# Patient Record
Sex: Male | Born: 1940 | Race: White | Hispanic: No | Marital: Married | Smoking: Never smoker
Health system: Southern US, Community
[De-identification: ages and names within clinical notes are randomized; demographics above are authoritative.]

## PROBLEM LIST (undated history)

## (undated) DIAGNOSIS — I82409 Acute embolism and thrombosis of unspecified deep veins of unspecified lower extremity: Secondary | ICD-10-CM

## (undated) DIAGNOSIS — C801 Malignant (primary) neoplasm, unspecified: Secondary | ICD-10-CM

## (undated) DIAGNOSIS — I4891 Unspecified atrial fibrillation: Secondary | ICD-10-CM

## (undated) DIAGNOSIS — Z77098 Contact with and (suspected) exposure to other hazardous, chiefly nonmedicinal, chemicals: Secondary | ICD-10-CM

## (undated) DIAGNOSIS — Z803 Family history of malignant neoplasm of breast: Secondary | ICD-10-CM

## (undated) DIAGNOSIS — Z800 Family history of malignant neoplasm of digestive organs: Secondary | ICD-10-CM

## (undated) DIAGNOSIS — I2510 Atherosclerotic heart disease of native coronary artery without angina pectoris: Secondary | ICD-10-CM

## (undated) DIAGNOSIS — Z8042 Family history of malignant neoplasm of prostate: Secondary | ICD-10-CM

## (undated) DIAGNOSIS — R0600 Dyspnea, unspecified: Secondary | ICD-10-CM

## (undated) DIAGNOSIS — M4800 Spinal stenosis, site unspecified: Secondary | ICD-10-CM

## (undated) DIAGNOSIS — M1990 Unspecified osteoarthritis, unspecified site: Secondary | ICD-10-CM

## (undated) DIAGNOSIS — C61 Malignant neoplasm of prostate: Secondary | ICD-10-CM

## (undated) DIAGNOSIS — G4730 Sleep apnea, unspecified: Secondary | ICD-10-CM

## (undated) DIAGNOSIS — I499 Cardiac arrhythmia, unspecified: Secondary | ICD-10-CM

## (undated) DIAGNOSIS — Z86010 Personal history of colonic polyps: Secondary | ICD-10-CM

## (undated) HISTORY — DX: Family history of malignant neoplasm of breast: Z80.3

## (undated) HISTORY — DX: Family history of malignant neoplasm of prostate: Z80.42

## (undated) HISTORY — DX: Contact with and (suspected) exposure to other hazardous, chiefly nonmedicinal, chemicals: Z77.098

## (undated) HISTORY — PX: KNEE ARTHROSCOPY: SUR90

## (undated) HISTORY — DX: Family history of malignant neoplasm of digestive organs: Z80.0

## (undated) HISTORY — PX: JOINT REPLACEMENT: SHX530

## (undated) HISTORY — PX: PENILE PROSTHESIS IMPLANT: SHX240

## (undated) HISTORY — DX: Unspecified atrial fibrillation: I48.91

## (undated) HISTORY — PX: HERNIA REPAIR: SHX51

## (undated) HISTORY — PX: BACK SURGERY: SHX140

## (undated) HISTORY — PX: PROSTATECTOMY: SHX69

## (undated) HISTORY — DX: Personal history of colonic polyps: Z86.010

## (undated) HISTORY — DX: Malignant neoplasm of prostate: C61

---

## 2000-05-12 ENCOUNTER — Ambulatory Visit (HOSPITAL_COMMUNITY): Admission: RE | Admit: 2000-05-12 | Discharge: 2000-05-12 | Payer: Self-pay | Admitting: Gastroenterology

## 2000-05-31 ENCOUNTER — Ambulatory Visit (HOSPITAL_COMMUNITY): Admission: RE | Admit: 2000-05-31 | Discharge: 2000-05-31 | Payer: Self-pay | Admitting: Gastroenterology

## 2000-06-13 ENCOUNTER — Ambulatory Visit (HOSPITAL_COMMUNITY): Admission: RE | Admit: 2000-06-13 | Discharge: 2000-06-13 | Payer: Self-pay | Admitting: Gastroenterology

## 2000-06-16 ENCOUNTER — Ambulatory Visit (HOSPITAL_COMMUNITY): Admission: RE | Admit: 2000-06-16 | Discharge: 2000-06-16 | Payer: Self-pay | Admitting: Gastroenterology

## 2000-06-16 ENCOUNTER — Encounter: Payer: Self-pay | Admitting: Gastroenterology

## 2000-07-05 DIAGNOSIS — C801 Malignant (primary) neoplasm, unspecified: Secondary | ICD-10-CM

## 2000-07-05 HISTORY — DX: Malignant (primary) neoplasm, unspecified: C80.1

## 2000-11-12 ENCOUNTER — Ambulatory Visit: Admission: RE | Admit: 2000-11-12 | Discharge: 2001-02-10 | Payer: Self-pay | Admitting: Radiation Oncology

## 2011-06-25 ENCOUNTER — Other Ambulatory Visit (HOSPITAL_COMMUNITY): Payer: Self-pay | Admitting: Orthopaedic Surgery

## 2011-07-01 ENCOUNTER — Encounter (HOSPITAL_COMMUNITY): Payer: Self-pay | Admitting: Pharmacy Technician

## 2011-07-02 ENCOUNTER — Ambulatory Visit (HOSPITAL_COMMUNITY)
Admission: RE | Admit: 2011-07-02 | Discharge: 2011-07-02 | Disposition: A | Discharge status: Home / Self Care | Payer: Medicare Other | Source: Ambulatory Visit | Attending: Orthopaedic Surgery | Admitting: Orthopaedic Surgery

## 2011-07-02 ENCOUNTER — Encounter (HOSPITAL_COMMUNITY): Payer: Self-pay

## 2011-07-02 DIAGNOSIS — Z01810 Encounter for preprocedural cardiovascular examination: Secondary | ICD-10-CM | POA: Insufficient documentation

## 2011-07-02 DIAGNOSIS — Z01818 Encounter for other preprocedural examination: Secondary | ICD-10-CM | POA: Insufficient documentation

## 2011-07-02 DIAGNOSIS — Z01812 Encounter for preprocedural laboratory examination: Secondary | ICD-10-CM | POA: Insufficient documentation

## 2011-07-02 HISTORY — DX: Unspecified osteoarthritis, unspecified site: M19.90

## 2011-07-02 HISTORY — DX: Malignant (primary) neoplasm, unspecified: C80.1

## 2011-07-02 HISTORY — DX: Spinal stenosis, site unspecified: M48.00

## 2011-07-02 LAB — CBC
HCT: 44.7 % (ref 39.0–52.0)
MCH: 31.1 pg (ref 26.0–34.0)
MCHC: 35.1 g/dL (ref 30.0–36.0)
MCV: 88.5 fL (ref 78.0–100.0)
RDW: 13 % (ref 11.5–15.5)

## 2011-07-02 LAB — BASIC METABOLIC PANEL
BUN: 34 mg/dL — ABNORMAL HIGH (ref 6–23)
CO2: 28 mEq/L (ref 19–32)
Calcium: 10.5 mg/dL (ref 8.4–10.5)
Chloride: 101 mEq/L (ref 96–112)
Creatinine, Ser: 1.16 mg/dL (ref 0.50–1.35)

## 2011-07-02 LAB — URINALYSIS, ROUTINE W REFLEX MICROSCOPIC
Bilirubin Urine: NEGATIVE
Glucose, UA: NEGATIVE mg/dL
Hgb urine dipstick: NEGATIVE
Nitrite: NEGATIVE
Specific Gravity, Urine: 1.025 (ref 1.005–1.030)
pH: 5 (ref 5.0–8.0)

## 2011-07-02 LAB — SURGICAL PCR SCREEN: Staphylococcus aureus: NEGATIVE

## 2011-07-02 LAB — ABO/RH: ABO/RH(D): O POS

## 2011-07-02 NOTE — Patient Instructions (Addendum)
[address]  07/02/2011   Your procedure is scheduled on:  07/08/11  Report to [facility] at 5:15 AM.  Call this number if you have problems the morning of surgery: [phone]   Remember:   Do not eat food:After Midnight.  May have clear liquids:until Midnight .  Clear liquids include soda, tea, black coffee, apple or grape juice, broth.  Take these medicines the morning of surgery with A SIP OF WATER: PROSCAR   Do not wear jewelry, make-up or nail polish.  Do not wear lotions, powders, or perfumes. You may wear deodorant.  Do not shave 48 hours prior to surgery.  Do not bring valuables to the hospital.  Contacts, dentures or bridgework may not be worn into surgery.  Leave suitcase in the car. After surgery it may be brought to your room.  For patients admitted to the hospital, checkout time is 11:00 AM the day of discharge.   Patients discharged the day of surgery will not be allowed to drive home.  Name and phone number of your driver:   Special Instructions: CHG Shower Use Special Wash: 1/2 bottle night before surgery and 1/2 bottle morning of surgery.   Please read over the following fact sheets that you were given: MRSA Information

## 2011-07-05 NOTE — Pre-Procedure Instructions (Signed)
Patient surgery moved up to 0700 on 07/08/2011,patient called to let him know of time change and for him to report to Short Stay at [facility] at 0500. Patient verbalized understanding.

## 2011-07-08 ENCOUNTER — Encounter (HOSPITAL_COMMUNITY): Payer: Self-pay | Admitting: Certified Registered Nurse Anesthetist

## 2011-07-08 ENCOUNTER — Encounter (HOSPITAL_COMMUNITY)
Admission: RE | Disposition: A | Discharge status: Home Health | Payer: Self-pay | Source: Ambulatory Visit | Attending: Orthopaedic Surgery

## 2011-07-08 ENCOUNTER — Inpatient Hospital Stay (HOSPITAL_COMMUNITY): Payer: Medicare Other | Admitting: Certified Registered Nurse Anesthetist

## 2011-07-08 ENCOUNTER — Inpatient Hospital Stay (HOSPITAL_COMMUNITY): Payer: Medicare Other

## 2011-07-08 ENCOUNTER — Encounter (HOSPITAL_COMMUNITY): Payer: Self-pay | Admitting: *Deleted

## 2011-07-08 ENCOUNTER — Inpatient Hospital Stay (HOSPITAL_COMMUNITY)
Admission: RE | Admit: 2011-07-08 | Discharge: 2011-07-11 | DRG: 470 | Disposition: A | Discharge status: Home Health | Payer: Medicare Other | Source: Ambulatory Visit | Attending: Orthopaedic Surgery | Admitting: Orthopaedic Surgery

## 2011-07-08 DIAGNOSIS — Z8546 Personal history of malignant neoplasm of prostate: Secondary | ICD-10-CM

## 2011-07-08 DIAGNOSIS — M1610 Unilateral primary osteoarthritis, unspecified hip: Principal | ICD-10-CM | POA: Diagnosis present

## 2011-07-08 DIAGNOSIS — M169 Osteoarthritis of hip, unspecified: Secondary | ICD-10-CM

## 2011-07-08 HISTORY — PX: TOTAL HIP ARTHROPLASTY: SHX124

## 2011-07-08 SURGERY — ARTHROPLASTY, HIP, TOTAL, ANTERIOR APPROACH
Anesthesia: General | Site: Hip | Laterality: Right | Wound class: Clean

## 2011-07-08 MED ORDER — ONDANSETRON HCL 4 MG/2ML IJ SOLN
INTRAMUSCULAR | Status: DC | PRN
  Administered 2011-07-08: 4 mg via INTRAVENOUS

## 2011-07-08 MED ORDER — NALOXONE HCL 0.4 MG/ML IJ SOLN: 0.4000 mg | INTRAMUSCULAR | Status: DC | PRN

## 2011-07-08 MED ORDER — SENNA 8.6 MG PO TABS
1.0000 | ORAL_TABLET | Freq: Two times a day (BID) | ORAL | Status: DC
  Administered 2011-07-08 – 2011-07-11 (×7): 8.6 mg via ORAL
  Filled 2011-07-08 (×7): qty 1

## 2011-07-08 MED ORDER — CEFAZOLIN SODIUM-DEXTROSE 2-3 GM-% IV SOLR
2.0000 g | INTRAVENOUS | Status: AC
  Administered 2011-07-08: 2 g via INTRAVENOUS

## 2011-07-08 MED ORDER — ACETAMINOPHEN 10 MG/ML IV SOLN
INTRAVENOUS | Status: DC | PRN
  Administered 2011-07-08: 1000 mg via INTRAVENOUS

## 2011-07-08 MED ORDER — LACTATED RINGERS IV SOLN
INTRAVENOUS | Status: DC | PRN
  Administered 2011-07-08 (×2): via INTRAVENOUS

## 2011-07-08 MED ORDER — MORPHINE SULFATE (PF) 1 MG/ML IV SOLN
INTRAVENOUS | Status: DC
  Administered 2011-07-08: 1.5 mg via INTRAVENOUS
  Administered 2011-07-09: 4.5 mg via INTRAVENOUS
  Administered 2011-07-09 (×2): 1.5 mg via INTRAVENOUS
  Administered 2011-07-09: 4.5 mg via INTRAVENOUS
  Administered 2011-07-10: 1.5 mg via INTRAVENOUS
  Filled 2011-07-08: qty 25

## 2011-07-08 MED ORDER — ONDANSETRON HCL 4 MG/2ML IJ SOLN: 4.0000 mg | Freq: Four times a day (QID) | INTRAMUSCULAR | Status: DC | PRN

## 2011-07-08 MED ORDER — MIDAZOLAM HCL 5 MG/5ML IJ SOLN
INTRAMUSCULAR | Status: DC | PRN
  Administered 2011-07-08: 2 mg via INTRAVENOUS

## 2011-07-08 MED ORDER — MENTHOL 3 MG MT LOZG
1.0000 | LOZENGE | OROMUCOSAL | Status: DC | PRN
  Filled 2011-07-08: qty 9

## 2011-07-08 MED ORDER — SODIUM CHLORIDE 0.9 % IV SOLN
INTRAVENOUS | Status: DC
  Administered 2011-07-08 – 2011-07-10 (×4): via INTRAVENOUS

## 2011-07-08 MED ORDER — METHOCARBAMOL 100 MG/ML IJ SOLN
500.0000 mg | Freq: Four times a day (QID) | INTRAVENOUS | Status: DC | PRN
  Filled 2011-07-08: qty 5

## 2011-07-08 MED ORDER — 0.9 % SODIUM CHLORIDE (POUR BTL) OPTIME
TOPICAL | Status: DC | PRN
  Administered 2011-07-08: 1000 mL

## 2011-07-08 MED ORDER — FENTANYL CITRATE 0.05 MG/ML IJ SOLN
INTRAMUSCULAR | Status: DC | PRN
  Administered 2011-07-08 (×5): 50 ug via INTRAVENOUS

## 2011-07-08 MED ORDER — SODIUM CHLORIDE 0.9 % IJ SOLN: 9.0000 mL | INTRAMUSCULAR | Status: DC | PRN

## 2011-07-08 MED ORDER — ACETAMINOPHEN 650 MG RE SUPP: 650.0000 mg | Freq: Four times a day (QID) | RECTAL | Status: DC | PRN

## 2011-07-08 MED ORDER — ONDANSETRON HCL 4 MG PO TABS: 4.0000 mg | ORAL_TABLET | Freq: Four times a day (QID) | ORAL | Status: DC | PRN

## 2011-07-08 MED ORDER — HYDROMORPHONE HCL PF 1 MG/ML IJ SOLN: 0.2500 mg | INTRAMUSCULAR | Status: DC | PRN

## 2011-07-08 MED ORDER — DEXAMETHASONE SODIUM PHOSPHATE 10 MG/ML IJ SOLN
INTRAMUSCULAR | Status: DC | PRN
  Administered 2011-07-08: 10 mg via INTRAVENOUS

## 2011-07-08 MED ORDER — HYDROCODONE-ACETAMINOPHEN 5-325 MG PO TABS
1.0000 | ORAL_TABLET | ORAL | Status: DC | PRN
  Administered 2011-07-09: 1 via ORAL
  Filled 2011-07-08: qty 1

## 2011-07-08 MED ORDER — EPHEDRINE SULFATE 50 MG/ML IJ SOLN
INTRAMUSCULAR | Status: DC | PRN
  Administered 2011-07-08: 5 mg via INTRAVENOUS

## 2011-07-08 MED ORDER — ZOLPIDEM TARTRATE 5 MG PO TABS: 5.0000 mg | ORAL_TABLET | Freq: Every evening | ORAL | Status: DC | PRN

## 2011-07-08 MED ORDER — MEPERIDINE HCL 50 MG/ML IJ SOLN: 6.2500 mg | INTRAMUSCULAR | Status: DC | PRN

## 2011-07-08 MED ORDER — MORPHINE SULFATE 2 MG/ML IJ SOLN: 1.0000 mg | INTRAMUSCULAR | Status: DC | PRN

## 2011-07-08 MED ORDER — ACETAMINOPHEN 325 MG PO TABS: 650.0000 mg | ORAL_TABLET | Freq: Four times a day (QID) | ORAL | Status: DC | PRN

## 2011-07-08 MED ORDER — METHOCARBAMOL 500 MG PO TABS: 500.0000 mg | ORAL_TABLET | Freq: Four times a day (QID) | ORAL | Status: DC | PRN

## 2011-07-08 MED ORDER — OXYCODONE HCL 5 MG PO TABS: 5.0000 mg | ORAL_TABLET | ORAL | Status: DC | PRN

## 2011-07-08 MED ORDER — LACTATED RINGERS IV SOLN: INTRAVENOUS | Status: DC

## 2011-07-08 MED ORDER — ROCURONIUM BROMIDE 100 MG/10ML IV SOLN
INTRAVENOUS | Status: DC | PRN
  Administered 2011-07-08: 20 mg via INTRAVENOUS
  Administered 2011-07-08: 50 mg via INTRAVENOUS
  Administered 2011-07-08: 10 mg via INTRAVENOUS

## 2011-07-08 MED ORDER — METOCLOPRAMIDE HCL 10 MG PO TABS: 5.0000 mg | ORAL_TABLET | Freq: Three times a day (TID) | ORAL | Status: DC | PRN

## 2011-07-08 MED ORDER — ALUM & MAG HYDROXIDE-SIMETH 200-200-20 MG/5ML PO SUSP: 30.0000 mL | ORAL | Status: DC | PRN

## 2011-07-08 MED ORDER — NEOSTIGMINE METHYLSULFATE 1 MG/ML IJ SOLN
INTRAMUSCULAR | Status: DC | PRN
  Administered 2011-07-08: 5 mg via INTRAVENOUS

## 2011-07-08 MED ORDER — PROPOFOL 10 MG/ML IV EMUL
INTRAVENOUS | Status: DC | PRN
  Administered 2011-07-08: 150 mg via INTRAVENOUS

## 2011-07-08 MED ORDER — METOCLOPRAMIDE HCL 5 MG/ML IJ SOLN: 5.0000 mg | Freq: Three times a day (TID) | INTRAMUSCULAR | Status: DC | PRN

## 2011-07-08 MED ORDER — GLYCOPYRROLATE 0.2 MG/ML IJ SOLN
INTRAMUSCULAR | Status: DC | PRN
  Administered 2011-07-08: .8 mg via INTRAVENOUS

## 2011-07-08 MED ORDER — CEFAZOLIN SODIUM 1-5 GM-% IV SOLN
1.0000 g | Freq: Four times a day (QID) | INTRAVENOUS | Status: AC
  Administered 2011-07-08 – 2011-07-09 (×3): 1 g via INTRAVENOUS
  Filled 2011-07-08 (×3): qty 50

## 2011-07-08 MED ORDER — FERROUS SULFATE 325 (65 FE) MG PO TABS
325.0000 mg | ORAL_TABLET | Freq: Three times a day (TID) | ORAL | Status: DC
  Administered 2011-07-08 – 2011-07-11 (×9): 325 mg via ORAL
  Filled 2011-07-08 (×11): qty 1

## 2011-07-08 MED ORDER — PHENOL 1.4 % MT LIQD: 1.0000 | OROMUCOSAL | Status: DC | PRN

## 2011-07-08 MED ORDER — LIDOCAINE HCL (CARDIAC) 20 MG/ML IV SOLN
INTRAVENOUS | Status: DC | PRN
  Administered 2011-07-08: 70 mg via INTRAVENOUS

## 2011-07-08 MED ORDER — FINASTERIDE 5 MG PO TABS
5.0000 mg | ORAL_TABLET | Freq: Every day | ORAL | Status: DC
  Administered 2011-07-09 – 2011-07-11 (×3): 5 mg via ORAL
  Filled 2011-07-08 (×4): qty 1

## 2011-07-08 MED ORDER — RIVAROXABAN 10 MG PO TABS
10.0000 mg | ORAL_TABLET | Freq: Every day | ORAL | Status: DC
  Administered 2011-07-09 – 2011-07-11 (×3): 10 mg via ORAL
  Filled 2011-07-08 (×3): qty 1

## 2011-07-08 MED ORDER — DIPHENHYDRAMINE HCL 50 MG/ML IJ SOLN: 12.5000 mg | Freq: Four times a day (QID) | INTRAMUSCULAR | Status: DC | PRN

## 2011-07-08 MED ORDER — PROMETHAZINE HCL 25 MG/ML IJ SOLN: 6.2500 mg | INTRAMUSCULAR | Status: DC | PRN

## 2011-07-08 MED ORDER — DIPHENHYDRAMINE HCL 12.5 MG/5ML PO ELIX: 12.5000 mg | ORAL_SOLUTION | ORAL | Status: DC | PRN

## 2011-07-08 MED ORDER — DIPHENHYDRAMINE HCL 12.5 MG/5ML PO ELIX: 12.5000 mg | ORAL_SOLUTION | Freq: Four times a day (QID) | ORAL | Status: DC | PRN

## 2011-07-08 MED ORDER — HYDROMORPHONE HCL PF 1 MG/ML IJ SOLN
INTRAMUSCULAR | Status: DC | PRN
  Administered 2011-07-08 (×2): 0.5 mg via INTRAVENOUS
  Administered 2011-07-08: 1 mg via INTRAVENOUS

## 2011-07-08 SURGICAL SUPPLY — 38 items
BAG SPEC THK2 15X12 ZIP CLS (MISCELLANEOUS) ×2
BAG ZIPLOCK 12X15 (MISCELLANEOUS) ×4 IMPLANT
BLADE SAW SGTL 18X1.27X75 (BLADE) ×2 IMPLANT
CELLS DAT CNTRL 66122 CELL SVR (MISCELLANEOUS) ×1 IMPLANT
CLOTH BEACON ORANGE TIMEOUT ST (SAFETY) ×2 IMPLANT
DRAPE C-ARM 42X72 X-RAY (DRAPES) ×2 IMPLANT
DRAPE STERI IOBAN 125X83 (DRAPES) ×2 IMPLANT
DRAPE U-SHAPE 47X51 STRL (DRAPES) ×6 IMPLANT
DRSG MEPILEX BORDER 4X8 (GAUZE/BANDAGES/DRESSINGS) ×2 IMPLANT
DURAPREP 26ML APPLICATOR (WOUND CARE) ×2 IMPLANT
ELECT BLADE TIP CTD 4 INCH (ELECTRODE) ×2 IMPLANT
ELECT REM PT RETURN 9FT ADLT (ELECTROSURGICAL) ×2
ELECTRODE REM PT RTRN 9FT ADLT (ELECTROSURGICAL) ×1 IMPLANT
EVACUATOR 1/8 PVC DRAIN (DRAIN) IMPLANT
FACESHIELD LNG OPTICON STERILE (SAFETY) ×8 IMPLANT
GAUZE XEROFORM 1X8 LF (GAUZE/BANDAGES/DRESSINGS) ×2 IMPLANT
GAUZE XEROFORM 4X4 STRL (GAUZE/BANDAGES/DRESSINGS) ×1 IMPLANT
GLOVE BIO SURGEON STRL SZ7 (GLOVE) ×2 IMPLANT
GLOVE BIO SURGEON STRL SZ7.5 (GLOVE) ×2 IMPLANT
GLOVE BIOGEL PI IND STRL 7.5 (GLOVE) IMPLANT
GLOVE BIOGEL PI IND STRL 8 (GLOVE) ×1 IMPLANT
GLOVE BIOGEL PI INDICATOR 7.5 (GLOVE)
GLOVE BIOGEL PI INDICATOR 8 (GLOVE) ×1
GLOVE ECLIPSE 7.0 STRL STRAW (GLOVE) ×2 IMPLANT
GOWN STRL REIN XL XLG (GOWN DISPOSABLE) ×4 IMPLANT
KIT BASIN OR (CUSTOM PROCEDURE TRAY) ×2 IMPLANT
PACK TOTAL JOINT (CUSTOM PROCEDURE TRAY) ×2 IMPLANT
PADDING CAST COTTON 6X4 STRL (CAST SUPPLIES) ×2 IMPLANT
RETRACTOR WND ALEXIS 18 MED (MISCELLANEOUS) ×1 IMPLANT
RTRCTR WOUND ALEXIS 18CM MED (MISCELLANEOUS) ×2
STAPLER SKIN PROX WIDE 3.9 (STAPLE) IMPLANT
SUT ETHIBOND NAB CT1 #1 30IN (SUTURE) ×4 IMPLANT
SUT VIC AB 1 CT1 36 (SUTURE) ×4 IMPLANT
SUT VIC AB 2-0 CT1 27 (SUTURE) ×4
SUT VIC AB 2-0 CT1 TAPERPNT 27 (SUTURE) ×2 IMPLANT
TOWEL OR 17X26 10 PK STRL BLUE (TOWEL DISPOSABLE) ×4 IMPLANT
TOWEL OR NON WOVEN STRL DISP B (DISPOSABLE) ×2 IMPLANT
TRAY FOLEY CATH 14FRSI W/METER (CATHETERS) ×2 IMPLANT

## 2011-07-08 NOTE — Brief Op Note (Signed)
07/08/2011  9:07 AM  PATIENT:  Margorie John  71 y.o. male  PRE-OPERATIVE DIAGNOSIS:  severe osteoarthritis right hip  POST-OPERATIVE DIAGNOSIS:  severe osteoarthritis right hip  PROCEDURE:  Procedure(s): TOTAL HIP ARTHROPLASTY ANTERIOR APPROACH  SURGEON:  Surgeon(s): Kathryne Hitch  PHYSICIAN ASSISTANT:   ASSISTANTS: none   ANESTHESIA:   general  EBL:  Total I/O In: 1000 [I.V.:1000] Out: 450 [Blood:450]  BLOOD ADMINISTERED:none  DRAINS: none   LOCAL MEDICATIONS USED:  NONE  SPECIMEN:  No Specimen  DISPOSITION OF SPECIMEN:  N/A  COUNTS:  YES  TOURNIQUET:  * No tourniquets in log *  DICTATION: .Other Dictation: Dictation Number [id number]  PLAN OF CARE: Admit to inpatient   PATIENT DISPOSITION:  PACU - hemodynamically stable.   Delay start of Pharmacological VTE agent (>24hrs) due to surgical blood loss or risk of bleeding:  {YES/NO/NOT APPLICABLE:20182

## 2011-07-08 NOTE — H&P (Signed)
Connor Duffy is an 71 y.o. male.   Chief Complaint:   Right hip pain with known end-stage osteoarthritis HPI: 71 yo male with severe right hip pain.  X-rays show bone-on-bone wear of his hip with significant DJD.  With the failure of conservative treatment including injections, NSAIDs, rest and time, he wishes to proceed with a right hip replacement.  He understands the risks of blood loss, DVT and PE.  The goal is to decrease his pain and increase his mobility and quality of life.  Past Medical History  Diagnosis Date  . Arthritis   . Cancer     PROSTATE  . Spinal stenosis     Past Surgical History  Procedure Date  . Prostatectomy   . Penile prosthesis implant   . Knee arthroscopy LEFT    History reviewed. No pertinent family history. Social History:  reports that he has never smoked. He does not have any smokeless tobacco history on file. He reports that he drinks alcohol. He reports that he does not use illicit drugs.  Allergies: No Known Allergies  Medications Prior to Admission  Medication Dose Route Frequency Provider Last Rate Last Dose  . ceFAZolin (ANCEF) IVPB 2 g/50 mL premix  2 g Intravenous 60 min Pre-Op Kathryne Hitch       No current outpatient prescriptions on file as of 07/08/2011.    No results found for this or any previous visit (from the past 48 hour(s)). No results found.  Review of Systems  All other systems reviewed and are negative.    Blood pressure 147/94, pulse 67, temperature 97.6 F (36.4 C), temperature source Oral, resp. rate 16, SpO2 98.00%. Physical Exam  Constitutional: He is oriented to person, place, and time. He appears well-developed and well-nourished.  HENT:  Head: Normocephalic and atraumatic.  Eyes: EOM are normal. Pupils are equal, round, and reactive to light.  Neck: Normal range of motion. Neck supple.  Cardiovascular: Normal rate and regular rhythm.   Respiratory: Effort normal and breath sounds normal.  GI: Soft.  Bowel sounds are normal.  Musculoskeletal:       Right hip: He exhibits decreased range of motion, bony tenderness and crepitus.  Neurological: He is alert and oriented to person, place, and time.  Skin: Skin is warm and dry.  Psychiatric: He has a normal mood and affect.     Assessment/Plan To the OR today for a right total hip replacement and then admission as an inpatient.  Sharley Keeler Y 07/08/2011, 6:53 AM

## 2011-07-08 NOTE — Anesthesia Postprocedure Evaluation (Signed)
  Anesthesia Post-op Note  Patient: Connor Duffy  Procedure(s) Performed:  TOTAL HIP ARTHROPLASTY ANTERIOR APPROACH - Right Total Hip Arthroplasty, Direct Anterior Approach   (c-arm)  Patient Location: PACU  Anesthesia Type: General  Level of Consciousness: awake and alert   Airway and Oxygen Therapy: Patient Spontanous Breathing  Post-op Pain: mild  Post-op Assessment: Post-op Vital signs reviewed, Patient's Cardiovascular Status Stable, Respiratory Function Stable, Patent Airway and No signs of Nausea or vomiting  Post-op Vital Signs: stable  Complications: No apparent anesthesia complications

## 2011-07-08 NOTE — Transfer of Care (Signed)
Immediate Anesthesia Transfer of Care Note  Patient: Connor Duffy  Procedure(s) Performed:  TOTAL HIP ARTHROPLASTY ANTERIOR APPROACH - Right Total Hip Arthroplasty, Direct Anterior Approach   (c-arm)  Patient Location: PACU  Anesthesia Type: General  Level of Consciousness: sedated, patient cooperative and responds to stimulaton  Airway & Oxygen Therapy: Patient Spontanous Breathing and Patient connected to face mask oxgen  Post-op Assessment: Report given to PACU RN and Post -op Vital signs reviewed and stable  Post vital signs: Reviewed and stable  Complications: No apparent anesthesia complications

## 2011-07-08 NOTE — Plan of Care (Signed)
Problem: Phase I Progression Outcomes Goal: Pain controlled with appropriate interventions Outcome: Completed/Met Date Met:  07/08/11 Pt has PCA.

## 2011-07-08 NOTE — Progress Notes (Signed)
Utilization review completed.  

## 2011-07-08 NOTE — Anesthesia Preprocedure Evaluation (Addendum)
Anesthesia Evaluation  Patient identified by MRN, date of birth, ID band Patient awake    Reviewed: Allergy & Precautions, H&P , NPO status , Patient's Chart, lab work & pertinent test results, reviewed documented beta blocker date and time   Airway Mallampati: II TM Distance: >3 FB Neck ROM: full    Dental No notable dental hx.    Pulmonary neg pulmonary ROS,  clear to auscultation  Pulmonary exam normal       Cardiovascular Exercise Tolerance: Good neg cardio ROS regular Normal    Neuro/Psych Negative Neurological ROS  Negative Psych ROS   GI/Hepatic negative GI ROS, Neg liver ROS,   Endo/Other  Negative Endocrine ROS  Renal/GU negative Renal ROS  Genitourinary negative   Musculoskeletal   Abdominal   Peds  Hematology negative hematology ROS (+)   Anesthesia Other Findings   Reproductive/Obstetrics negative OB ROS                           Anesthesia Physical Anesthesia Plan  ASA: II  Anesthesia Plan: General   Post-op Pain Management:    Induction:   Airway Management Planned: Oral ETT  Additional Equipment:   Intra-op Plan:   Post-operative Plan:   Informed Consent: I have reviewed the patients History and Physical, chart, labs and discussed the procedure including the risks, benefits and alternatives for the proposed anesthesia with the patient or authorized representative who has indicated his/her understanding and acceptance.   Dental Advisory Given  Plan Discussed with: CRNA  Anesthesia Plan Comments:        Anesthesia Quick Evaluation

## 2011-07-09 LAB — CBC
MCH: 30.9 pg (ref 26.0–34.0)
Platelets: 183 10*3/uL (ref 150–400)
RBC: 4.01 MIL/uL — ABNORMAL LOW (ref 4.22–5.81)
RDW: 13.3 % (ref 11.5–15.5)

## 2011-07-09 LAB — BASIC METABOLIC PANEL
CO2: 27 mEq/L (ref 19–32)
Calcium: 8.5 mg/dL (ref 8.4–10.5)
GFR calc Af Amer: 82 mL/min — ABNORMAL LOW (ref >90)
GFR calc non Af Amer: 71 mL/min — ABNORMAL LOW (ref >90)
Sodium: 131 mEq/L — ABNORMAL LOW (ref 135–145)

## 2011-07-09 NOTE — Progress Notes (Signed)
Physical Therapy Treatment Patient Details Name: Connor Duffy MRN: [id number] DOB: 1941-04-25 Today's Date: 07/09/2011 1327 - 1346; GT PT Assessment/Plan  PT - Assessment/Plan PT Plan: Discharge plan remains appropriate PT Frequency: 7X/week Follow Up Recommendations: Home health PT Equipment Recommended: Rolling walker with 5" wheels PT Goals  Acute Rehab PT Goals PT Goal Formulation: With patient Time For Goal Achievement: 7 days Pt will go Supine/Side to Sit: with supervision Pt will go Sit to Supine/Side: with supervision PT Goal: Sit to Supine/Side - Progress: Progressing toward goal Pt will go Sit to Stand: with supervision PT Goal: Sit to Stand - Progress: Progressing toward goal Pt will go Stand to Sit: with supervision PT Goal: Stand to Sit - Progress: Progressing toward goal Pt will Ambulate: 51 - 150 feet;with supervision PT Goal: Ambulate - Progress: Progressing toward goal Pt will Go Up / Down Stairs: 6-9 stairs;with min assist;with least restrictive assistive device PT Goal: Up/Down Stairs - Progress: Not met  PT Treatment Precautions/Restrictions  Restrictions Other Position/Activity Restrictions: WBAT Mobility (including Balance) Bed Mobility Sit to Supine - Right: 4: Min assist Sit to Supine - Right Details (indicate cue type and reason): pt sel-assisting R LE with LE LE Transfers Sit to Stand: 4: Min assist Sit to Stand Details (indicate cue type and reason): cues for use of UEs and for LE position Stand to Sit: 4: Min assist;5: Supervision Stand to Sit Details: cues for use of UEs and for LE position Ambulation/Gait Ambulation/Gait Assistance: 4: Min assist Ambulation/Gait Assistance Details (indicate cue type and reason): cues for sequence and position from RW Ambulation Distance (Feet): 200 Feet Assistive device: Rolling walker Gait Pattern: Step-to pattern    Exercise    End of Session PT - End of Session Activity Tolerance: Patient tolerated  treatment well Patient left: with call bell in reach;in bed Nurse Communication: Mobility status for ambulation;Mobility status for transfers General Behavior During Session: [facility] for tasks performed  Connor Duffy 07/09/2011, 3:42 PM

## 2011-07-09 NOTE — Progress Notes (Signed)
07/09/2011 Connor Duffy BSN CCM [phone] Pt with dx osteoarthritis rt hip; anterior hip replacemnt on day of admission CM SPOKE WITH PATIENT AND SPOUSE. Plans are for patient to return to his home in [place] where spouse will be caregiver. Already has cane and crutches. Has chosen [place] if hh services are required. CM will follow for dme and HH orders.

## 2011-07-09 NOTE — Progress Notes (Signed)
Subjective: 1 Day Post-Op Procedure(s) (LRB): TOTAL HIP ARTHROPLASTY ANTERIOR APPROACH (Right) Patient reports pain as mild.    Objective: Vital signs in last 24 hours: Temp:  [94.5 F (34.7 C)-99.1 F (37.3 C)] 97.9 F (36.6 C) (01/04 0625) Pulse Rate:  [53-82] 63  (01/04 0625) Resp:  [13-20] 16  (01/04 0625) BP: (121-150)/(64-79) 129/70 mmHg (01/04 0625) SpO2:  [96 %-100 %] 99 % (01/04 0625) Weight:  [100.1 kg (220 lb 10.9 oz)] 220 lb 10.9 oz (100.1 kg) (01/03 1254)  Intake/Output from previous day: 01/03 0701 - 01/04 0700 In: 2771.3 [P.O.:480; I.V.:2291.3] Out: 1475 [Urine:1025; Blood:450] Intake/Output this shift: Total I/O In: 240 [P.O.:240] Out: -    Basename 07/09/11 0444  HGB 12.4*    Basename 07/09/11 0444  WBC 9.5  RBC 4.01*  HCT 35.8*  PLT 183    Basename 07/09/11 0444  NA 131*  K 4.0  CL 97  CO2 27  BUN 23  CREATININE 1.05  GLUCOSE 142*  CALCIUM 8.5   No results found for this basename: LABPT:2,INR:2 in the last 72 hours  Sensation intact distally Intact pulses distally Dorsiflexion/Plantar flexion intact Incision: scant drainage  Assessment/Plan: 1 Day Post-Op Procedure(s) (LRB): TOTAL HIP ARTHROPLASTY ANTERIOR APPROACH (Right) Up with therapy  Errika Narvaiz Y 07/09/2011, 10:07 AM

## 2011-07-09 NOTE — Progress Notes (Signed)
Physical Therapy Evaluation Patient Details Name: Connor Duffy MRN: [id number] DOB: 1941/01/19 Today's Date: 07/09/2011 0847 - 0920; EVAL Problem List:  Patient Active Problem List  Diagnoses  . Degenerative arthritis of hip    Past Medical History:  Past Medical History  Diagnosis Date  . Arthritis   . Cancer     PROSTATE  . Spinal stenosis    Past Surgical History:  Past Surgical History  Procedure Date  . Prostatectomy   . Penile prosthesis implant   . Knee arthroscopy LEFT    PT Assessment/Plan/Recommendation PT Assessment Clinical Impression Statement: Pt with L THR presents with decreased L LE strength/ROM and functiional mobility limitations.  Pt will benefit from skilled PT intervention to maximize IND for d/c home. PT Recommendation/Assessment: Patient will need skilled PT in the acute care venue PT Problem List: Decreased strength;Decreased range of motion;Decreased activity tolerance;Decreased mobility;Decreased knowledge of use of DME;Pain PT Therapy Diagnosis : Difficulty walking PT Plan PT Frequency: 7X/week PT Treatment/Interventions: DME instruction;Gait training;Stair training;Therapeutic exercise;Therapeutic activities;Functional mobility training;Patient/family education PT Recommendation Recommendations for Other Services: OT consult Follow Up Recommendations: Home health PT Equipment Recommended: Rolling walker with 5" wheels PT Goals  Acute Rehab PT Goals PT Goal Formulation: With patient Time For Goal Achievement: 7 days Pt will go Supine/Side to Sit: with supervision PT Goal: Supine/Side to Sit - Progress: Progressing toward goal Pt will go Sit to Supine/Side: with supervision PT Goal: Sit to Supine/Side - Progress: Not met Pt will go Sit to Stand: with supervision PT Goal: Sit to Stand - Progress: Progressing toward goal Pt will go Stand to Sit: with supervision PT Goal: Stand to Sit - Progress: Progressing toward goal Pt will Ambulate: 51 -  150 feet;with supervision PT Goal: Ambulate - Progress: Progressing toward goal Pt will Go Up / Down Stairs: 6-9 stairs;with min assist;with least restrictive assistive device (4 stairs outside, 8 stairs inside with railing) PT Goal: Up/Down Stairs - Progress: Not met  PT Evaluation Precautions/Restrictions  Restrictions Other Position/Activity Restrictions: WBAT Prior Functioning  Home Living Lives With: Spouse Receives Help From: Family Type of Home: House Home Layout: Two level Alternate Level Stairs-Rails: Left Alternate Level Stairs-Number of Steps: 8 Home Access: Stairs to enter Entrance Stairs-Rails: Can reach both Entrance Stairs-Number of Steps: 4 Prior Function Level of Independence: Independent with basic ADLs;Independent with transfers;Independent with gait Able to Take Stairs?: Yes Cognition Cognition Arousal/Alertness: Awake/alert Overall Cognitive Status: Appears within functional limits for tasks assessed Orientation Level: Oriented X4 Sensation/Coordination Coordination Gross Motor Movements are Fluid and Coordinated: Yes Extremity Assessment RUE Assessment RUE Assessment: Within Functional Limits LUE Assessment LUE Assessment: Within Functional Limits RLE Assessment RLE Assessment: Exceptions to [facility] (2+/5 hip; 3+/5 quads) LLE Assessment LLE Assessment: Within Functional Limits Mobility (including Balance) Bed Mobility Bed Mobility: Yes Supine to Sit: 4: Min assist Transfers Transfers: Yes Sit to Stand: 4: Min assist Sit to Stand Details (indicate cue type and reason): cues for use of UEs and for LE position Stand to Sit: 4: Min assist Stand to Sit Details: cues for use of UEs and for LE position Ambulation/Gait Ambulation/Gait: Yes Ambulation/Gait Assistance: 4: Min assist Ambulation/Gait Assistance Details (indicate cue type and reason): cues for posture, sequence and position from RW Ambulation Distance (Feet): 68 Feet Assistive device:  Rolling walker Gait Pattern: Step-to pattern    Exercise  Total Joint Exercises Ankle Circles/Pumps: AROM;10 reps;Supine;Both Quad Sets: AROM;10 reps;Supine;Both Heel Slides: AAROM;10 reps;Supine;Left Hip ABduction/ADduction: AAROM;10 reps;Left;Supine End of Session PT - End  of Session Activity Tolerance: Patient tolerated treatment well Patient left: in chair;with call bell in reach Nurse Communication: Mobility status for ambulation;Mobility status for transfers General Behavior During Session: [facility] for tasks performed  Connor Duffy 07/09/2011, 12:40 PM

## 2011-07-09 NOTE — Op Note (Signed)
NAMELUDWIN, FLAHIVE NO.:  [id number]  MEDICAL RECORD NO.:  [id number]  LOCATION:  1608                         FACILITY:  [facility]  PHYSICIAN:  Vanita Panda. Magnus Ivan, M.D.DATE OF BIRTH:  Oct 31, 1940  DATE OF PROCEDURE:  07/08/2011 DATE OF DISCHARGE:                              OPERATIVE REPORT   PREOPERATIVE DIAGNOSES:  End-stage osteoarthritis and degenerative joint disease, right hip.  POSTOPERATIVE DIAGNOSES:  End-stage osteoarthritis and degenerative joint disease, right hip.  PROCEDURE:  Right total hip arthroplasty through direct anterior approach.  IMPLANTS:  DePuy Sector Gription acetabular component size 54, size 36 +4 neutral polyethylene liner, size 12 Corail femoral component with standard offset, size 36 -2 metal hip ball.  SURGEON:  Vanita Panda. Magnus Ivan, MD  ANESTHESIA:  General.  ANTIBIOTICS:  2 g IV Ancef.  BLOOD LOSS:  450 cc.  COMPLICATIONS:  None.  INDICATIONS:  Mr. Noll is a 71 year old active individual with end- stage arthritis involving his right hip.  He has radiographic evidence of bone on bone wear.  He has tried steroid injections in his hip, antiinflammatory dressing, it has gotten to where it affects his activities of daily living and it has become a debilitating type of pain for him.  He wishes to proceed with a total hip arthroplasty.  The risks and benefits of this have been explained to him in detail and he does wish to proceed with surgery.  PROCEDURE DESCRIPTION:  After informed consent was obtained, appropriate right hip was marked.  He was brought to the operating room and general anesthesia was obtained while he was on the stretcher.  We attempted to place a Foley catheter once, but then we stopped.  He has a previous prostate surgery and a penile implant.  We placed him on the Hana table after traction boots were placed on his feet and a perineal post was placed.  Both feet were inline skeletal traction  with no traction applied.  His right hip was then prepped and draped with DuraPrep and sterile drapes and a time-out was called.  He was identified as the correct patient and correct right hip.  I then made an incision 1 cm distal and 3 cm posterior to the anterior superior iliac spine and carried this obliquely down the leg.  I dissected down to the tensor fascia lata and a soft tissue retractor was placed to divide the skin. I then cut the tensor fascia lata longitudinally to gain direct access to the hip through an anterior approach.  A Cobra retractor was placed under the lateral neck and then up under the rectus femoris.  A medial retractor was placed.  I cauterized the lateral femoral circumflex vessels and then I divided the hip capsule.  I placed the retractors within the hip capsule.  I made my femoral neck cut proximal to the lesser trochanter and placed a corkscrew guide within the femoral head and removed the femoral head in its entirety.  We found significant degenerative changes throughout the femoral head and the acetabulum.  I then cleaned the acetabulum off debris.  A Bent Hohmann was placed medially, and a Cobra under the inferior posterior rim  of the acetabulum.  I cleaned the acetabular debris and then began reaming from a size 44 reamer, all the way up to size 53 with the last 2 reamers 52 and 53 placed under direct fluoroscopic guidance.  I then placed the real size 54 acetabular component and knocked this into place under direct visualization and fluoroscopy.  I then placed the real 36 +4 neutral polyethylene liner.  Next, attention was turned to the femur. All traction again was off the leg.  A temporary hook was placed underneath the vastus ridge.  The leg was externally rotated to 90 degrees, extended, and adducted to allow access to the femoral canal. The retractor was placed underneath the greater trochanter and I released the lateral capsule as well as the  piriformis.  I then used a box cutting guide followed by broaching from a size 8 broach all the way up to a size 12, the 12 was felt to be stable and filling the canal and I trialed a 36 +1.5 femoral head as well as a 36 -2 and the -2 gave him still good stability with equal leg lengths measuring under direct fluoroscopic guidance.  I then removed all trial components and placed the real size 12 femoral component, which was Corail with HA coating.  I placed the real 36 -2 metal hip ball and reduced this into the acetabulum.  It was stable throughout internal and external rotation, with minimal shuck.  I then copiously irrigated the soft tissues with normal saline solution.  We closed the joint capsule with interrupted #1 Ethibond suture followed by a running #1 Vicryl on the tensor fascia lata.  The subcutaneous tissue was closed with interrupted 2-0 Vicryl followed by interrupted staples on the skin.  Xeroform followed by well- padded sterile dressing was applied.  The patient was awakened, extubated, and taken to recovery room in stable condition.  All final counts were correct.  There were no complications noted.     Vanita Panda. Magnus Ivan, M.D.     CYB/MEDQ  D:  07/08/2011  T:  07/08/2011  Job:  [id number]

## 2011-07-10 LAB — CBC
Platelets: 160 10*3/uL (ref 150–400)
RBC: 3.88 MIL/uL — ABNORMAL LOW (ref 4.22–5.81)
WBC: 7.4 10*3/uL (ref 4.0–10.5)

## 2011-07-10 NOTE — Plan of Care (Signed)
Spoke to Dr. To Dr. Magnus Ivan, pt had asked if his iv could be saline locked. Dr. Magnus Ivan d/ced pca and ivf and ordered to saline lock iv. Sheran Luz RN BSN

## 2011-07-10 NOTE — Plan of Care (Signed)
Problem: Phase III Progression Outcomes Goal: Demonstrates TCDB, IS independently Outcome: Progressing Using IS well - reaching 2500

## 2011-07-10 NOTE — Progress Notes (Signed)
Subjective: 2 Days Post-Op Procedure(s) (LRB): TOTAL HIP ARTHROPLASTY ANTERIOR APPROACH (Right) Patient reports pain as mild.    Objective: Vital signs in last 24 hours: Temp:  [97.9 F (36.6 C)-98.7 F (37.1 C)] 98.7 F (37.1 C) (01/05 0559) Pulse Rate:  [60-71] 66  (01/05 0559) Resp:  [16-18] 18  (01/05 0559) BP: (125-165)/(68-72) 165/68 mmHg (01/05 0559) SpO2:  [95 %-100 %] 95 % (01/05 0559)  Intake/Output from previous day: 01/04 0701 - 01/05 0700 In: 2360 [P.O.:1460; I.V.:900] Out: 2600 [Urine:2600] Intake/Output this shift: Total I/O In: -  Out: 350 [Urine:350]   Basename 07/10/11 0435 07/09/11 0444  HGB 12.1* 12.4*    Basename 07/10/11 0435 07/09/11 0444  WBC 7.4 9.5  RBC 3.88* 4.01*  HCT 34.9* 35.8*  PLT 160 183    Basename 07/09/11 0444  NA 131*  K 4.0  CL 97  CO2 27  BUN 23  CREATININE 1.05  GLUCOSE 142*  CALCIUM 8.5   No results found for this basename: LABPT:2,INR:2 in the last 72 hours  Sensation intact distally Intact pulses distally Dorsiflexion/Plantar flexion intact Incision: scant drainage  Assessment/Plan: 2 Days Post-Op Procedure(s) (LRB): TOTAL HIP ARTHROPLASTY ANTERIOR APPROACH (Right) Plan for discharge tomorrow  Connor Duffy 07/10/2011, 11:08 AM

## 2011-07-10 NOTE — Progress Notes (Signed)
Physical Therapy Treatment Patient Details Name: Connor Duffy MRN: [id number] DOB: 1941-04-10 Today's Date: 07/10/2011 1410 - 1445; GT, TA PT Assessment/Plan  PT - Assessment/Plan PT Plan: Discharge plan remains appropriate PT Frequency: 7X/week Follow Up Recommendations: Home health PT Equipment Recommended: Rolling walker with 5" wheels PT Goals  Acute Rehab PT Goals Time For Goal Achievement: 7 days Pt will go Supine/Side to Sit: with supervision PT Goal: Supine/Side to Sit - Progress: Progressing toward goal Pt will go Sit to Supine/Side: with supervision PT Goal: Sit to Supine/Side - Progress: Progressing toward goal Pt will go Sit to Stand: with supervision PT Goal: Sit to Stand - Progress: Progressing toward goal Pt will go Stand to Sit: with supervision PT Goal: Stand to Sit - Progress: Progressing toward goal Pt will Ambulate: 51 - 150 feet;with supervision PT Goal: Ambulate - Progress: Progressing toward goal Pt will Go Up / Down Stairs: 6-9 stairs;with min assist;with least restrictive assistive device PT Goal: Up/Down Stairs - Progress: Progressing toward goal  PT Treatment Precautions/Restrictions  Restrictions Other Position/Activity Restrictions: WBAT Mobility (including Balance) Bed Mobility Sit to Supine - Right: 5: Supervision Sit to Supine - Right Details (indicate cue type and reason): cues for technique to self assist Transfers Sit to Stand: 5: Supervision;With armrests;With upper extremity assist;From chair/3-in-1 Stand to Sit: 5: Supervision;With upper extremity assist;To bed Stand to Sit Details: cues for use of UEs Ambulation/Gait Ambulation/Gait Assistance: 5: Supervision;4: Min assist Ambulation/Gait Assistance Details (indicate cue type and reason): cues for posture and ER on R Ambulation Distance (Feet): 240 Feet (x2) Assistive device: Rolling walker Gait Pattern: Step-to pattern Stairs: Yes Stairs Assistance: 4: Min assist Stairs Assistance  Details (indicate cue type and reason): cues for sequence and foot/crutch placement Stair Management Technique: One rail Right;Step to pattern;Forwards;With crutches Number of Stairs: 4  (performed twice)    Exercise    End of Session PT - End of Session Equipment Utilized During Treatment: Gait belt Activity Tolerance: Patient tolerated treatment well Patient left: in bed;with call bell in reach Nurse Communication: Mobility status for ambulation;Mobility status for transfers General Behavior During Session: [facility] for tasks performed  Connor Duffy 07/10/2011, 2:58 PM

## 2011-07-10 NOTE — Progress Notes (Signed)
Physical Therapy Treatment Patient Details Name: Connor Duffy MRN: [id number] DOB: [birth year] Today's Date: 07/10/2011 0865 - 7846 PT Assessment/Plan  PT - Assessment/Plan PT Plan: Discharge plan remains appropriate PT Frequency: 7X/week Follow Up Recommendations: Home health PT Equipment Recommended: Rolling walker with 5" wheels PT Goals  Acute Rehab PT Goals PT Goal Formulation: With patient Time For Goal Achievement: 7 days Pt will go Supine/Side to Sit: with supervision PT Goal: Supine/Side to Sit - Progress: Progressing toward goal Pt will go Sit to Supine/Side: with supervision Pt will go Sit to Stand: with supervision PT Goal: Sit to Stand - Progress: Progressing toward goal Pt will go Stand to Sit: with supervision PT Goal: Stand to Sit - Progress: Progressing toward goal Pt will Ambulate: 51 - 150 feet;with supervision PT Goal: Ambulate - Progress: Progressing toward goal Pt will Go Up / Down Stairs: 6-9 stairs;with min assist;with least restrictive assistive device PT Goal: Up/Down Stairs - Progress: Not met  PT Treatment Precautions/Restrictions  Restrictions Other Position/Activity Restrictions: WBAT Mobility (including Balance) Bed Mobility Supine to Sit: 4: Min assist Transfers Sit to Stand: 4: Min assist;Without upper extremity assist;From bed Sit to Stand Details (indicate cue type and reason): cues for use of UEs Stand to Sit: 4: Min assist;5: Supervision;To chair/3-in-1;With armrests;With upper extremity assist Stand to Sit Details: cues for use of UEs Ambulation/Gait Ambulation/Gait Assistance: 5: Supervision;4: Min assist Ambulation/Gait Assistance Details (indicate cue type and reason): cues for position from RW Ambulation Distance (Feet): 240 Feet Assistive device: Rolling walker Gait Pattern: Step-to pattern    Exercise  Total Joint Exercises Ankle Circles/Pumps: AROM;Both;20 reps;Supine Gluteal Sets: 10 reps;5 reps;Supine;AROM;Both Short Arc  Quad: AROM;5 reps;10 reps;Supine;Right Heel Slides: AAROM;20 reps;Supine;Right Hip ABduction/ADduction: AAROM;Right;20 reps;Supine End of Session PT - End of Session Activity Tolerance: Patient tolerated treatment well Patient left: in chair;with call bell in reach Nurse Communication: Mobility status for ambulation;Mobility status for transfers General Behavior During Session: [facility] for tasks performed  Roshni Burbano 07/10/2011, 1:21 PM

## 2011-07-11 LAB — CBC
HCT: 33.4 % — ABNORMAL LOW (ref 39.0–52.0)
Hemoglobin: 11.6 g/dL — ABNORMAL LOW (ref 13.0–17.0)
RBC: 3.77 MIL/uL — ABNORMAL LOW (ref 4.22–5.81)
WBC: 7.1 10*3/uL (ref 4.0–10.5)

## 2011-07-11 MED ORDER — OXYCODONE-ACETAMINOPHEN 5-325 MG PO TABS: 1.0000 | ORAL_TABLET | ORAL | Status: AC | PRN

## 2011-07-11 MED ORDER — RIVAROXABAN 10 MG PO TABS: 10.0000 mg | ORAL_TABLET | Freq: Every day | ORAL | Status: DC

## 2011-07-11 MED ORDER — METHOCARBAMOL 500 MG PO TABS: 500.0000 mg | ORAL_TABLET | Freq: Four times a day (QID) | ORAL | Status: AC | PRN

## 2011-07-11 NOTE — Progress Notes (Signed)
Cm spoke with pt concerning d/c planning. Per pt [facility] to provide HHPT. DME provided by [facility] delivered to room on 05/09/12. Pt states no other HH  Or DME needs. Pt's spouse to provide transportation home and to assist with home care.    Connor Duffy [phone]

## 2011-07-11 NOTE — Progress Notes (Signed)
Physical Therapy Treatment Patient Details Name: Connor Duffy MRN: [id number] DOB: [birth year] Today's Date: 07/11/2011 920-943 hm e PT Assessment/Plan  PT - Assessment/Plan Comments on Treatment Session: pt states he does not need to practice stairs again,nor have PT ambulate with him. Reviewed exercises, also suggested he try using the crutches vs. RW when HHPT comes, if he desires, as pt is progressing rapidly.  pt is ready for discharge PT Plan: Discharge plan remains appropriate PT Frequency: 7X/week Follow Up Recommendations: Home health PT Equipment Recommended: Rolling walker with 5" wheels PT Goals  Acute Rehab PT Goals PT Goal Formulation: With patient Time For Goal Achievement: 7 days Pt will go Supine/Side to Sit: with supervision PT Goal: Supine/Side to Sit - Progress: Progressing toward goal Pt will go Sit to Supine/Side: with supervision PT Goal: Sit to Supine/Side - Progress: Progressing toward goal Pt will go Sit to Stand: with supervision PT Goal: Sit to Stand - Progress: Progressing toward goal Pt will go Stand to Sit: with supervision PT Goal: Stand to Sit - Progress: Progressing toward goal Pt will Ambulate: 51 - 150 feet;with supervision PT Goal: Ambulate - Progress: Progressing toward goal Pt will Go Up / Down Stairs: 6-9 stairs;with min assist;with least restrictive assistive device PT Goal: Up/Down Stairs - Progress: Progressing toward goal  PT Treatment Precautions/Restrictions  Restrictions Weight Bearing Restrictions: No Other Position/Activity Restrictions: WBAT Mobility (including Balance) Bed Mobility Bed Mobility: No (pt declined need to get up. ststes wife will come and assist)    Exercise  Total Joint Exercises Ankle Circles/Pumps: AROM;Right;20 reps;Supine (demonstrated to pt use of sheet to assist with the exercises) Quad Sets: AROM;Right;10 reps Heel Slides: AROM;Right;20 reps;Supine Hip ABduction/ADduction: AAROM;Right;20 reps;Supine End  of Session PT - End of Session Activity Tolerance: Patient tolerated treatment well Patient left: in bed;with call bell in reach;with family/visitor present  Rada Hay 07/11/2011, 9:53 AM

## 2011-07-11 NOTE — Discharge Summary (Signed)
Physician Discharge Summary  Patient ID: Connor Duffy MRN: [id number] DOB/AGE: 1940/11/17 71 y.o.  Admit date: 07/08/2011 Discharge date: 07/11/2011  Admission Diagnoses:  Degenerative arthritis of hip  Discharge Diagnoses:  Principal Problem:  *Degenerative arthritis of hip   Past Medical History  Diagnosis Date  . Arthritis   . Cancer     PROSTATE  . Spinal stenosis     Surgeries: Procedure(s): TOTAL HIP ARTHROPLASTY ANTERIOR APPROACH on 07/08/2011   Consultants (if any):    Discharged Condition: Improved  Hospital Course: Connor Duffy is an 71 y.o. male who was admitted 07/08/2011 with a diagnosis of Degenerative arthritis of hip and went to the operating room on 07/08/2011 and underwent the above named procedures.    He was given perioperative antibiotics:  Anti-infectives     Start     Dose/Rate Route Frequency Ordered Stop   07/08/11 1330   ceFAZolin (ANCEF) IVPB 1 g/50 mL premix        1 g 100 mL/hr over 30 Minutes Intravenous Every 6 hours 07/08/11 1214 07/09/11 0159   07/08/11 0515   ceFAZolin (ANCEF) IVPB 2 g/50 mL premix        2 g 100 mL/hr over 30 Minutes Intravenous 60 min pre-op 07/08/11 0504 07/08/11 0714        .  He was given sequential compression devices, early ambulation, and chemoprophylaxis for DVT prophylaxis.  He benefited maximally from their hospital stay and there were no complications.    Recent vital signs:  Filed Vitals:   07/11/11 0547  BP: 137/78  Pulse: 66  Temp: 98.2 F (36.8 C)  Resp: 16    Recent laboratory studies:  Lab Results  Component Value Date   HGB 11.6* 07/11/2011   HGB 12.1* 07/10/2011   HGB 12.4* 07/09/2011   Lab Results  Component Value Date   WBC 7.1 07/11/2011   PLT 172 07/11/2011   No results found for this basename: INR   Lab Results  Component Value Date   NA 131* 07/09/2011   K 4.0 07/09/2011   CL 97 07/09/2011   CO2 27 07/09/2011   BUN 23 07/09/2011   CREATININE 1.05 07/09/2011   GLUCOSE 142* 07/09/2011     Discharge Medications:   Current Discharge Medication List    START taking these medications   Details  methocarbamol (ROBAXIN) 500 MG tablet Take 1 tablet (500 mg total) by mouth every 6 (six) hours as needed. Qty: 60 tablet, Refills: 0    oxyCODONE-acetaminophen (ROXICET) 5-325 MG per tablet Take 1-2 tablets by mouth every 4 (four) hours as needed for pain. Qty: 60 tablet, Refills: 0    rivaroxaban (XARELTO) 10 MG TABS tablet Take 1 tablet (10 mg total) by mouth daily with breakfast. Qty: 20 tablet, Refills: 0      CONTINUE these medications which have NOT CHANGED   Details  finasteride (PROSCAR) 5 MG tablet Take 5 mg by mouth daily.       STOP taking these medications     ibuprofen (ADVIL,MOTRIN) 200 MG tablet      Glucosamine HCl 1000 MG TABS         Diagnostic Studies: Dg Chest 2 View  07/02/2011  *RADIOLOGY REPORT*  Clinical Data: Preop for hip surgery.  CHEST - 2 VIEW  Comparison: None.  Findings: The heart size is normal.  The lungs are slightly hyperexpanded.  No focal airspace disease is evident.  The visualized soft tissues and bony thorax are unremarkable apart from  mild degenerative changes in the thoracic spine.  IMPRESSION:  1.  Mild emphysematous change. 2.  No acute cardiopulmonary disease.  Original Report Authenticated By: Jamesetta Orleans. MATTERN, M.D.   Dg Hip Complete Right  07/08/2011  *RADIOLOGY REPORT*  Clinical Data: Right total hip arthroplasty.  RIGHT HIP - COMPLETE 2+ VIEW  Comparison: None.  Findings: The femoral and acetabular components are well seated without complicating features.  IMPRESSION: Well seated components of a total hip arthroplasty without complicating features.  Original Report Authenticated By: P. Loralie Champagne, M.D.   Dg Pelvis Portable  07/08/2011  *RADIOLOGY REPORT*  Clinical Data: Postop right hip replacement  PORTABLE PELVIS  Comparison: None.  Findings: There is a right total hip prosthesis now which is anatomically aligned  in this view.  No evidence of acute fracture or peri hardware lucency.  Postoperative subcutaneous gas is noted. Postsurgical changes in the pelvis are present.  IMPRESSION: New right total hip prosthesis is anatomically aligned in this view.  Original Report Authenticated By: Brandon Melnick, M.D.   Dg Hip Portable 1 View Right  07/08/2011  *RADIOLOGY REPORT*  Clinical Data: Status post right total hip replacement  PORTABLE RIGHT HIP - 1 VIEW  Comparison: Earlier today at 9:44  Findings: A new right total hip prosthesis is in place with anatomic lateral alignment.  No evidence of fracture.  Postsurgical gas and staples are noted.  IMPRESSION: Normal lateral alignment of new right total hip prosthesis.  Original Report Authenticated By: Brandon Melnick, M.D.   Dg C-arm 61-120 Min-no Report  07/08/2011  CLINICAL DATA: hip film   C-ARM 61-120 MINUTES  Fluoroscopy was utilized by the requesting physician.  No radiographic  interpretation.      Disposition: Discharge to home       Signed: Kathryne Hitch 07/11/2011, 8:50 AM

## 2011-07-11 NOTE — Progress Notes (Signed)
Patient discharged to home. DC instructions given with wife at bedside. No concerns voiced. Prescriptions x 3 given. Wife instructed on dsg change procedure. Voices understanding. Pt left unit in wheelchair pushed by nurse tech. Left in good condition.

## 2011-07-12 ENCOUNTER — Encounter (HOSPITAL_COMMUNITY): Payer: Self-pay | Admitting: Orthopaedic Surgery

## 2013-07-31 ENCOUNTER — Encounter: Payer: Self-pay | Admitting: *Deleted

## 2013-07-31 ENCOUNTER — Encounter: Payer: Self-pay | Admitting: Interventional Cardiology

## 2013-07-31 DIAGNOSIS — C801 Malignant (primary) neoplasm, unspecified: Secondary | ICD-10-CM | POA: Insufficient documentation

## 2013-07-31 DIAGNOSIS — M1990 Unspecified osteoarthritis, unspecified site: Secondary | ICD-10-CM | POA: Insufficient documentation

## 2013-07-31 DIAGNOSIS — I4891 Unspecified atrial fibrillation: Secondary | ICD-10-CM | POA: Insufficient documentation

## 2013-07-31 DIAGNOSIS — M4800 Spinal stenosis, site unspecified: Secondary | ICD-10-CM | POA: Insufficient documentation

## 2013-08-10 ENCOUNTER — Ambulatory Visit: Payer: Medicare Other | Admitting: Cardiology

## 2013-08-27 ENCOUNTER — Encounter (INDEPENDENT_AMBULATORY_CARE_PROVIDER_SITE_OTHER): Payer: Self-pay

## 2013-08-27 ENCOUNTER — Ambulatory Visit (INDEPENDENT_AMBULATORY_CARE_PROVIDER_SITE_OTHER): Payer: Medicare Other | Admitting: Cardiology

## 2013-08-27 ENCOUNTER — Encounter: Payer: Self-pay | Admitting: Cardiology

## 2013-08-27 VITALS — BP 128/80 | HR 60 | Ht 71.0 in | Wt 200.0 lb

## 2013-08-27 DIAGNOSIS — I4891 Unspecified atrial fibrillation: Secondary | ICD-10-CM

## 2013-08-27 NOTE — Progress Notes (Signed)
[place]. [address], [address] [place], [place]  [address] Phone: [phone] Fax:  [phone]  Date:  08/27/2013   ID:  Connor Duffy, DOB 16-Dec-1940, MRN [id number]  PCP:  No primary provider on file.   History of Present Illness: Connor Duffy is a 72 y.o. male new onset dyspnea on exertion, fatigue, lightheadedness that was unusual for him during one of his extensive hiking trips. He had his wife go ahead as he continued at a slower pace. Denied any chest discomfort but he felt some shortness of breath and unsettling sensation that was unusual for him. He does not drink significant alcohol, no recent decongestants, no recent surgeries, fevers, chills, cough. No recent skin or hair changes. He was sent over for an exercise treadmill test by Dr. Baldemar Lenis.  Interestingly, as we were obtaining the original EKG, he demonstrated atrial fibrillation with rapid ventricular response, heart rate of approximate 115 beats per minute. This was a new diagnosis for him. He said he felt perhaps slightly lightheaded here in our office. While I was talking with him then auscultating him, he felt once again regular and sure enough, he was back in sinus rhythm. We then performed treadmill test which overall was low risk, 10 minutes, occasional PVCs/couplets but no ST segment changes indicative of ischemia. 12 METs of activity. His wife, [name] was present.   We discussed at length the implications of atrial fibrillation. CHADS-VAS is 1. Age only. Aspirin he is taking. We discussed the risks of stroke with atrial fibrillation. We discussed the possibilities of antiarrhythmic therapy and even touched on ablation therapy which I stated would be only if he were to remain highly symptomatic especially with antiarrhythmic medications. We decided to initiate low-dose diltiazem 120 mg.   08/27/13-told me about his amazing avenger hiking across [employer] 250 miles. 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 No strokelike symptoms. No chest pain.   Wt Readings from Last 3 Encounters:  08/27/13 200 lb (90.719 kg)  07/08/11 220 lb 10.9 oz (100.1 kg)  07/08/11 220 lb 10.9 oz (100.1 kg)     Past Medical History  Diagnosis Date  . Arthritis   . Cancer     PROSTATE  . Spinal stenosis   . Atrial fibrillation     Past Surgical History  Procedure Laterality Date  . Prostatectomy    . Penile prosthesis implant    . Knee arthroscopy  LEFT  . Total hip arthroplasty  07/08/2011    Procedure: TOTAL HIP ARTHROPLASTY ANTERIOR APPROACH;  Surgeon: Mcarthur Rossetti;  Location: [facility];  Service: Orthopedics;  Laterality: Right;  Right Total Hip Arthroplasty, Direct Anterior Approach   (c-arm)    Current Outpatient Prescriptions  Medication Sig Dispense Refill  . diltiazem (CARDIZEM LA) 120 MG 24 hr tablet Take 120 mg by mouth daily.      . finasteride (PROSCAR) 5 MG tablet Take 5 mg by mouth daily.        No current facility-administered medications for this visit.    Allergies:   No Known Allergies  Social History:  The patient  reports that he has never smoked. He does not have any smokeless tobacco history on file. He reports that he drinks alcohol. He reports that he does not use illicit drugs.   ROS:  Please see the history of present illness.   Denies any fevers, chills, orthopnea, PND, strokelike symptoms    PHYSICAL EXAM: VS:  Ht 5\' 11"  (1.803 m)  Wt 200 lb (90.719 kg)  BMI 27.91 kg/m2 Well nourished, well developed, in no acute distress HEENT: normal Neck: no JVD Cardiac:  normal S1, S2; RRR; no murmur Lungs:  clear to auscultation bilaterally, no wheezing, rhonchi or rales Abd: soft, nontender, no hepatomegaly Ext: no edema Skin: warm and dry Neuro: no focal abnormalities noted  EKG:  None today   Echocardiogram 01/26/13 -Normal ejection fraction, mild left atrial enlargement.   ASSESSMENT AND PLAN:  1. Paroxysmal atrial fibrillation - currently  doing very well. CHADS-VAS is 1. Age only. He is very active.Doing very well. No changes made. We discussed at length possibility of anticoagulation once he hits 77. 2. One year follow up  Signed, Candee Furbish, MD [facility]  08/27/2013 11:36 AM

## 2013-08-27 NOTE — Patient Instructions (Signed)
Your physician recommends that you continue on your current medications as directed. Please refer to the Current Medication list given to you today.  Your physician wants you to follow-up in: 1 year with Dr. Skains. You will receive a reminder letter in the mail two months in advance. If you don't receive a letter, please call our office to schedule the follow-up appointment.  

## 2013-11-15 ENCOUNTER — Telehealth: Payer: Self-pay | Admitting: Cardiology

## 2013-11-15 NOTE — Telephone Encounter (Signed)
New message      Talk to the nurse----[facility] ask a question [facility]

## 2013-11-19 ENCOUNTER — Telehealth: Payer: Self-pay | Admitting: Cardiology

## 2013-11-19 NOTE — Telephone Encounter (Signed)
LVM for patient to call the office

## 2013-11-19 NOTE — Telephone Encounter (Signed)
New message ° ° ° ° ° ° ° ° ° °Pt returning nurses call °

## 2013-11-22 NOTE — Telephone Encounter (Signed)
See previous telephone note. 

## 2013-11-22 NOTE — Telephone Encounter (Signed)
Spoke with patient,  Patient was concerned about needing to restart ASA, patient had a GI Bleeding issue previously, Based on current upper GI everything looked good. Wanted to know if he should restart ASA. Dr. Marlou Porch  Wants to old ASA for now and re-visit in a few years. Patient is aware.

## 2014-10-07 DIAGNOSIS — C61 Malignant neoplasm of prostate: Secondary | ICD-10-CM | POA: Diagnosis not present

## 2014-10-14 DIAGNOSIS — J069 Acute upper respiratory infection, unspecified: Secondary | ICD-10-CM | POA: Diagnosis not present

## 2014-10-14 DIAGNOSIS — C61 Malignant neoplasm of prostate: Secondary | ICD-10-CM | POA: Diagnosis not present

## 2014-11-18 DIAGNOSIS — R319 Hematuria, unspecified: Secondary | ICD-10-CM | POA: Diagnosis not present

## 2014-11-18 DIAGNOSIS — N3090 Cystitis, unspecified without hematuria: Secondary | ICD-10-CM | POA: Diagnosis not present

## 2014-12-30 ENCOUNTER — Other Ambulatory Visit: Payer: Self-pay

## 2015-03-11 ENCOUNTER — Encounter: Payer: Self-pay | Admitting: Cardiology

## 2015-03-14 ENCOUNTER — Ambulatory Visit (INDEPENDENT_AMBULATORY_CARE_PROVIDER_SITE_OTHER): Payer: Medicare Other | Admitting: Cardiology

## 2015-03-14 ENCOUNTER — Encounter: Payer: Self-pay | Admitting: Cardiology

## 2015-03-14 VITALS — BP 120/76 | HR 65 | Ht 71.0 in | Wt 192.8 lb

## 2015-03-14 DIAGNOSIS — R0789 Other chest pain: Secondary | ICD-10-CM | POA: Diagnosis not present

## 2015-03-14 DIAGNOSIS — R0600 Dyspnea, unspecified: Secondary | ICD-10-CM

## 2015-03-14 DIAGNOSIS — I480 Paroxysmal atrial fibrillation: Secondary | ICD-10-CM | POA: Diagnosis not present

## 2015-03-14 NOTE — Patient Instructions (Signed)
Medication Instructions:  The current medical regimen is effective;  continue present plan and medications.  Testing/Procedures: Your physician has requested that you have a myoview. For further information please visit [email or web address]. Please follow instruction sheet, as given.  Follow-Up: Follow up in 1 year with Dr. Skains.  You will receive a letter in the mail 2 months before you are due.  Please call us when you receive this letter to schedule your follow up appointment.  Thank you for choosing [facility]!!     

## 2015-03-14 NOTE — Progress Notes (Signed)
[place]. [address] [place], [place]  [address] Phone: [phone] Fax:  [phone]  Date:  03/14/2015   ID:  Benjaman Artman, DOB 04-24-1941, MRN [id number]  PCP:  Alannie Amodio NOT IN SYSTEM   History of Present Illness: Connor Duffy is a 74 y.o. male new onset dyspnea on exertion, fatigue, lightheadedness that was unusual for him during one of his extensive hiking trips. He had his wife go ahead as he continued at a slower pace. Denied any chest discomfort but he felt some shortness of breath and unsettling sensation that was unusual for him. He does not drink significant alcohol, no recent decongestants, no recent surgeries, fevers, chills, cough. No recent skin or hair changes. He was sent over for an exercise treadmill test by Dr. Baldemar Lenis.  Interestingly, as we were obtaining the original EKG, he demonstrated atrial fibrillation with rapid ventricular response, heart rate of approximate 115 beats per minute. This was a new diagnosis for him. He said he felt perhaps slightly lightheaded here in our office. While I was talking with him then auscultating him, he felt once again regular and sure enough, he was back in sinus rhythm. We then performed treadmill test which overall was low risk, 10 minutes, occasional PVCs/couplets but no ST segment changes indicative of ischemia. 12 METs of activity. His wife, [name] was present.   We discussed at length the implications of atrial fibrillation. CHADS-VAS is 1. Age only. Aspirin he is taking. We discussed the risks of stroke with atrial fibrillation. We discussed the possibilities of antiarrhythmic therapy and even touched on ablation therapy which I stated would be only if he were to remain highly symptomatic especially with antiarrhythmic medications. We decided to initiate low-dose diltiazem 120 mg.   08/27/13-told me about his amazing avenger hiking [place] [employer] 250 miles. 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 No strokelike symptoms. No chest pain.  03/14/15 - feeling atypical chest pain-mostly a back heaviness when walking occasionally, also increased dyspnea on exertion. His brother recently had myocardial infarction. He is concerned about the possibility of coronary artery disease. Dyspnea most often with elevation hikes. Feels 80% of prior stamina.    Wt Readings from Last 3 Encounters:  03/14/15 192 lb 12.8 oz (87.454 kg)  08/27/13 200 lb (90.719 kg)  07/08/11 220 lb 10.9 oz (100.1 kg)     Past Medical History  Diagnosis Date  . Arthritis   . Cancer     PROSTATE  . Spinal stenosis   . Atrial fibrillation     Past Surgical History  Procedure Laterality Date  . Prostatectomy    . Penile prosthesis implant    . Knee arthroscopy  LEFT  . Total hip arthroplasty  07/08/2011    Procedure: TOTAL HIP ARTHROPLASTY ANTERIOR APPROACH;  Surgeon: Mcarthur Rossetti;  [facility]: [facility];  Service: Orthopedics;  Laterality: Right;  Right Total Hip Arthroplasty, Direct Anterior Approach   (c-arm)    Current Outpatient Prescriptions  Medication Sig Dispense Refill  . diltiazem (CARDIZEM LA) 120 MG 24 hr tablet Take 120 mg by mouth daily.     No current facility-administered medications for this visit.    Allergies:   No Known Allergies  Social History:  The patient  reports that he has never smoked. He does not have any smokeless tobacco history on file. He reports that he drinks alcohol. He reports that he does not use illicit drugs.  ROS:  Please see the history of present illness.   Denies any fevers, chills, orthopnea, PND, strokelike symptoms    PHYSICAL EXAM: VS:  BP 120/76 mmHg  Pulse 65  Ht 5\' 11"  (1.803 m)  Wt 192 lb 12.8 oz (87.454 kg)  BMI 26.90 kg/m2 Well nourished, well developed, in no acute distress HEENT: normal Neck: no JVD Cardiac:  normal S1, S2; RRR; no murmur Lungs:  clear to auscultation bilaterally, no wheezing, rhonchi or rales Abd: soft,  nontender, no hepatomegaly Ext: no edema Skin: warm and dry Neuro: no focal abnormalities noted  EKG:  Today: 03/14/15-sinus rhythm, borderline first-degree AV block, PVC noted personally viewed-NSR 68, PVC, IRBBB, NSSTW changes.     Echocardiogram 01/26/13 -Normal ejection fraction, mild left atrial enlargement.   [facility]   ASSESSMENT AND PLAN:  1. Paroxysmal atrial fibrillation - currently doing very well. CHADS-VAS is 1. Age only. He is very active.Doing very well. No changes made. We discussed at length possibility of anticoagulation once he hits 77. 2. Dyspnea - felt weight on back, more significant dyspnea than usual when walking, atypical chest pain. Given his brother recently had myocardial infarction, I will go ahead and order a nuclear stress test. I've also asked him to print off his [facility] lab work and bring them to me for examination. He had postop anemia after hip surgery however this is fairly routine. Hopefully his hemoglobin is fine currently. Of course anemia can be a cause of dyspnea as well. If symptoms worsen or become more worrisome, further cardiac testing may be warranted. 3. Grieving-loss of their son. 4. PVC's - noted on ECG - diltiazem. 5. One year follow up  Signed, Candee Furbish, MD [facility]  03/14/2015 10:48 AM

## 2015-03-19 ENCOUNTER — Telehealth (HOSPITAL_COMMUNITY): Payer: Self-pay

## 2015-03-19 NOTE — Telephone Encounter (Signed)
Left message on voicemail in reference to upcoming appointment scheduled for 03-24-2015. Phone number given for a call back so details instructions can be given. Connor Duffy, Shamera Yarberry A

## 2015-03-20 ENCOUNTER — Telehealth (HOSPITAL_COMMUNITY): Payer: Self-pay | Admitting: *Deleted

## 2015-03-20 NOTE — Telephone Encounter (Signed)
Patient given detailed instructions per Myocardial Perfusion Study Information Sheet for test on 03/24/15 at 7:30 am Patient notified to arrive 15 minutes early and that it is imperative to arrive on time for appointment to keep from having the test rescheduled.  If you need to cancel or reschedule your appointment, please call the office within 24 hours of your appointment. Failure to do so may result in a cancellation of your appointment, and a $50 no show fee. Patient verbalized understanding. Hubbard Robinson, RN

## 2015-03-24 ENCOUNTER — Ambulatory Visit (HOSPITAL_COMMUNITY): Payer: Medicare Other | Attending: Cardiology

## 2015-03-24 DIAGNOSIS — R5383 Other fatigue: Secondary | ICD-10-CM | POA: Insufficient documentation

## 2015-03-24 DIAGNOSIS — R0789 Other chest pain: Secondary | ICD-10-CM | POA: Diagnosis not present

## 2015-03-24 DIAGNOSIS — R9439 Abnormal result of other cardiovascular function study: Secondary | ICD-10-CM | POA: Diagnosis not present

## 2015-03-24 DIAGNOSIS — R0600 Dyspnea, unspecified: Secondary | ICD-10-CM | POA: Diagnosis not present

## 2015-03-24 LAB — MYOCARDIAL PERFUSION IMAGING
CHL CUP NUCLEAR SDS: 2
CHL CUP NUCLEAR SRS: 3
CHL CUP NUCLEAR SSS: 5
CSEPED: 9 min
CSEPPHR: 137 {beats}/min
Estimated workload: 10.1 METS
Exercise duration (sec): 0 s
LV dias vol: 122 mL
LVSYSVOL: 53 mL
MPHR: 149 {beats}/min
Percent HR: 91 %
RATE: 0.3
Rest HR: 56 {beats}/min
TID: 0.99

## 2015-03-24 MED ORDER — TECHNETIUM TC 99M SESTAMIBI GENERIC - CARDIOLITE
30.0000 | Freq: Once | INTRAVENOUS | Status: AC | PRN
  Administered 2015-03-24: 30 via INTRAVENOUS

## 2015-03-24 MED ORDER — TECHNETIUM TC 99M SESTAMIBI GENERIC - CARDIOLITE
10.6000 | Freq: Once | INTRAVENOUS | Status: AC | PRN
  Administered 2015-03-24: 11 via INTRAVENOUS

## 2015-03-26 ENCOUNTER — Encounter: Payer: Self-pay | Admitting: Cardiology

## 2015-03-26 ENCOUNTER — Ambulatory Visit (INDEPENDENT_AMBULATORY_CARE_PROVIDER_SITE_OTHER): Payer: Medicare Other | Admitting: Cardiology

## 2015-03-26 VITALS — BP 128/72 | HR 98 | Ht 71.0 in | Wt 194.1 lb

## 2015-03-26 DIAGNOSIS — R0600 Dyspnea, unspecified: Secondary | ICD-10-CM

## 2015-03-26 DIAGNOSIS — I208 Other forms of angina pectoris: Secondary | ICD-10-CM

## 2015-03-26 DIAGNOSIS — Z01812 Encounter for preprocedural laboratory examination: Secondary | ICD-10-CM | POA: Diagnosis not present

## 2015-03-26 DIAGNOSIS — I480 Paroxysmal atrial fibrillation: Secondary | ICD-10-CM | POA: Diagnosis not present

## 2015-03-26 MED ORDER — ASPIRIN EC 81 MG PO TBEC: 81.0000 mg | DELAYED_RELEASE_TABLET | Freq: Every day | ORAL | Status: DC

## 2015-03-26 NOTE — Patient Instructions (Addendum)
Medication Instructions:  Your physician has recommended you make the following change in your medication:  1- START taking Asprin 81 mg by mouth daily until after your procedure.   Labwork: Your physician recommends that you return for lab work on 04/04/2015 for BNP, BMET, CBC, and INR/PT  Testing/Procedures: A chest x-ray takes a picture of the organs and structures inside the chest, including the heart, lungs, and blood vessels. This test can show several things, including, whether the heart is enlarges; whether fluid is building up in the lungs; and whether pacemaker / defibrillator leads are still in place.  Your physician has requested that you have a cardiac catheterization. Cardiac catheterization is used to diagnose and/or treat various heart conditions. Doctors may recommend this procedure for a number of different reasons. The most common reason is to evaluate chest pain. Chest pain can be a symptom of coronary artery disease (CAD), and cardiac catheterization can show whether plaque is narrowing or blocking your heart's arteries. This procedure is also used to evaluate the valves, as well as measure the blood flow and oxygen levels in different parts of your heart. For further information please visit [email or web address]. Please follow instruction sheet, as given.  Follow-Up: Your physician recommends that you schedule a follow-up appointment as directed after your cardiac catheterization.   Any Other Special Instructions Will Be Listed Below (If Applicable).

## 2015-03-26 NOTE — Progress Notes (Signed)
[facility]. [address] [place], [place]  [address] Phone: [phone] Fax:  [phone]  Date:  03/26/2015   ID:  Connor Duffy, DOB [birth year], MRN [id number]  PCP:  PROVIDER NOT IN SYSTEM   History of Present Illness: Connor Duffy is a 74 y.o. male new onset dyspnea on exertion, fatigue, lightheadedness that was unusual for him during one of his extensive hiking trips. He had his wife go ahead as he continued at a slower pace. Denied any chest discomfort but he felt some shortness of breath and unsettling sensation that was unusual for him. He does not drink significant alcohol, no recent decongestants, no recent surgeries, fevers, chills, cough. No recent skin or hair changes. He was sent over for an exercise treadmill test by Dr. Baldemar Duffy.  Interestingly, as we were obtaining the original EKG, he demonstrated atrial fibrillation with rapid ventricular response, heart rate of approximate 115 beats per minute. This was a new diagnosis for him. He said he felt perhaps slightly lightheaded here in our office. While I was talking with him then auscultating him, he felt once again regular and sure enough, he was back in sinus rhythm. We then performed treadmill test which overall was low risk, 10 minutes, occasional PVCs/couplets but no ST segment changes indicative of ischemia. 12 METs of activity. His wife, Connor Duffy was present.   We discussed at length the implications of atrial fibrillation. CHADS-VAS is 1. Age only. Aspirin he is taking. We discussed the risks of stroke with atrial fibrillation. We discussed the possibilities of antiarrhythmic therapy and even touched on ablation therapy which I stated would be only if he were to remain highly symptomatic especially with antiarrhythmic medications. We decided to initiate low-dose diltiazem 120 mg.   08/27/13-told me about his amazing avenger hiking across [employer] 250 miles. 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 No strokelike symptoms. No chest pain.  03/14/15 - feeling atypical chest pain-mostly a back heaviness when walking occasionally, also increased dyspnea on exertion. His brother recently had myocardial infarction. He is concerned about the possibility of coronary artery disease. Dyspnea most often with elevation hikes. Feels 80% of prior stamina.    03/26/15-he comes back today to discuss his nuclear stress test. Despite overall low risk, he still feels as though something is wrong. His dyspnea is quite significant he states. Has decreased exercise tolerance is noteworthy.  Wt Readings from Last 3 Encounters:  03/26/15 194 lb 1.9 oz (88.052 kg)  03/24/15 192 lb (87.091 kg)  03/14/15 192 lb 12.8 oz (87.454 kg)     Past Medical History  Diagnosis Date  . Arthritis   . Cancer     PROSTATE  . Spinal stenosis   . Atrial fibrillation     Past Surgical History  Procedure Laterality Date  . Prostatectomy    . Penile prosthesis implant    . Knee arthroscopy  LEFT  . Total hip arthroplasty  07/08/2011    Procedure: TOTAL HIP ARTHROPLASTY ANTERIOR APPROACH;  Surgeon: Mcarthur Rossetti;  Location: [facility];  Service: Orthopedics;  Laterality: Right;  Right Total Hip Arthroplasty, Direct Anterior Approach   (c-arm)    Current Outpatient Prescriptions  Medication Sig Dispense Refill  . diltiazem (CARDIZEM LA) 120 MG 24 hr tablet Take 120 mg by mouth daily.     No current facility-administered medications for this visit.    Allergies:   No Known Allergies  Social History:  The patient  reports that he has never smoked. He does not have any smokeless tobacco history on file. He reports that he drinks alcohol. He reports that he does not use illicit drugs.   ROS:  Please see the history of present illness.   Denies any fevers, chills, orthopnea, PND, strokelike symptoms    PHYSICAL EXAM: VS:  BP 128/72 mmHg  Pulse 98  Ht 5\' 11"  (1.803 m)  Wt 194 lb 1.9 oz (88.052 kg)  BMI  27.09 kg/m2  SpO2 95% Well nourished, well developed, in no acute distress HEENT: normal Neck: no JVD Cardiac:  normal S1, S2; RRR; no murmur Lungs:  clear to auscultation bilaterally, no wheezing, rhonchi or rales Abd: soft, nontender, no hepatomegaly Ext: no edema Skin: warm and dry Neuro: no focal abnormalities noted  EKG:  Today: 03/14/15-sinus rhythm, borderline first-degree AV block, PVC noted personally viewed-NSR 68, PVC, IRBBB, NSSTW changes.     Echocardiogram 01/26/13 -Normal ejection fraction, mild left atrial enlargement.   NUC stress 03/24/15   Nuclear stress EF: 56%.   Blood pressure demonstrated a blunted response to exercise.   There was no ST segment deviation noted during stress.   This is a low risk study.   The left ventricular ejection fraction is normal (55-65%).  Low risk stress nuclear study with a small, moderate intensity, partially reversible anterior defect consistent with soft tissue attenuation and mild anterior ischemia; EF 56 with normal wall motion.  Overall reassuring. Lets continue to monitor his symptoms.  [facility].   ASSESSMENT AND PLAN:  1. Paroxysmal atrial fibrillation - currently doing very well. CHADS-VAS is 1. Age only. He is very active.Doing very well. No changes made. We discussed at length possibility of anticoagulation once he hits 77. 2. Dyspnea/chest pain - felt weight on back, more significant dyspnea than usual when walking, atypical chest pain. Stamina is fairly decreased. His brother recently had myocardial infarction.  his nuclear stress test overall was low risk, 9 minutes on treadmill however he comes to me today and states that he feels as though something is wrong. His dyspnea is quite severe he states. We will go ahead and proceed with diagnostic angiography. Risks and benefits explained including stroke, heart attack, death, bleeding. We will check a chest x-ray. Basic metabolic profile, CBC, BNP. If  cardiac catheterization is reassuring, we'll likely pursue echo. Consider pulmonary evaluation if normal as well. He does state that sometimes he will feel a subtle wheeze at the end of exercise.  3. Grieving-loss of their son. 4. PVC's - noted on ECG - diltiazem. 5. One year follow up  Signed, Candee Furbish, MD [facility]  03/26/2015 3:46 PM

## 2015-04-04 ENCOUNTER — Ambulatory Visit
Admission: RE | Admit: 2015-04-04 | Discharge: 2015-04-04 | Disposition: A | Discharge status: Home / Self Care | Payer: Medicare Other | Source: Ambulatory Visit | Attending: Cardiology | Admitting: Cardiology

## 2015-04-04 ENCOUNTER — Other Ambulatory Visit (INDEPENDENT_AMBULATORY_CARE_PROVIDER_SITE_OTHER): Payer: Medicare Other | Admitting: *Deleted

## 2015-04-04 DIAGNOSIS — I480 Paroxysmal atrial fibrillation: Secondary | ICD-10-CM | POA: Diagnosis not present

## 2015-04-04 DIAGNOSIS — R0600 Dyspnea, unspecified: Secondary | ICD-10-CM

## 2015-04-04 DIAGNOSIS — Z01812 Encounter for preprocedural laboratory examination: Secondary | ICD-10-CM

## 2015-04-04 DIAGNOSIS — I208 Other forms of angina pectoris: Secondary | ICD-10-CM

## 2015-04-04 DIAGNOSIS — R0602 Shortness of breath: Secondary | ICD-10-CM | POA: Diagnosis not present

## 2015-04-04 LAB — CBC
HEMATOCRIT: 44.6 % (ref 39.0–52.0)
HEMOGLOBIN: 15 g/dL (ref 13.0–17.0)
MCHC: 33.5 g/dL (ref 30.0–36.0)
MCV: 92.2 fl (ref 78.0–100.0)
PLATELETS: 258 10*3/uL (ref 150.0–400.0)
RBC: 4.83 Mil/uL (ref 4.22–5.81)
RDW: 13.8 % (ref 11.5–15.5)
WBC: 5.6 10*3/uL (ref 4.0–10.5)

## 2015-04-04 LAB — BASIC METABOLIC PANEL
BUN: 28 mg/dL — ABNORMAL HIGH (ref 6–23)
CHLORIDE: 106 meq/L (ref 96–112)
CO2: 30 meq/L (ref 19–32)
Calcium: 9.6 mg/dL (ref 8.4–10.5)
Creatinine, Ser: 1.22 mg/dL (ref 0.40–1.50)
GFR: 62.09 mL/min (ref >60.00)
GLUCOSE: 101 mg/dL — AB (ref 70–99)
POTASSIUM: 5.2 meq/L — AB (ref 3.5–5.1)
SODIUM: 142 meq/L (ref 135–145)

## 2015-04-04 LAB — BRAIN NATRIURETIC PEPTIDE: Pro B Natriuretic peptide (BNP): 53 pg/mL (ref 0.0–100.0)

## 2015-04-04 LAB — PROTIME-INR
INR: 1 ratio (ref 0.8–1.0)
PROTHROMBIN TIME: 10.7 s (ref 9.6–13.1)

## 2015-04-07 DIAGNOSIS — C61 Malignant neoplasm of prostate: Secondary | ICD-10-CM | POA: Diagnosis not present

## 2015-04-08 ENCOUNTER — Encounter (HOSPITAL_COMMUNITY)
Admission: RE | Disposition: A | Discharge status: Home / Self Care | Payer: Medicare Other | Source: Ambulatory Visit | Attending: Cardiology

## 2015-04-08 ENCOUNTER — Ambulatory Visit (HOSPITAL_COMMUNITY)
Admission: RE | Admit: 2015-04-08 | Discharge: 2015-04-08 | Disposition: A | Discharge status: Home / Self Care | Payer: Medicare Other | Source: Ambulatory Visit | Attending: Cardiology | Admitting: Cardiology

## 2015-04-08 DIAGNOSIS — I2089 Other forms of angina pectoris: Secondary | ICD-10-CM | POA: Insufficient documentation

## 2015-04-08 DIAGNOSIS — Z8249 Family history of ischemic heart disease and other diseases of the circulatory system: Secondary | ICD-10-CM | POA: Insufficient documentation

## 2015-04-08 DIAGNOSIS — M4800 Spinal stenosis, site unspecified: Secondary | ICD-10-CM | POA: Insufficient documentation

## 2015-04-08 DIAGNOSIS — R0600 Dyspnea, unspecified: Secondary | ICD-10-CM | POA: Diagnosis not present

## 2015-04-08 DIAGNOSIS — Z96641 Presence of right artificial hip joint: Secondary | ICD-10-CM | POA: Insufficient documentation

## 2015-04-08 DIAGNOSIS — R0789 Other chest pain: Secondary | ICD-10-CM | POA: Insufficient documentation

## 2015-04-08 DIAGNOSIS — I480 Paroxysmal atrial fibrillation: Secondary | ICD-10-CM | POA: Diagnosis not present

## 2015-04-08 DIAGNOSIS — M1990 Unspecified osteoarthritis, unspecified site: Secondary | ICD-10-CM | POA: Insufficient documentation

## 2015-04-08 DIAGNOSIS — Z8546 Personal history of malignant neoplasm of prostate: Secondary | ICD-10-CM | POA: Insufficient documentation

## 2015-04-08 DIAGNOSIS — I2510 Atherosclerotic heart disease of native coronary artery without angina pectoris: Secondary | ICD-10-CM | POA: Insufficient documentation

## 2015-04-08 DIAGNOSIS — I493 Ventricular premature depolarization: Secondary | ICD-10-CM | POA: Insufficient documentation

## 2015-04-08 DIAGNOSIS — I208 Other forms of angina pectoris: Secondary | ICD-10-CM | POA: Insufficient documentation

## 2015-04-08 HISTORY — PX: CARDIAC CATHETERIZATION: SHX172

## 2015-04-08 SURGERY — LEFT HEART CATH AND CORONARY ANGIOGRAPHY
Anesthesia: LOCAL

## 2015-04-08 MED ORDER — HEPARIN SODIUM (PORCINE) 1000 UNIT/ML IJ SOLN
INTRAMUSCULAR | Status: DC | PRN
  Administered 2015-04-08: 5000 [IU] via INTRAVENOUS

## 2015-04-08 MED ORDER — HEPARIN (PORCINE) IN NACL 2-0.9 UNIT/ML-% IJ SOLN
INTRAMUSCULAR | Status: AC
  Filled 2015-04-08: qty 1000

## 2015-04-08 MED ORDER — FENTANYL CITRATE (PF) 100 MCG/2ML IJ SOLN
INTRAMUSCULAR | Status: AC
  Filled 2015-04-08: qty 4

## 2015-04-08 MED ORDER — FENTANYL CITRATE (PF) 100 MCG/2ML IJ SOLN
INTRAMUSCULAR | Status: DC | PRN
  Administered 2015-04-08: 25 ug via INTRAVENOUS

## 2015-04-08 MED ORDER — MIDAZOLAM HCL 2 MG/2ML IJ SOLN
INTRAMUSCULAR | Status: DC | PRN
  Administered 2015-04-08: 2 mg via INTRAVENOUS

## 2015-04-08 MED ORDER — SODIUM CHLORIDE 0.9 % WEIGHT BASED INFUSION
3.0000 mL/kg/h | INTRAVENOUS | Status: DC
  Administered 2015-04-08: 3 mL/kg/h via INTRAVENOUS

## 2015-04-08 MED ORDER — SODIUM CHLORIDE 0.9 % IJ SOLN: 3.0000 mL | Freq: Two times a day (BID) | INTRAMUSCULAR | Status: DC

## 2015-04-08 MED ORDER — LIDOCAINE HCL (PF) 1 % IJ SOLN
INTRAMUSCULAR | Status: AC
  Filled 2015-04-08: qty 30

## 2015-04-08 MED ORDER — SODIUM CHLORIDE 0.9 % IJ SOLN: 3.0000 mL | INTRAMUSCULAR | Status: DC | PRN

## 2015-04-08 MED ORDER — SODIUM CHLORIDE 0.9 % IV SOLN: 250.0000 mL | INTRAVENOUS | Status: DC | PRN

## 2015-04-08 MED ORDER — ASPIRIN 81 MG PO CHEW: 81.0000 mg | CHEWABLE_TABLET | ORAL | Status: DC

## 2015-04-08 MED ORDER — SODIUM CHLORIDE 0.9 % WEIGHT BASED INFUSION: 3.0000 mL/kg/h | INTRAVENOUS | Status: DC

## 2015-04-08 MED ORDER — VERAPAMIL HCL 2.5 MG/ML IV SOLN
INTRAVENOUS | Status: AC
  Filled 2015-04-08: qty 2

## 2015-04-08 MED ORDER — SODIUM CHLORIDE 0.9 % WEIGHT BASED INFUSION: 1.0000 mL/kg/h | INTRAVENOUS | Status: DC

## 2015-04-08 MED ORDER — MIDAZOLAM HCL 2 MG/2ML IJ SOLN
INTRAMUSCULAR | Status: AC
  Filled 2015-04-08: qty 4

## 2015-04-08 MED ORDER — HEPARIN SODIUM (PORCINE) 1000 UNIT/ML IJ SOLN
INTRAMUSCULAR | Status: AC
  Filled 2015-04-08: qty 1

## 2015-04-08 SURGICAL SUPPLY — 11 items

## 2015-04-08 NOTE — H&P (View-Only) (Signed)
[facility]. [address], [address] [place], [place]  [address] Phone: [phone] Fax:  [phone]  Date:  03/26/2015   ID:  Connor Duffy, DOB 02-28-1941, MRN [id number]  PCP:  PROVIDER NOT IN SYSTEM   History of Present Illness: Connor Duffy is a 74 y.o. male new onset dyspnea on exertion, fatigue, lightheadedness that was unusual for him during one of his extensive hiking trips. He had his wife go ahead as he continued at a slower pace. Denied any chest discomfort but he felt some shortness of breath and unsettling sensation that was unusual for him. He does not drink significant alcohol, no recent decongestants, no recent surgeries, fevers, chills, cough. No recent skin or hair changes. He was sent over for an exercise treadmill test by Dr. Baldemar Lenis.  Interestingly, as we were obtaining the original EKG, he demonstrated atrial fibrillation with rapid ventricular response, heart rate of approximate 115 beats per minute. This was a new diagnosis for him. He said he felt perhaps slightly lightheaded here in our office. While I was talking with him then auscultating him, he felt once again regular and sure enough, he was back in sinus rhythm. We then performed treadmill test which overall was low risk, 10 minutes, occasional PVCs/couplets but no ST segment changes indicative of ischemia. 12 METs of activity. His wife, [name] was present.   We discussed at length the implications of atrial fibrillation. CHADS-VAS is 1. Age only. Aspirin he is taking. We discussed the risks of stroke with atrial fibrillation. We discussed the possibilities of antiarrhythmic therapy and even touched on ablation therapy which I stated would be only if he were to remain highly symptomatic especially with antiarrhythmic medications. We decided to initiate low-dose diltiazem 120 mg.   08/27/13-told me about his amazing avenger hiking [place] [employer] 250 miles. 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 No strokelike symptoms. No chest pain.  03/14/15 - feeling atypical chest pain-mostly a back heaviness when walking occasionally, also increased dyspnea on exertion. His brother recently had myocardial infarction. He is concerned about the possibility of coronary artery disease. Dyspnea most often with elevation hikes. Feels 80% of prior stamina.    03/26/15-he comes back today to discuss his nuclear stress test. Despite overall low risk, he still feels as though something is wrong. His dyspnea is quite significant he states. Has decreased exercise tolerance is noteworthy.  Wt Readings from Last 3 Encounters:  03/26/15 194 lb 1.9 oz (88.052 kg)  03/24/15 192 lb (87.091 kg)  03/14/15 192 lb 12.8 oz (87.454 kg)     Past Medical History  Diagnosis Date  . Arthritis   . Cancer     PROSTATE  . Spinal stenosis   . Atrial fibrillation     Past Surgical History  Procedure Laterality Date  . Prostatectomy    . Penile prosthesis implant    . Knee arthroscopy  LEFT  . Total hip arthroplasty  07/08/2011    Procedure: TOTAL HIP ARTHROPLASTY ANTERIOR APPROACH;  Surgeon: Mcarthur Rossetti;  Location: [facility];  Service: Orthopedics;  Laterality: Right;  Right Total Hip Arthroplasty, Direct Anterior Approach   (c-arm)    Current Outpatient Prescriptions  Medication Sig Dispense Refill  . diltiazem (CARDIZEM LA) 120 MG 24 hr tablet Take 120 mg by mouth daily.     No current facility-administered medications for this visit.    Allergies:   No Known Allergies  Social History:  The patient  reports that he has never smoked. He does not have any smokeless tobacco history on file. He reports that he drinks alcohol. He reports that he does not use illicit drugs.   ROS:  Please see the history of present illness.   Denies any fevers, chills, orthopnea, PND, strokelike symptoms    PHYSICAL EXAM: VS:  BP 128/72 mmHg  Pulse 98  Ht 5\' 11"  (1.803 m)  Wt 194 lb 1.9 oz (88.052 kg)  BMI  27.09 kg/m2  SpO2 95% Well nourished, well developed, in no acute distress HEENT: normal Neck: no JVD Cardiac:  normal S1, S2; RRR; no murmur Lungs:  clear to auscultation bilaterally, no wheezing, rhonchi or rales Abd: soft, nontender, no hepatomegaly Ext: no edema Skin: warm and dry Neuro: no focal abnormalities noted  EKG:  Today: 03/14/15-sinus rhythm, borderline first-degree AV block, PVC noted personally viewed-NSR 68, PVC, IRBBB, NSSTW changes.     Echocardiogram 01/26/13 -Normal ejection fraction, mild left atrial enlargement.   NUC stress 03/24/15   Nuclear stress EF: 56%.   Blood pressure demonstrated a blunted response to exercise.   There was no ST segment deviation noted during stress.   This is a low risk study.   The left ventricular ejection fraction is normal (55-65%).  Low risk stress nuclear study with a small, moderate intensity, partially reversible anterior defect consistent with soft tissue attenuation and mild anterior ischemia; EF 56 with normal wall motion.  Overall reassuring. Lets continue to monitor his symptoms.  [facility] is [facility].   ASSESSMENT AND PLAN:  1. Paroxysmal atrial fibrillation - currently doing very well. CHADS-VAS is 1. Age only. He is very active.Doing very well. No changes made. We discussed at length possibility of anticoagulation once he hits 77. 2. Dyspnea/chest pain - felt weight on back, more significant dyspnea than usual when walking, atypical chest pain. Stamina is fairly decreased. His brother recently had myocardial infarction.  his nuclear stress test overall was low risk, 9 minutes on treadmill however he comes to me today and states that he feels as though something is wrong. His dyspnea is quite severe he states. We will go ahead and proceed with diagnostic angiography. Risks and benefits explained including stroke, heart attack, death, bleeding. We will check a chest x-ray. Basic metabolic profile, CBC, BNP. If  cardiac catheterization is reassuring, we'll likely pursue echo. Consider pulmonary evaluation if normal as well. He does state that sometimes he will feel a subtle wheeze at the end of exercise.  3. Grieving-loss of their son. 4. PVC's - noted on ECG - diltiazem. 5. One year follow up  Signed, Candee Furbish, MD [facility]  03/26/2015 3:46 PM

## 2015-04-08 NOTE — Discharge Instructions (Signed)
Radial Site Care °Refer to this sheet in the next few weeks. These instructions provide you with information on caring for yourself after your procedure. Your caregiver may also give you more specific instructions. Your treatment has been planned according to current medical practices, but problems sometimes occur. Call your caregiver if you have any problems or questions after your procedure. °HOME CARE INSTRUCTIONS °· You may shower the day after the procedure. Remove the bandage (dressing) and gently wash the site with plain soap and water. Gently pat the site dry. °· Do not apply powder or lotion to the site. °· Do not submerge the affected site in water for 3 to 5 days. °· Inspect the site at least twice daily. °· Do not flex or bend the affected arm for 24 hours. °· No lifting over 5 pounds (2.3 kg) for 5 days after your procedure. °· Do not drive home if you are discharged the same day of the procedure. Have someone else drive you. °· You may drive 24 hours after the procedure unless otherwise instructed by your caregiver. °· Do not operate machinery or power tools for 24 hours. °· A responsible adult should be with you for the first 24 hours after you arrive home. °What to expect: °· Any bruising will usually fade within 1 to 2 weeks. °· Blood that collects in the tissue (hematoma) may be painful to the touch. It should usually decrease in size and tenderness within 1 to 2 weeks. °SEEK IMMEDIATE MEDICAL CARE IF: °· You have unusual pain at the radial site. °· You have redness, warmth, swelling, or pain at the radial site. °· You have drainage (other than a small amount of blood on the dressing). °· You have chills. °· You have a fever or persistent symptoms for more than 72 hours. °· You have a fever and your symptoms suddenly get worse. °· Your arm becomes pale, cool, tingly, or numb. °· You have heavy bleeding from the site. Hold pressure on the site. °Document Released: 07/24/2010 Document Revised:  09/13/2011 Document Reviewed: 07/24/2010 °ExitCare® Patient Information ©2015 [employer]. This information is not intended to replace advice given to you by your health care provider. Make sure you discuss any questions you have with your health care provider. ° °

## 2015-04-08 NOTE — Interval H&P Note (Signed)
History and Physical Interval Note:  04/08/2015 9:59 AM  Connor Duffy  has presented today for surgery, with the diagnosis of Chest Pain  The various methods of treatment have been discussed with the patient and family. After consideration of risks, benefits and other options for treatment, the patient has consented to  Procedure(s): Left Heart Cath and Coronary Angiography (N/A) as a surgical intervention .  The patient's history has been reviewed, patient examined, no change in status, stable for surgery.  I have reviewed the patient's chart and labs.  Questions were answered to the patient's satisfaction.     Gregery Walberg

## 2015-04-09 ENCOUNTER — Encounter (HOSPITAL_COMMUNITY): Payer: Self-pay | Admitting: Cardiology

## 2015-04-09 MED FILL — Lidocaine HCl Local Preservative Free (PF) Inj 1%: INTRAMUSCULAR | Qty: 30 | Status: AC

## 2015-04-09 MED FILL — Heparin Sodium (Porcine) 2 Unit/ML in Sodium Chloride 0.9%: INTRAMUSCULAR | Qty: 1000 | Status: AC

## 2015-04-09 MED FILL — Verapamil HCl IV Soln 2.5 MG/ML: INTRAVENOUS | Qty: 2 | Status: AC

## 2015-05-28 DIAGNOSIS — D485 Neoplasm of uncertain behavior of skin: Secondary | ICD-10-CM | POA: Diagnosis not present

## 2015-05-28 DIAGNOSIS — Z86018 Personal history of other benign neoplasm: Secondary | ICD-10-CM | POA: Diagnosis not present

## 2015-05-28 DIAGNOSIS — C44511 Basal cell carcinoma of skin of breast: Secondary | ICD-10-CM | POA: Diagnosis not present

## 2015-05-28 DIAGNOSIS — L820 Inflamed seborrheic keratosis: Secondary | ICD-10-CM | POA: Diagnosis not present

## 2015-05-28 DIAGNOSIS — L821 Other seborrheic keratosis: Secondary | ICD-10-CM | POA: Diagnosis not present

## 2015-05-28 DIAGNOSIS — Z85828 Personal history of other malignant neoplasm of skin: Secondary | ICD-10-CM | POA: Diagnosis not present

## 2015-05-28 DIAGNOSIS — L570 Actinic keratosis: Secondary | ICD-10-CM | POA: Diagnosis not present

## 2015-05-28 DIAGNOSIS — D225 Melanocytic nevi of trunk: Secondary | ICD-10-CM | POA: Diagnosis not present

## 2015-06-03 DIAGNOSIS — C61 Malignant neoplasm of prostate: Secondary | ICD-10-CM | POA: Diagnosis not present

## 2015-06-19 ENCOUNTER — Telehealth: Payer: Self-pay | Admitting: Cardiology

## 2015-06-19 NOTE — Telephone Encounter (Signed)
New Message  Pt calling to speak w/ RN- pt stated that since he is taking eliquis, can he stop taking aspirin 81 mg. Please call back and discuss.

## 2015-06-19 NOTE — Telephone Encounter (Signed)
Per pt call - was seen at the [facility] and started on Eliquis 5 mg 2 tablets twice a day for 2 weeks then decrease to 1 tablet twice a day for a blood clot.  He states he was told to call here to ask if he should continue ASA.  Advised pt to hold ASA

## 2015-07-28 DIAGNOSIS — C44519 Basal cell carcinoma of skin of other part of trunk: Secondary | ICD-10-CM | POA: Diagnosis not present

## 2015-07-28 DIAGNOSIS — Z23 Encounter for immunization: Secondary | ICD-10-CM | POA: Diagnosis not present

## 2015-09-24 DIAGNOSIS — N390 Urinary tract infection, site not specified: Secondary | ICD-10-CM | POA: Diagnosis not present

## 2015-09-24 DIAGNOSIS — C61 Malignant neoplasm of prostate: Secondary | ICD-10-CM | POA: Diagnosis not present

## 2015-09-24 DIAGNOSIS — M4807 Spinal stenosis, lumbosacral region: Secondary | ICD-10-CM | POA: Diagnosis not present

## 2015-10-24 DIAGNOSIS — R232 Flushing: Secondary | ICD-10-CM | POA: Diagnosis not present

## 2015-10-24 DIAGNOSIS — N390 Urinary tract infection, site not specified: Secondary | ICD-10-CM | POA: Diagnosis not present

## 2015-10-24 DIAGNOSIS — C61 Malignant neoplasm of prostate: Secondary | ICD-10-CM | POA: Diagnosis not present

## 2015-10-24 DIAGNOSIS — C7951 Secondary malignant neoplasm of bone: Secondary | ICD-10-CM | POA: Diagnosis not present

## 2015-11-03 HISTORY — PX: SPINAL FUSION: SHX223

## 2015-11-24 DIAGNOSIS — C61 Malignant neoplasm of prostate: Secondary | ICD-10-CM | POA: Diagnosis not present

## 2015-11-24 DIAGNOSIS — N390 Urinary tract infection, site not specified: Secondary | ICD-10-CM | POA: Diagnosis not present

## 2016-03-22 IMAGING — CR DG CHEST 2V
2 series · 2 of 2 positions shown · non-contrast
Comparison: 01/16/2011

CLINICAL DATA: Pre procedure exam. For heart catheterization on
04/08/2015. Shortness of breath on exertion for 4 months. Atrial
fibrillation.

EXAM:
CHEST  2 VIEW

[w chest pa]
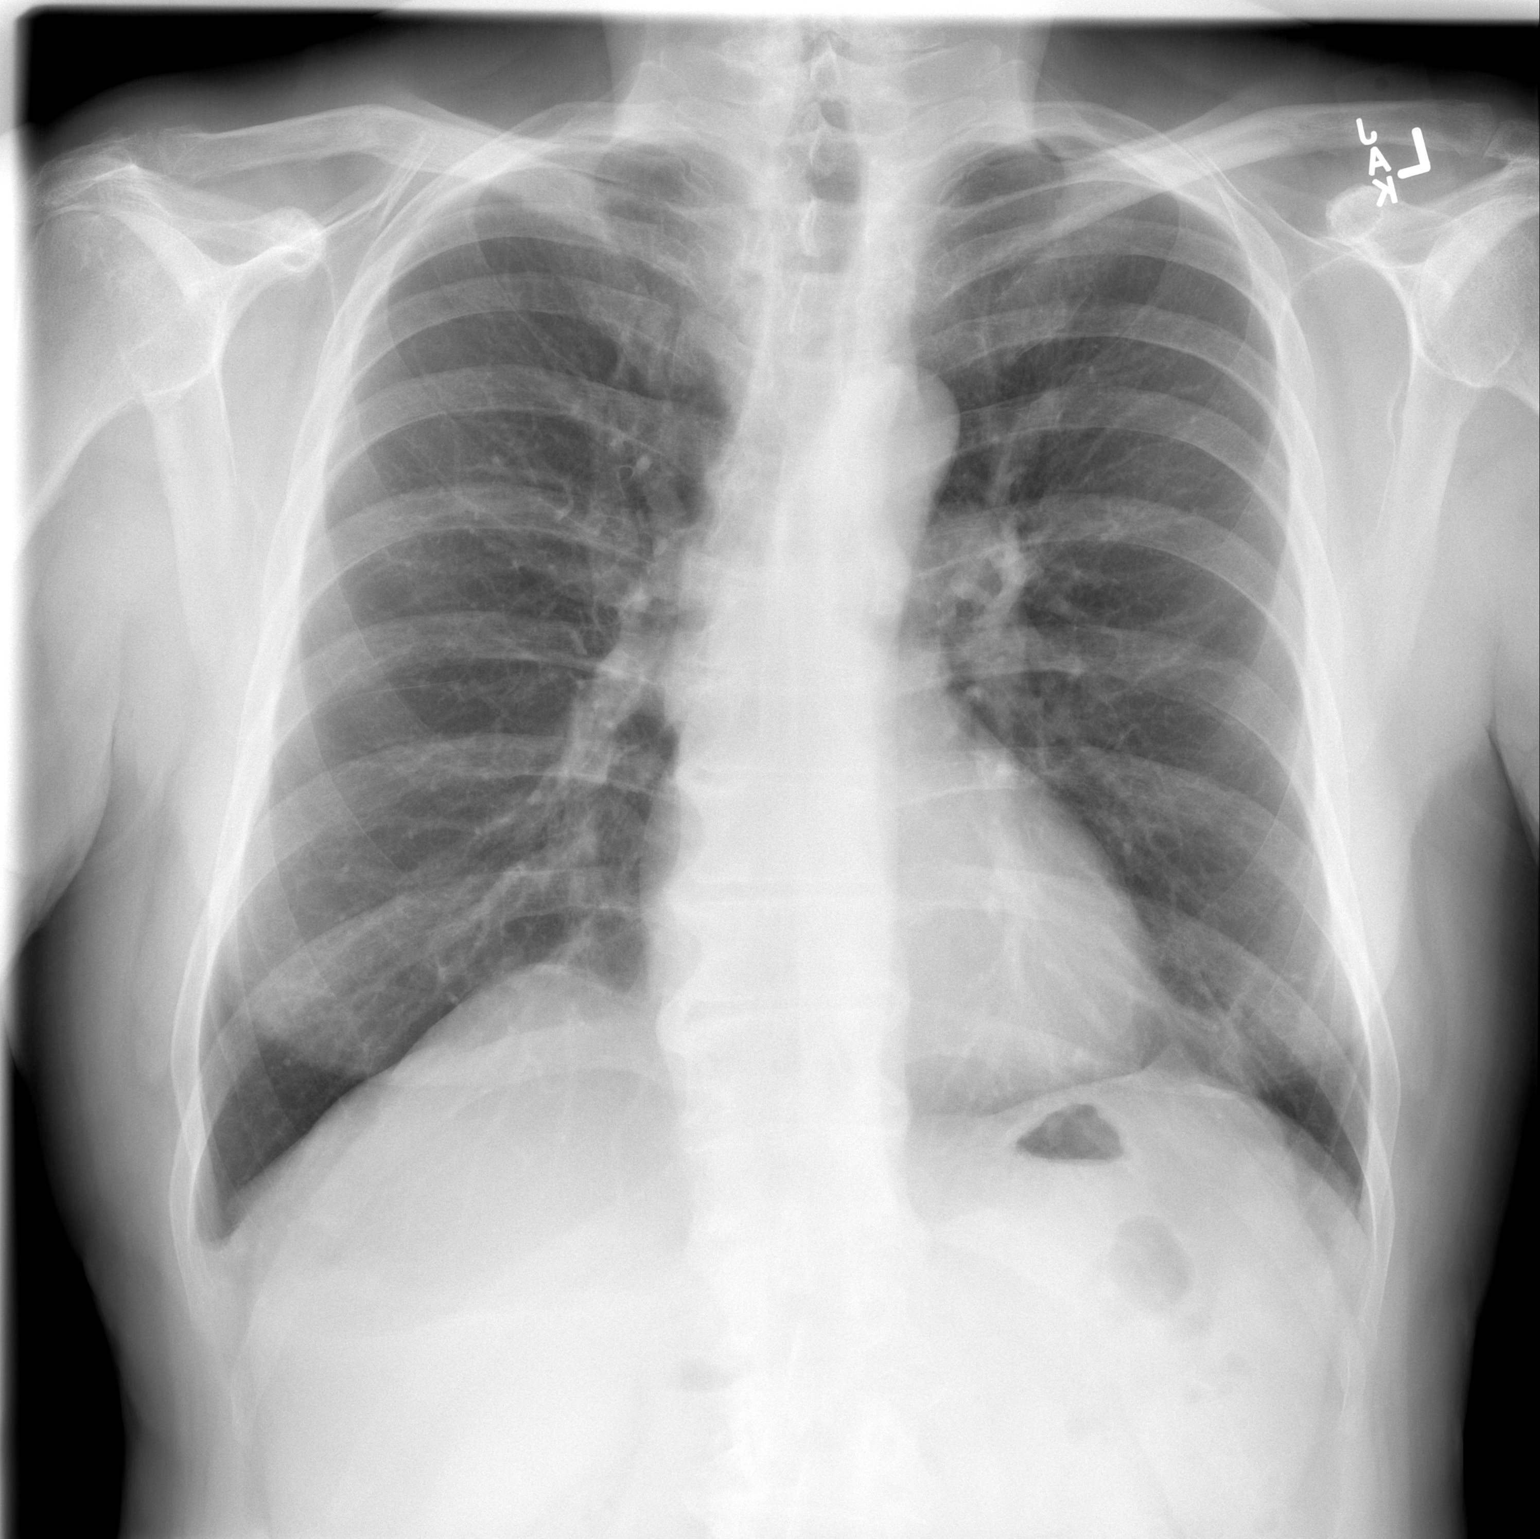

[w chest lat]
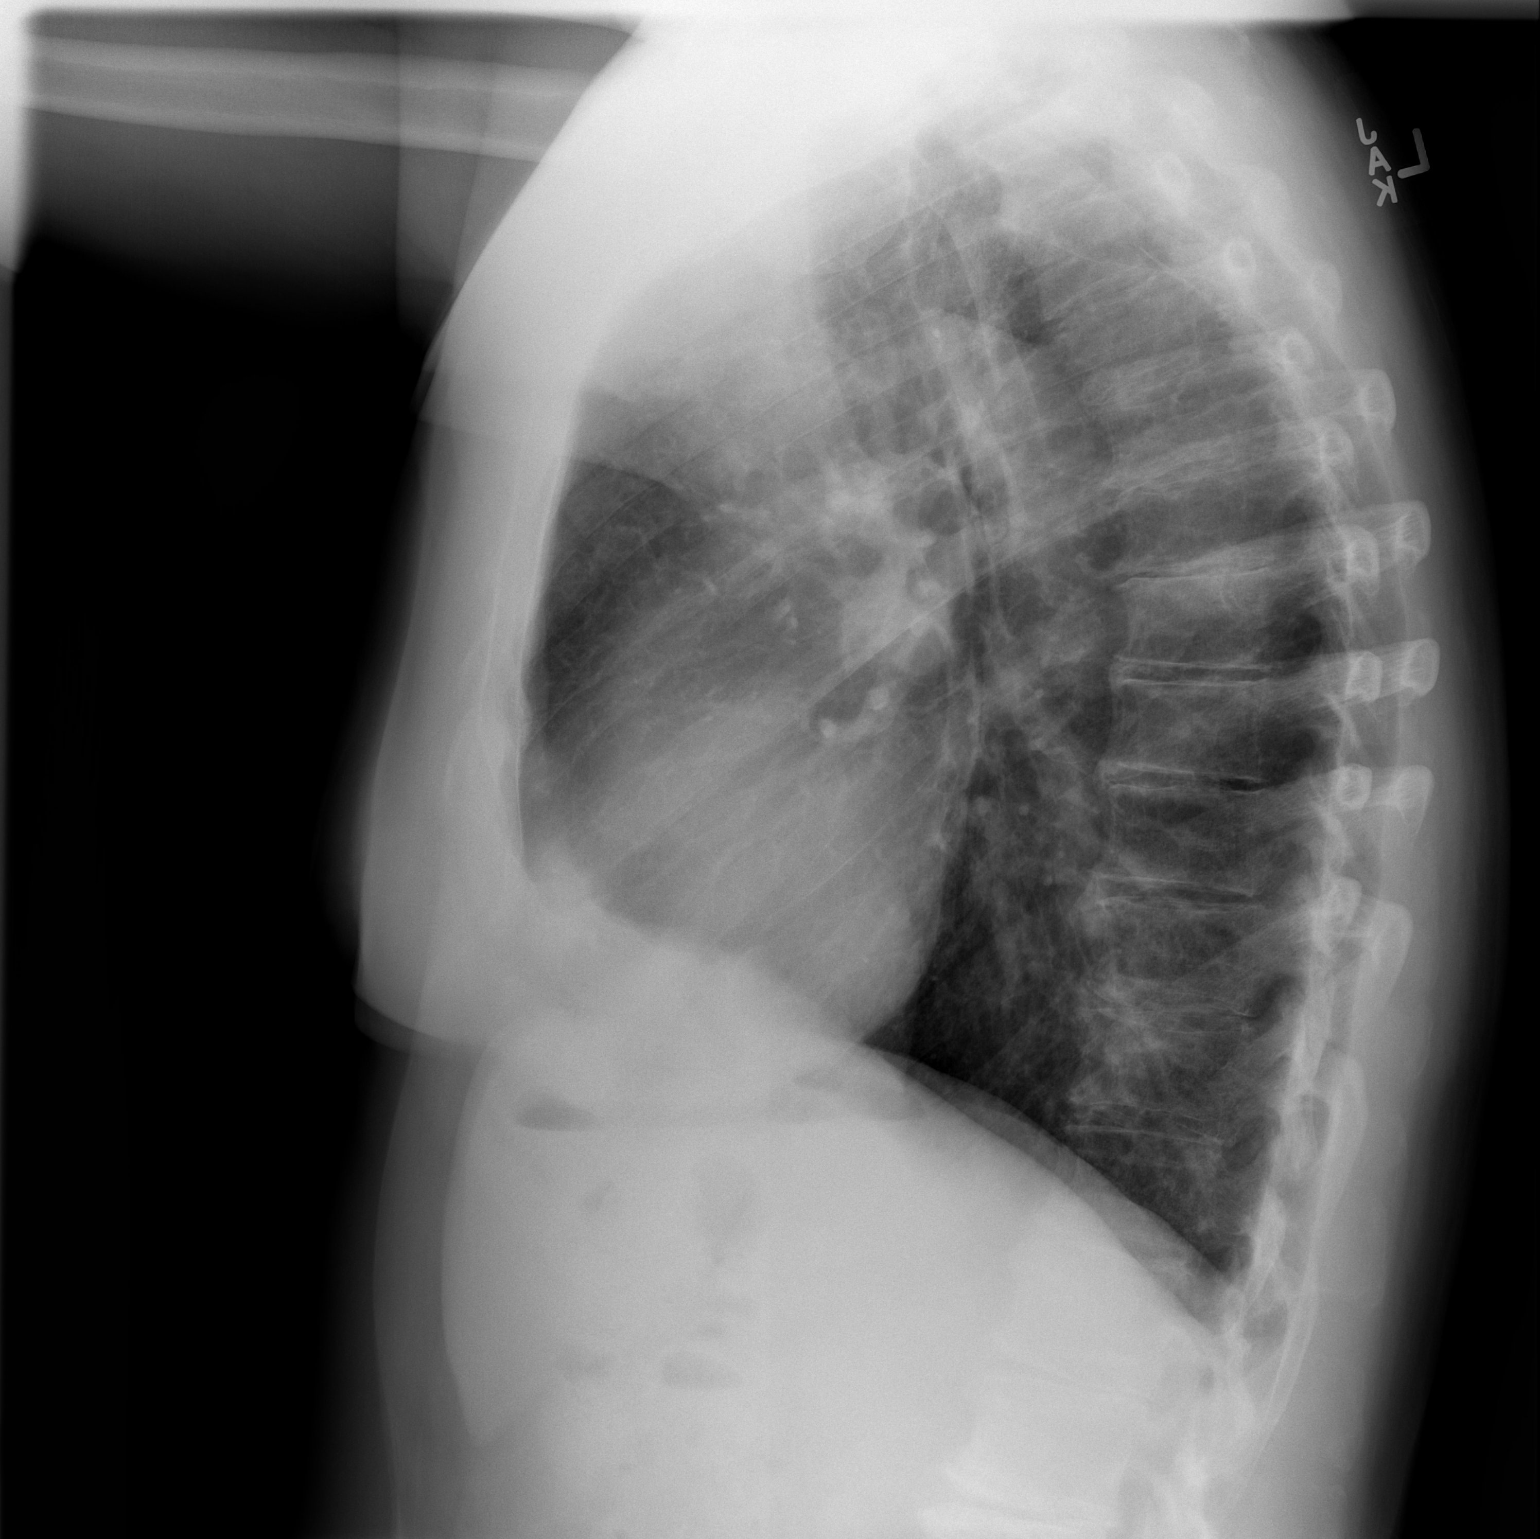

[2 of 2 positions shown; findings below may reference images not displayed]

FINDINGS: Heart size and pulmonary vascularity are normal and the lungs are
clear. Slight thoracic scoliosis. Right AC joint arthropathy.
IMPRESSION: No acute abnormalities.

## 2016-06-03 DIAGNOSIS — D2272 Melanocytic nevi of left lower limb, including hip: Secondary | ICD-10-CM | POA: Diagnosis not present

## 2016-06-03 DIAGNOSIS — D225 Melanocytic nevi of trunk: Secondary | ICD-10-CM | POA: Diagnosis not present

## 2016-06-03 DIAGNOSIS — Z23 Encounter for immunization: Secondary | ICD-10-CM | POA: Diagnosis not present

## 2016-06-03 DIAGNOSIS — L821 Other seborrheic keratosis: Secondary | ICD-10-CM | POA: Diagnosis not present

## 2016-06-03 DIAGNOSIS — Z86018 Personal history of other benign neoplasm: Secondary | ICD-10-CM | POA: Diagnosis not present

## 2016-06-03 DIAGNOSIS — Z85828 Personal history of other malignant neoplasm of skin: Secondary | ICD-10-CM | POA: Diagnosis not present

## 2016-06-03 DIAGNOSIS — L570 Actinic keratosis: Secondary | ICD-10-CM | POA: Diagnosis not present

## 2016-11-10 DIAGNOSIS — R351 Nocturia: Secondary | ICD-10-CM | POA: Diagnosis not present

## 2016-11-10 DIAGNOSIS — R8290 Unspecified abnormal findings in urine: Secondary | ICD-10-CM | POA: Diagnosis not present

## 2016-11-10 DIAGNOSIS — C61 Malignant neoplasm of prostate: Secondary | ICD-10-CM | POA: Diagnosis not present

## 2017-05-09 ENCOUNTER — Encounter (HOSPITAL_COMMUNITY): Payer: Self-pay | Admitting: *Deleted

## 2017-05-11 NOTE — H&P (Signed)
Connor Duffy, 76 y.o. Duffy, Connor Duffy, Connor Duffy and activity modification.  Onset of symptoms was gradual, starting ~3 years ago with gradually worsening course since that time. The patient noted no past surgery on the left knee(s).  Patient currently rates pain in the left knee(s) at 6 out of 10 with activity. Patient Connor worsening of pain with activity and weight bearing, pain that interferes with activities of daily living, pain with passive range of motion, crepitus and joint swelling.  Patient Connor evidence of periarticular osteophytes and joint space narrowing by imaging studies.  There is no active infection.   Risks, benefits and expectations were discussed with the patient.  Risks including but not limited to the risk of anesthesia, blood clots, nerve damage, blood vessel damage, failure of the prosthesis, infection and up to and including death.  Patient understand the risks, benefits and expectations and wishes to proceed with surgery.   PCP: System, Provider Not In  D/C Plans:       Home  Post-op Meds:       No Rx given  Tranexamic Acid:      To be given - topical (previous DVT)  Decadron:      Is to be given  FYI:     Pradaxa  Norco  DME:    Rx given for - RW   PT:    OPPT  Rx given    Patient Active Problem List   Diagnosis Date Noted  . 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 Dyspnea 03/14/2015  . Atypical chest pain 03/14/2015  . PAF (paroxysmal atrial fibrillation) ([place]) 03/14/2015  . Arthritis   . Cancer ([place])   . Spinal stenosis   . Atrial fibrillation ([place])   . Degenerative arthritis of hip 07/08/2011   Past Medical  History:  Diagnosis Date  . Arthritis   . Atrial fibrillation ([place])   . Cancer ([place])    PROSTATE  . Spinal stenosis     Past Surgical History:  Procedure Laterality Date  . KNEE ARTHROSCOPY  LEFT  . PENILE PROSTHESIS IMPLANT    . PROSTATECTOMY      No current facility-administered medications for this encounter.    Current Outpatient Medications  Medication Sig Dispense Refill Last Dose  . apixaban (ELIQUIS) 5 MG TABS tablet Take 5 mg by mouth 2 (two) times daily. Per pt started by [facility]   Taking  . diltiazem (CARDIZEM LA) 120 MG 24 hr tablet Take 120 mg by mouth daily.   Taking   No Known Allergies   Social History   Tobacco Use  . Smoking status: Never Smoker  Substance Use Topics  . Alcohol use: Yes    Comment: RARE    Family History  Problem Relation Age of Onset  . Cancer - Colon Father      Review of Systems  Constitutional: Negative.   HENT: Negative.   Eyes: Negative.   Respiratory: Negative.   Cardiovascular: Negative.   Gastrointestinal: Negative.   Genitourinary: Negative.   Musculoskeletal: Positive for joint pain.  Skin: Negative.   Neurological: Negative.   Endo/Heme/Allergies: Negative.   Psychiatric/Behavioral: Negative.  Objective:  Physical Exam  Constitutional: He is oriented to person, place, and time. He appears well-developed.  HENT:  Head: Normocephalic.  Eyes: Pupils are equal, round, and reactive to light.  Neck: Neck supple. No JVD present. No tracheal deviation present. No thyromegaly present.  Cardiovascular: Normal rate, regular rhythm and intact distal pulses.  Respiratory: Effort normal and breath sounds normal. No respiratory distress. He Connor no wheezes.  GI: Soft. There is no tenderness. There is no guarding.  Musculoskeletal:       Left knee: He exhibits decreased range of motion, swelling and bony tenderness. He exhibits no ecchymosis, no deformity, no laceration and no erythema. Tenderness found.  Lymphadenopathy:     He Connor no cervical adenopathy.  Neurological: He is alert and oriented to person, place, and time.  Skin: Skin is warm and dry.  Psychiatric: He Connor a normal mood and affect.      Labs:  Estimated body mass index is 26.78 kg/m as calculated from the following:   Height as of 04/08/15: 5\' 11"  (1.803 m).   Weight as of 04/08/15: 87.1 kg (192 lb).   Imaging Review Plain radiographs demonstrate severe degenerative joint disease of the left knee(s).  The bone quality appears to be good for age and reported activity level.  Assessment/Plan:  End stage arthritis, left knee   The patient history, physical examination, clinical judgment of the provider and imaging studies are consistent with end stage degenerative joint disease of the left knee(s) and Connor knee arthroplasty is deemed medically necessary. The treatment options including medical management, injection therapy arthroscopy and arthroplasty were discussed at length. The risks and benefits of Connor knee arthroplasty were presented and reviewed. The risks due to aseptic loosening, infection, stiffness, patella tracking problems, thromboembolic complications and other imponderables were discussed. The patient acknowledged the explanation, agreed to proceed with the plan and consent was signed. Patient is being admitted for inpatient treatment for surgery, pain control, PT, OT, prophylactic antibiotics, VTE prophylaxis, progressive ambulation and ADL's and discharge planning. The patient is planning to be discharged home.    West Pugh Tereza Gilham   PA-C  05/11/2017, 1:Connor PM

## 2017-05-17 NOTE — Patient Instructions (Signed)
Connor Duffy  05/17/2017   Your procedure is scheduled on: 05/24/17  Report to [facility] Main  Entrance   Follow signs to Short Stay on first floor at    0515 AM  Call this number if you have problems the morning of surgery  [phone]   Remember: ONLY 1 PERSON MAY GO WITH YOU TO SHORT STAY TO GET  READY MORNING OF [place].  Do not eat food or drink liquids :After Midnight.     Take these medicines the morning of surgery with A SIP OF WATER: Diltiazem, abiratorone acetate(zytiga)                               You may not have any metal on your body including hair pins and              piercings  Do not wear jewelry,  lotions, powders or perfumes, deodorant                  Men may shave face and neck.   Do not bring valuables to the hospital. [place].  Contacts, dentures or bridgework may not be worn into surgery.  Leave suitcase in the car. After surgery it may be brought to your room.                  Please read over the following fact sheets you were given: _____________________________________________________________________         [facility] - Preparing for Surgery Before surgery, you can play an important role.  Because skin is not sterile, your skin needs to be as free of germs as possible.  You can reduce the number of germs on your skin by washing with CHG (chlorahexidine gluconate) soap before surgery.  CHG is an antiseptic cleaner which kills germs and bonds with the skin to continue killing germs even after washing. Please DO NOT use if you have an allergy to CHG or antibacterial soaps.  If your skin becomes reddened/irritated stop using the CHG and inform your nurse when you arrive at Short Stay. Do not shave (including legs and underarms) for at least 48 hours prior to the first CHG shower.  You may shave your face/neck. Please follow these instructions carefully:  1.  Shower with  CHG Soap the night before surgery and the  morning of Surgery.  2.  If you choose to wash your hair, wash your hair first as usual with your  normal  shampoo.  3.  After you shampoo, rinse your hair and body thoroughly to remove the  shampoo.                           4.  Use CHG as you would any other liquid soap.  You can apply chg directly  to the skin and wash                       Gently with a scrungie or clean washcloth.  5.  Apply the CHG Soap to your body ONLY FROM THE NECK DOWN.   Do not use on face/ open  Wound or open sores. Avoid contact with eyes, ears mouth and genitals (private parts).                       Wash face,  Genitals (private parts) with your normal soap.             6.  Wash thoroughly, paying special attention to the area where your surgery  will be performed.  7.  Thoroughly rinse your body with warm water from the neck down.  8.  DO NOT shower/wash with your normal soap after using and rinsing off  the CHG Soap.                9.  Pat yourself dry with a clean towel.            10.  Wear clean pajamas.            11.  Place clean sheets on your bed the night of your first shower and do not  sleep with pets. Day of Surgery : Do not apply any lotions/deodorants the morning of surgery.  Please wear clean clothes to the hospital/surgery center.  FAILURE TO FOLLOW THESE INSTRUCTIONS MAY RESULT IN THE CANCELLATION OF YOUR SURGERY PATIENT SIGNATURE_________________________________  NURSE SIGNATURE__________________________________  ________________________________________________________________________  WHAT IS A BLOOD TRANSFUSION? Blood Transfusion Information  A transfusion is the replacement of blood or some of its parts. Blood is made up of multiple cells which provide different functions.  Red blood cells carry oxygen and are used for blood loss replacement.  Oleary blood cells fight against infection.  Platelets control  bleeding.  Plasma helps clot blood.  Other blood products are available for specialized needs, such as hemophilia or other clotting disorders. BEFORE THE TRANSFUSION  Who gives blood for transfusions?   Healthy volunteers who are fully evaluated to make sure their blood is safe. This is blood bank blood. Transfusion therapy is the safest it has ever been in the practice of medicine. Before blood is taken from a donor, a complete history is taken to make sure that person has no history of diseases nor engages in risky social behavior (examples are intravenous drug use or sexual activity with multiple partners). The donor's travel history is screened to minimize risk of transmitting infections, such as malaria. The donated blood is tested for signs of infectious diseases, such as HIV and hepatitis. The blood is then tested to be sure it is compatible with you in order to minimize the chance of a transfusion reaction. If you or a relative donates blood, this is often done in anticipation of surgery and is not appropriate for emergency situations. It takes many days to process the donated blood. RISKS AND COMPLICATIONS Although transfusion therapy is very safe and saves many lives, the main dangers of transfusion include:   Getting an infectious disease.  Developing a transfusion reaction. This is an allergic reaction to something in the blood you were given. Every precaution is taken to prevent this. The decision to have a blood transfusion has been considered carefully by your caregiver before blood is given. Blood is not given unless the benefits outweigh the risks. AFTER THE TRANSFUSION  Right after receiving a blood transfusion, you will usually feel much better and more energetic. This is especially true if your red blood cells have gotten low (anemic). The transfusion raises the level of the red blood cells which carry oxygen, and this usually causes an energy increase.  The  nurse  administering the transfusion will monitor you carefully for complications. HOME CARE INSTRUCTIONS  No special instructions are needed after a transfusion. You may find your energy is better. Speak with your caregiver about any limitations on activity for underlying diseases you may have. SEEK MEDICAL CARE IF:   Your condition is not improving after your transfusion.  You develop redness or irritation at the intravenous (IV) site. SEEK IMMEDIATE MEDICAL CARE IF:  Any of the following symptoms occur over the next 12 hours:  Shaking chills.  You have a temperature by mouth above 102 F (38.9 C), not controlled by medicine.  Chest, back, or muscle pain.  People around you feel you are not acting correctly or are confused.  Shortness of breath or difficulty breathing.  Dizziness and fainting.  You get a rash or develop hives.  You have a decrease in urine output.  Your urine turns a dark color or changes to pink, red, or brown. Any of the following symptoms occur over the next 10 days:  You have a temperature by mouth above 102 F (38.9 C), not controlled by medicine.  Shortness of breath.  Weakness after normal activity.  The Bernabei part of the eye turns yellow (jaundice).  You have a decrease in the amount of urine or are urinating less often.  Your urine turns a dark color or changes to pink, red, or brown. Document Released: 06/18/2000 Document Revised: 09/13/2011 Document Reviewed: 02/05/2008 ExitCare Patient Information 2014 [place].  _______________________________________________________________________  Incentive Spirometer  An incentive spirometer is a tool that can help keep your lungs clear and active. This tool measures how well you are filling your lungs with each breath. Taking long deep breaths may help reverse or decrease the chance of developing breathing (pulmonary) problems (especially infection) following:  A long period of time when you are  unable to move or be active. BEFORE THE PROCEDURE   If the spirometer includes an indicator to show your best effort, your nurse or respiratory therapist will set it to a desired goal.  If possible, sit up straight or lean slightly forward. Try not to slouch.  Hold the incentive spirometer in an upright position. INSTRUCTIONS FOR USE  1. Sit on the edge of your bed if possible, or sit up as far as you can in bed or on a chair. 2. Hold the incentive spirometer in an upright position. 3. Breathe out normally. 4. Place the mouthpiece in your mouth and seal your lips tightly around it. 5. Breathe in slowly and as deeply as possible, raising the piston or the ball toward the top of the column. 6. Hold your breath for 3-5 seconds or for as long as possible. Allow the piston or ball to fall to the bottom of the column. 7. Remove the mouthpiece from your mouth and breathe out normally. 8. Rest for a few seconds and repeat Steps 1 through 7 at least 10 times every 1-2 hours when you are awake. Take your time and take a few normal breaths between deep breaths. 9. The spirometer may include an indicator to show your best effort. Use the indicator as a goal to work toward during each repetition. 10. After each set of 10 deep breaths, practice coughing to be sure your lungs are clear. If you have an incision (the cut made at the time of surgery), support your incision when coughing by placing a pillow or rolled up towels firmly against it. Once you are able to get  out of bed, walk around indoors and cough well. You may stop using the incentive spirometer when instructed by your caregiver.  RISKS AND COMPLICATIONS  Take your time so you do not get dizzy or light-headed.  If you are in pain, you may need to take or ask for pain medication before doing incentive spirometry. It is harder to take a deep breath if you are having pain. AFTER USE  Rest and breathe slowly and easily.  It can be helpful to  keep track of a log of your progress. Your caregiver can provide you with a simple table to help with this. If you are using the spirometer at home, follow these instructions: [place] IF:   You are having difficultly using the spirometer.  You have trouble using the spirometer as often as instructed.  Your pain medication is not giving enough relief while using the spirometer.  You develop fever of 100.5 F (38.1 C) or higher. SEEK IMMEDIATE MEDICAL CARE IF:   You cough up bloody sputum that had not been present before.  You develop fever of 102 F (38.9 C) or greater.  You develop worsening pain at or near the incision site. MAKE SURE YOU:   Understand these instructions.  Will watch your condition.  Will get help right away if you are not doing well or get worse. Document Released: 11/01/2006 Document Revised: 09/13/2011 Document Reviewed: 01/02/2007 [facility] Patient Information 2014 [place], [place].   ________________________________________________________________________

## 2017-05-17 NOTE — Progress Notes (Signed)
EKG 12/29/16 on chart

## 2017-05-18 ENCOUNTER — Encounter (HOSPITAL_COMMUNITY): Payer: Self-pay

## 2017-05-18 ENCOUNTER — Other Ambulatory Visit: Payer: Self-pay

## 2017-05-18 ENCOUNTER — Encounter (HOSPITAL_COMMUNITY)
Admission: RE | Admit: 2017-05-18 | Discharge: 2017-05-18 | Disposition: A | Discharge status: Home / Self Care | Payer: Non-veteran care | Source: Ambulatory Visit | Attending: Orthopedic Surgery | Admitting: Orthopedic Surgery

## 2017-05-18 DIAGNOSIS — M1712 Unilateral primary osteoarthritis, left knee: Secondary | ICD-10-CM | POA: Diagnosis not present

## 2017-05-18 DIAGNOSIS — Z01812 Encounter for preprocedural laboratory examination: Secondary | ICD-10-CM | POA: Insufficient documentation

## 2017-05-18 DIAGNOSIS — I4891 Unspecified atrial fibrillation: Secondary | ICD-10-CM | POA: Insufficient documentation

## 2017-05-18 DIAGNOSIS — R0609 Other forms of dyspnea: Secondary | ICD-10-CM | POA: Diagnosis not present

## 2017-05-18 HISTORY — DX: Cardiac arrhythmia, unspecified: I49.9

## 2017-05-18 LAB — CBC
HCT: 39.7 % (ref 39.0–52.0)
HEMOGLOBIN: 13.5 g/dL (ref 13.0–17.0)
MCH: 31.1 pg (ref 26.0–34.0)
MCHC: 34 g/dL (ref 30.0–36.0)
MCV: 91.5 fL (ref 78.0–100.0)
PLATELETS: 276 10*3/uL (ref 150–400)
RBC: 4.34 MIL/uL (ref 4.22–5.81)
RDW: 13.5 % (ref 11.5–15.5)
WBC: 6.1 10*3/uL (ref 4.0–10.5)

## 2017-05-18 LAB — BASIC METABOLIC PANEL
ANION GAP: 6 (ref 5–15)
BUN: 30 mg/dL — ABNORMAL HIGH (ref 6–20)
CALCIUM: 9.2 mg/dL (ref 8.9–10.3)
CHLORIDE: 106 mmol/L (ref 101–111)
CO2: 26 mmol/L (ref 22–32)
CREATININE: 1.27 mg/dL — AB (ref 0.61–1.24)
GFR calc non Af Amer: 54 mL/min — ABNORMAL LOW (ref >60)
Glucose, Bld: 96 mg/dL (ref 65–99)
Potassium: 5.1 mmol/L (ref 3.5–5.1)
SODIUM: 138 mmol/L (ref 135–145)

## 2017-05-18 LAB — SURGICAL PCR SCREEN
MRSA, PCR: NEGATIVE
Staphylococcus aureus: NEGATIVE

## 2017-05-18 NOTE — Progress Notes (Signed)
BMP done 05/18/17 routed to Dr. Alvan Dame via epic

## 2017-05-23 NOTE — Anesthesia Preprocedure Evaluation (Addendum)
Anesthesia Evaluation  Patient identified by MRN, date of birth, ID band Patient awake    Reviewed: Allergy & Precautions, H&P , NPO status , Patient's Chart, lab work & pertinent test results, reviewed documented beta blocker date and time   Airway Mallampati: II  TM Distance: >3 FB Neck ROM: full    Dental no notable dental hx.    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  Normal left ventricular ejection fraction, EF 50-55%vc   Neuro/Psych negative neurological ROS  negative psych ROS   GI/Hepatic negative GI ROS, Neg liver ROS,   Endo/Other  negative endocrine ROS  Renal/GU negative Renal ROS  negative genitourinary   Musculoskeletal  (+) Arthritis ,   Abdominal   Peds  Hematology negative hematology ROS (+)   Anesthesia Other Findings   Reproductive/Obstetrics negative OB ROS                             Anesthesia Physical  Anesthesia Plan  ASA: II  Anesthesia Plan:    Post-op Pain Management:  Regional for Post-op pain   Induction:   PONV Risk Score and Plan: 2 and 3 and Ondansetron, Dexamethasone and Treatment may vary due to age or medical condition  Airway Management Planned:   Additional Equipment:   Intra-op Plan:   Post-operative Plan:   Informed Consent: I have reviewed the patients History and Physical, chart, labs and discussed the procedure including the risks, benefits and alternatives for the proposed anesthesia with the patient or authorized representative who has indicated his/her understanding and  acceptance.   Dental Advisory Given  Plan Discussed with: CRNA  Anesthesia Plan Comments:        Anesthesia Quick Evaluation

## 2017-05-24 ENCOUNTER — Encounter (HOSPITAL_COMMUNITY): Payer: Self-pay | Admitting: *Deleted

## 2017-05-24 ENCOUNTER — Observation Stay (HOSPITAL_COMMUNITY)
Admission: RE | Admit: 2017-05-24 | Discharge: 2017-05-25 | Disposition: A | Discharge status: Home / Self Care | Payer: Non-veteran care | Source: Ambulatory Visit | Attending: Orthopedic Surgery | Admitting: Orthopedic Surgery

## 2017-05-24 ENCOUNTER — Encounter (HOSPITAL_COMMUNITY)
Admission: RE | Disposition: A | Discharge status: Home / Self Care | Payer: Self-pay | Source: Ambulatory Visit | Attending: Orthopedic Surgery

## 2017-05-24 ENCOUNTER — Other Ambulatory Visit: Payer: Self-pay

## 2017-05-24 ENCOUNTER — Inpatient Hospital Stay (HOSPITAL_COMMUNITY): Payer: Non-veteran care | Admitting: Anesthesiology

## 2017-05-24 DIAGNOSIS — Z7901 Long term (current) use of anticoagulants: Secondary | ICD-10-CM | POA: Diagnosis not present

## 2017-05-24 DIAGNOSIS — Z6827 Body mass index (BMI) 27.0-27.9, adult: Secondary | ICD-10-CM | POA: Diagnosis not present

## 2017-05-24 DIAGNOSIS — M25762 Osteophyte, left knee: Secondary | ICD-10-CM | POA: Diagnosis not present

## 2017-05-24 DIAGNOSIS — Z79899 Other long term (current) drug therapy: Secondary | ICD-10-CM | POA: Diagnosis not present

## 2017-05-24 DIAGNOSIS — M25462 Effusion, left knee: Secondary | ICD-10-CM | POA: Diagnosis not present

## 2017-05-24 DIAGNOSIS — M1712 Unilateral primary osteoarthritis, left knee: Principal | ICD-10-CM | POA: Insufficient documentation

## 2017-05-24 DIAGNOSIS — Z8546 Personal history of malignant neoplasm of prostate: Secondary | ICD-10-CM | POA: Insufficient documentation

## 2017-05-24 DIAGNOSIS — Z96659 Presence of unspecified artificial knee joint: Secondary | ICD-10-CM

## 2017-05-24 DIAGNOSIS — M65862 Other synovitis and tenosynovitis, left lower leg: Secondary | ICD-10-CM | POA: Insufficient documentation

## 2017-05-24 DIAGNOSIS — I480 Paroxysmal atrial fibrillation: Secondary | ICD-10-CM | POA: Diagnosis not present

## 2017-05-24 DIAGNOSIS — E663 Overweight: Secondary | ICD-10-CM | POA: Diagnosis not present

## 2017-05-24 HISTORY — PX: TOTAL KNEE ARTHROPLASTY: SHX125

## 2017-05-24 LAB — TYPE AND SCREEN
ABO/RH(D): O POS
ANTIBODY SCREEN: NEGATIVE

## 2017-05-24 LAB — APTT: aPTT: 27 seconds (ref 24–36)

## 2017-05-24 SURGERY — ARTHROPLASTY, KNEE, TOTAL
Anesthesia: Spinal | Site: Knee | Laterality: Left

## 2017-05-24 MED ORDER — DEXAMETHASONE SODIUM PHOSPHATE 10 MG/ML IJ SOLN
INTRAMUSCULAR | Status: AC
  Filled 2017-05-24: qty 1

## 2017-05-24 MED ORDER — BUPIVACAINE IN DEXTROSE 0.75-8.25 % IT SOLN
INTRATHECAL | Status: DC | PRN
  Administered 2017-05-24: 2 mL via INTRATHECAL

## 2017-05-24 MED ORDER — FERROUS SULFATE 325 (65 FE) MG PO TABS
325.0000 mg | ORAL_TABLET | Freq: Three times a day (TID) | ORAL | Status: DC
  Administered 2017-05-25: 08:00:00 325 mg via ORAL
  Filled 2017-05-24: qty 1

## 2017-05-24 MED ORDER — FENTANYL CITRATE (PF) 100 MCG/2ML IJ SOLN: 100.0000 ug | Freq: Once | INTRAMUSCULAR | Status: DC

## 2017-05-24 MED ORDER — TRANEXAMIC ACID 1000 MG/10ML IV SOLN
2000.0000 mg | Freq: Once | INTRAVENOUS | Status: DC
  Filled 2017-05-24: qty 20

## 2017-05-24 MED ORDER — CEFAZOLIN SODIUM-DEXTROSE 2-4 GM/100ML-% IV SOLN
2.0000 g | INTRAVENOUS | Status: AC
  Administered 2017-05-24: 2 g via INTRAVENOUS

## 2017-05-24 MED ORDER — ALUM & MAG HYDROXIDE-SIMETH 200-200-20 MG/5ML PO SUSP: 15.0000 mL | ORAL | Status: DC | PRN

## 2017-05-24 MED ORDER — BISACODYL 10 MG RE SUPP: 10.0000 mg | Freq: Every day | RECTAL | Status: DC | PRN

## 2017-05-24 MED ORDER — ROPIVACAINE HCL 7.5 MG/ML IJ SOLN
INTRAMUSCULAR | Status: DC | PRN
  Administered 2017-05-24: 20 mL via PERINEURAL

## 2017-05-24 MED ORDER — TRANEXAMIC ACID 1000 MG/10ML IV SOLN
INTRAVENOUS | Status: AC | PRN
  Administered 2017-05-24: 2000 mg via TOPICAL

## 2017-05-24 MED ORDER — DEXAMETHASONE SODIUM PHOSPHATE 10 MG/ML IJ SOLN
10.0000 mg | Freq: Once | INTRAMUSCULAR | Status: AC
  Administered 2017-05-25: 10 mg via INTRAVENOUS
  Filled 2017-05-24: qty 1

## 2017-05-24 MED ORDER — 0.9 % SODIUM CHLORIDE (POUR BTL) OPTIME
TOPICAL | Status: DC | PRN
  Administered 2017-05-24: 1000 mL

## 2017-05-24 MED ORDER — KETOROLAC TROMETHAMINE 30 MG/ML IJ SOLN
INTRAMUSCULAR | Status: AC
  Filled 2017-05-24: qty 1

## 2017-05-24 MED ORDER — METOCLOPRAMIDE HCL 5 MG PO TABS: 5.0000 mg | ORAL_TABLET | Freq: Three times a day (TID) | ORAL | Status: DC | PRN

## 2017-05-24 MED ORDER — STERILE WATER FOR IRRIGATION IR SOLN
Status: DC | PRN
  Administered 2017-05-24: 2000 mL

## 2017-05-24 MED ORDER — FENTANYL CITRATE (PF) 100 MCG/2ML IJ SOLN
INTRAMUSCULAR | Status: DC | PRN
  Administered 2017-05-24: 50 ug via INTRAVENOUS

## 2017-05-24 MED ORDER — ONDANSETRON HCL 4 MG PO TABS: 4.0000 mg | ORAL_TABLET | Freq: Four times a day (QID) | ORAL | Status: DC | PRN

## 2017-05-24 MED ORDER — PHENOL 1.4 % MT LIQD: 1.0000 | OROMUCOSAL | Status: DC | PRN

## 2017-05-24 MED ORDER — ACETAMINOPHEN 10 MG/ML IV SOLN
INTRAVENOUS | Status: DC | PRN
  Administered 2017-05-24: 1000 mg via INTRAVENOUS

## 2017-05-24 MED ORDER — DIPHENHYDRAMINE HCL 12.5 MG/5ML PO ELIX
12.5000 mg | ORAL_SOLUTION | ORAL | Status: DC | PRN
Start: 2017-05-24 — End: 2017-05-25

## 2017-05-24 MED ORDER — ABIRATERONE ACETATE 250 MG PO TABS: 1000.0000 mg | ORAL_TABLET | Freq: Every day | ORAL | Status: DC

## 2017-05-24 MED ORDER — MIDAZOLAM HCL 2 MG/2ML IJ SOLN: 2.0000 mg | Freq: Once | INTRAMUSCULAR | Status: DC

## 2017-05-24 MED ORDER — DILTIAZEM HCL ER COATED BEADS 120 MG PO TB24
120.0000 mg | ORAL_TABLET | Freq: Every day | ORAL | Status: DC
  Filled 2017-05-24: qty 1

## 2017-05-24 MED ORDER — MIDAZOLAM HCL 5 MG/5ML IJ SOLN
INTRAMUSCULAR | Status: DC | PRN
  Administered 2017-05-24 (×2): 1 mg via INTRAVENOUS

## 2017-05-24 MED ORDER — METHOCARBAMOL 1000 MG/10ML IJ SOLN
500.0000 mg | Freq: Four times a day (QID) | INTRAVENOUS | Status: DC | PRN
  Filled 2017-05-24: qty 5

## 2017-05-24 MED ORDER — SODIUM CHLORIDE 0.9 % IJ SOLN
INTRAMUSCULAR | Status: AC
  Filled 2017-05-24: qty 50

## 2017-05-24 MED ORDER — METHOCARBAMOL 500 MG PO TABS
500.0000 mg | ORAL_TABLET | Freq: Four times a day (QID) | ORAL | Status: DC | PRN
  Administered 2017-05-24: 500 mg via ORAL
  Filled 2017-05-24 (×2): qty 1

## 2017-05-24 MED ORDER — MIDAZOLAM HCL 2 MG/2ML IJ SOLN
INTRAMUSCULAR | Status: AC
  Filled 2017-05-24: qty 2

## 2017-05-24 MED ORDER — ONDANSETRON HCL 4 MG/2ML IJ SOLN: 4.0000 mg | Freq: Four times a day (QID) | INTRAMUSCULAR | Status: DC | PRN

## 2017-05-24 MED ORDER — PROPOFOL 500 MG/50ML IV EMUL
INTRAVENOUS | Status: DC | PRN
  Administered 2017-05-24: 30 mg via INTRAVENOUS

## 2017-05-24 MED ORDER — SODIUM CHLORIDE 0.9 % IJ SOLN
INTRAMUSCULAR | Status: DC | PRN
  Administered 2017-05-24: 29 mL

## 2017-05-24 MED ORDER — BUPIVACAINE-EPINEPHRINE (PF) 0.25% -1:200000 IJ SOLN
INTRAMUSCULAR | Status: AC
  Filled 2017-05-24: qty 30

## 2017-05-24 MED ORDER — PROPOFOL 500 MG/50ML IV EMUL
INTRAVENOUS | Status: DC | PRN
  Administered 2017-05-24: 75 ug/kg/min via INTRAVENOUS

## 2017-05-24 MED ORDER — PREDNISONE 5 MG PO TABS
5.0000 mg | ORAL_TABLET | Freq: Every day | ORAL | Status: DC
  Administered 2017-05-25: 5 mg via ORAL
  Filled 2017-05-24: qty 1

## 2017-05-24 MED ORDER — KETOROLAC TROMETHAMINE 30 MG/ML IJ SOLN
INTRAMUSCULAR | Status: DC | PRN
  Administered 2017-05-24: 30 mg

## 2017-05-24 MED ORDER — MAGNESIUM CITRATE PO SOLN: 1.0000 | Freq: Once | ORAL | Status: DC | PRN

## 2017-05-24 MED ORDER — SODIUM CHLORIDE 0.9 % IR SOLN
Status: DC | PRN
  Administered 2017-05-24: 1000 mL

## 2017-05-24 MED ORDER — DEXAMETHASONE SODIUM PHOSPHATE 10 MG/ML IJ SOLN
10.0000 mg | Freq: Once | INTRAMUSCULAR | Status: AC
  Administered 2017-05-24: 10 mg via INTRAVENOUS

## 2017-05-24 MED ORDER — LACTATED RINGERS IV SOLN
INTRAVENOUS | Status: DC
  Administered 2017-05-24 (×2): via INTRAVENOUS

## 2017-05-24 MED ORDER — HYDROCODONE-ACETAMINOPHEN 7.5-325 MG PO TABS: 1.0000 | ORAL_TABLET | ORAL | Status: DC | PRN

## 2017-05-24 MED ORDER — CEFAZOLIN SODIUM-DEXTROSE 2-4 GM/100ML-% IV SOLN
INTRAVENOUS | Status: AC
  Filled 2017-05-24: qty 100

## 2017-05-24 MED ORDER — ACETAMINOPHEN 325 MG PO TABS: 650.0000 mg | ORAL_TABLET | ORAL | Status: DC | PRN

## 2017-05-24 MED ORDER — CHLORHEXIDINE GLUCONATE 4 % EX LIQD: 60.0000 mL | Freq: Once | CUTANEOUS | Status: DC

## 2017-05-24 MED ORDER — FENTANYL CITRATE (PF) 100 MCG/2ML IJ SOLN
INTRAMUSCULAR | Status: AC
  Filled 2017-05-24: qty 2

## 2017-05-24 MED ORDER — ZOLPIDEM TARTRATE 5 MG PO TABS
5.0000 mg | ORAL_TABLET | Freq: Every day | ORAL | Status: DC
  Administered 2017-05-24: 22:00:00 5 mg via ORAL
  Filled 2017-05-24: qty 1

## 2017-05-24 MED ORDER — ACETAMINOPHEN 10 MG/ML IV SOLN
INTRAVENOUS | Status: AC
  Filled 2017-05-24: qty 100

## 2017-05-24 MED ORDER — HYDROMORPHONE HCL 1 MG/ML IJ SOLN: 0.5000 mg | INTRAMUSCULAR | Status: DC | PRN

## 2017-05-24 MED ORDER — PROPOFOL 10 MG/ML IV BOLUS
INTRAVENOUS | Status: AC
  Filled 2017-05-24: qty 40

## 2017-05-24 MED ORDER — POLYETHYLENE GLYCOL 3350 17 G PO PACK
17.0000 g | PACK | Freq: Two times a day (BID) | ORAL | Status: DC
  Administered 2017-05-24: 22:00:00 17 g via ORAL
  Filled 2017-05-24: qty 1

## 2017-05-24 MED ORDER — DABIGATRAN ETEXILATE MESYLATE 150 MG PO CAPS
150.0000 mg | ORAL_CAPSULE | Freq: Two times a day (BID) | ORAL | Status: DC
  Administered 2017-05-25: 150 mg via ORAL
  Filled 2017-05-24: qty 1

## 2017-05-24 MED ORDER — MENTHOL 3 MG MT LOZG: 1.0000 | LOZENGE | OROMUCOSAL | Status: DC | PRN

## 2017-05-24 MED ORDER — BUPIVACAINE-EPINEPHRINE (PF) 0.25% -1:200000 IJ SOLN
INTRAMUSCULAR | Status: DC | PRN
  Administered 2017-05-24: 30 mL

## 2017-05-24 MED ORDER — HYDROMORPHONE HCL 1 MG/ML IJ SOLN: 0.2500 mg | INTRAMUSCULAR | Status: DC | PRN

## 2017-05-24 MED ORDER — CELECOXIB 200 MG PO CAPS
200.0000 mg | ORAL_CAPSULE | Freq: Two times a day (BID) | ORAL | Status: DC
  Administered 2017-05-24: 200 mg via ORAL
  Filled 2017-05-24: qty 1

## 2017-05-24 MED ORDER — HYDROCODONE-ACETAMINOPHEN 7.5-325 MG PO TABS
2.0000 | ORAL_TABLET | ORAL | Status: DC | PRN
  Administered 2017-05-24: 15:00:00 2 via ORAL
  Filled 2017-05-24 (×2): qty 2

## 2017-05-24 MED ORDER — PROMETHAZINE HCL 25 MG/ML IJ SOLN
6.2500 mg | INTRAMUSCULAR | Status: DC | PRN
Start: 2017-05-24 — End: 2017-05-24

## 2017-05-24 MED ORDER — SODIUM CHLORIDE 0.9 % IV SOLN
INTRAVENOUS | Status: DC
Start: 2017-05-24 — End: 2017-05-25
  Administered 2017-05-24: 12:00:00 via INTRAVENOUS

## 2017-05-24 MED ORDER — ONDANSETRON HCL 4 MG/2ML IJ SOLN
INTRAMUSCULAR | Status: AC
  Filled 2017-05-24: qty 2

## 2017-05-24 MED ORDER — ACETAMINOPHEN 650 MG RE SUPP: 650.0000 mg | RECTAL | Status: DC | PRN

## 2017-05-24 MED ORDER — GABAPENTIN 300 MG PO CAPS
300.0000 mg | ORAL_CAPSULE | Freq: Every day | ORAL | Status: DC
  Administered 2017-05-24: 22:00:00 300 mg via ORAL
  Filled 2017-05-24: qty 1

## 2017-05-24 MED ORDER — CEFAZOLIN SODIUM-DEXTROSE 2-4 GM/100ML-% IV SOLN
2.0000 g | Freq: Four times a day (QID) | INTRAVENOUS | Status: AC
  Administered 2017-05-24 (×2): 2 g via INTRAVENOUS
  Filled 2017-05-24 (×3): qty 100

## 2017-05-24 MED ORDER — METOCLOPRAMIDE HCL 5 MG/ML IJ SOLN: 5.0000 mg | Freq: Three times a day (TID) | INTRAMUSCULAR | Status: DC | PRN

## 2017-05-24 MED ORDER — MEPERIDINE HCL 50 MG/ML IJ SOLN: 6.2500 mg | INTRAMUSCULAR | Status: DC | PRN

## 2017-05-24 MED ORDER — ONDANSETRON HCL 4 MG/2ML IJ SOLN
INTRAMUSCULAR | Status: DC | PRN
  Administered 2017-05-24: 4 mg via INTRAVENOUS

## 2017-05-24 MED ORDER — DOCUSATE SODIUM 100 MG PO CAPS
100.0000 mg | ORAL_CAPSULE | Freq: Two times a day (BID) | ORAL | Status: DC
  Administered 2017-05-24 – 2017-05-25 (×2): 100 mg via ORAL
  Filled 2017-05-24 (×2): qty 1

## 2017-05-24 SURGICAL SUPPLY — 50 items
ADH SKN CLS APL DERMABOND .7 (GAUZE/BANDAGES/DRESSINGS) ×1
BAG DECANTER FOR FLEXI CONT (MISCELLANEOUS) ×2 IMPLANT
BAG SPEC THK2 15X12 ZIP CLS (MISCELLANEOUS)
BAG ZIPLOCK 12X15 (MISCELLANEOUS) IMPLANT
BANDAGE ACE 6X5 VEL STRL LF (GAUZE/BANDAGES/DRESSINGS) ×3 IMPLANT
BLADE SAW SGTL 11.0X1.19X90.0M (BLADE) IMPLANT
BLADE SAW SGTL 13.0X1.19X90.0M (BLADE) ×3 IMPLANT
BOWL SMART MIX CTS (DISPOSABLE) ×3 IMPLANT
CAPT KNEE TOTAL 3 ATTUNE ×2 IMPLANT
CEMENT HV SMART SET (Cement) ×6 IMPLANT
COVER SURGICAL LIGHT HANDLE (MISCELLANEOUS) ×3 IMPLANT
CUFF TOURN SGL QUICK 34 (TOURNIQUET CUFF) ×3
CUFF TRNQT CYL 34X4X40X1 (TOURNIQUET CUFF) ×1 IMPLANT
DECANTER SPIKE VIAL GLASS SM (MISCELLANEOUS) ×3 IMPLANT
DERMABOND ADVANCED (GAUZE/BANDAGES/DRESSINGS) ×2
DERMABOND ADVANCED .7 DNX12 (GAUZE/BANDAGES/DRESSINGS) ×1 IMPLANT
DRAPE U-SHAPE 47X51 STRL (DRAPES) ×3 IMPLANT
DRESSING AQUACEL AG SP 3.5X10 (GAUZE/BANDAGES/DRESSINGS) ×1 IMPLANT
DRSG AQUACEL AG SP 3.5X10 (GAUZE/BANDAGES/DRESSINGS) ×3
DURAPREP 26ML APPLICATOR (WOUND CARE) ×6 IMPLANT
ELECT REM PT RETURN 15FT ADLT (MISCELLANEOUS) ×3 IMPLANT
GLOVE BIO SURGEON STRL SZ7 (GLOVE) ×2 IMPLANT
GLOVE BIOGEL PI IND STRL 7.0 (GLOVE) IMPLANT
GLOVE BIOGEL PI IND STRL 7.5 (GLOVE) ×5 IMPLANT
GLOVE BIOGEL PI IND STRL 8.5 (GLOVE) ×1 IMPLANT
GLOVE BIOGEL PI INDICATOR 7.0 (GLOVE) ×2
GLOVE BIOGEL PI INDICATOR 7.5 (GLOVE) ×10
GLOVE BIOGEL PI INDICATOR 8.5 (GLOVE) ×4
GLOVE ECLIPSE 8.0 STRL XLNG CF (GLOVE) ×3 IMPLANT
GLOVE ORTHO TXT STRL SZ7.5 (GLOVE) ×6 IMPLANT
GLOVE SURG SS PI 7.5 STRL IVOR (GLOVE) ×4 IMPLANT
GOWN SPEC L3 XXLG W/TWL (GOWN DISPOSABLE) ×2 IMPLANT
GOWN STRL REUS W/ TWL XL LVL3 (GOWN DISPOSABLE) ×1 IMPLANT
GOWN STRL REUS W/TWL LRG LVL3 (GOWN DISPOSABLE) ×5 IMPLANT
GOWN STRL REUS W/TWL XL LVL3 (GOWN DISPOSABLE) ×6 IMPLANT
HANDPIECE INTERPULSE COAX TIP (DISPOSABLE) ×3
MANIFOLD NEPTUNE II (INSTRUMENTS) ×3 IMPLANT
PACK TOTAL KNEE CUSTOM (KITS) ×3 IMPLANT
POSITIONER SURGICAL ARM (MISCELLANEOUS) ×3 IMPLANT
SET HNDPC FAN SPRY TIP SCT (DISPOSABLE) ×1 IMPLANT
SET PAD KNEE POSITIONER (MISCELLANEOUS) ×3 IMPLANT
SUT MNCRL AB 4-0 PS2 18 (SUTURE) ×3 IMPLANT
SUT STRATAFIX 0 PDS 27 VIOLET (SUTURE) ×3
SUT VIC AB 1 CT1 36 (SUTURE) ×3 IMPLANT
SUT VIC AB 2-0 CT1 27 (SUTURE) ×9
SUT VIC AB 2-0 CT1 TAPERPNT 27 (SUTURE) ×3 IMPLANT
SUTURE STRATFX 0 PDS 27 VIOLET (SUTURE) ×1 IMPLANT
SYR 50ML LL SCALE MARK (SYRINGE) ×5 IMPLANT
WRAP KNEE MAXI GEL POST OP (GAUZE/BANDAGES/DRESSINGS) ×3 IMPLANT
YANKAUER SUCT BULB TIP 10FT TU (MISCELLANEOUS) ×3 IMPLANT

## 2017-05-24 NOTE — Anesthesia Procedure Notes (Signed)
Anesthesia Regional Block: Adductor canal block   Pre-Anesthetic Checklist: ,, timeout performed, Correct Patient, Correct Site, Correct Laterality, Correct Procedure, Correct Position, site marked, Risks and benefits discussed,  Surgical consent,  Pre-op evaluation,  At surgeon's request and post-op pain management  Laterality: Left  Prep: chloraprep       Needles:  Injection technique: Single-shot  Needle Type: Stimiplex     Needle Length: 9cm  Needle Gauge: 21     Additional Needles:   Procedures:,,,, ultrasound used (permanent image in chart),,,,  Narrative:  Start time: 05/24/2017 7:11 AM End time: 05/24/2017 7:13 AM Injection made incrementally with aspirations every 5 mL.  Performed by: Personally  Anesthesiologist: Nolon Nations, MD  Additional Notes: BP cuff, EKG monitors applied. Sedation begun. Artery and nerve location verified with U/S and anesthetic injected incrementally, slowly, and after negative aspirations under direct u/s guidance. Good fascial /perineural spread. Tolerated well.

## 2017-05-24 NOTE — Evaluation (Signed)
Physical Therapy Evaluation Patient Details Name: Connor Duffy MRN: [id number] DOB: October 30, 1940 Today's Date: 05/24/2017   History of Present Illness  L TKA, H/O DATHA  Clinical Impression  The patient ambulated x 120' with RW.  Plans OPPT. Pt admitted with above diagnosis. Pt currently with functional limitations due to the deficits listed below (see PT Problem List). Pt will benefit from skilled PT to increase their independence and safety with mobility to allow discharge to the venue listed below.       Follow Up Recommendations Outpatient PT;DC plan and follow up therapy as arranged by surgeon    Equipment Recommendations  None recommended by PT    Recommendations for Other Services       Precautions / Restrictions Precautions Precautions: Fall;Knee Restrictions LLE Weight Bearing: Weight bearing as tolerated      Mobility  Bed Mobility Overal bed mobility: Needs Assistance Bed Mobility: Supine to Sit     Supine to sit: Supervision        Transfers Overall transfer level: Needs assistance Equipment used: Rolling walker (2 wheeled) Transfers: Sit to/from Stand Sit to Stand: Min assist         General transfer comment: cues for hand and left leg position  Ambulation/Gait Ambulation/Gait assistance: Min assist Ambulation Distance (Feet): 120 Feet Assistive device: Rolling walker (2 wheeled) Gait Pattern/deviations: Step-to pattern;Step-through pattern     General Gait Details: cues for sequence  Stairs            Wheelchair Mobility    Modified Rankin (Stroke Patients Only)       Balance                                             Pertinent Vitals/Pain Pain Assessment: 0-10 Pain Score: 3  Pain Location: left knee Pain Descriptors / Indicators: Discomfort Pain Intervention(s): Patient requesting pain meds-RN notified;Ice applied    Home Living Family/patient expects to be discharged to:: Private residence Living  Arrangements: Spouse/significant other Available Help at Discharge: Family Type of Home: House Home Access: Level entry     Home Layout: One level Home Equipment: Environmental consultant - 2 wheels      Prior Function Level of Independence: Independent               Hand Dominance        Extremity/Trunk Assessment   Upper Extremity Assessment Upper Extremity Assessment: Overall WFL for tasks assessed    Lower Extremity Assessment Lower Extremity Assessment: LLE deficits/detail LLE Deficits / Details: + SLR ,knee  rom 10-60     Cervical / Trunk Assessment Cervical / Trunk Assessment: Normal  Communication   Communication: No difficulties  Cognition Arousal/Alertness: Awake/alert Behavior During Therapy: WFL for tasks assessed/performed Overall Cognitive Status: Within Functional Limits for tasks assessed                                        General Comments      Exercises Total Joint Exercises Quad Sets: AROM;Left;10 reps   Assessment/Plan    PT Assessment Patient needs continued PT services  PT Problem List Decreased strength;Decreased range of motion;Decreased activity tolerance;Decreased mobility;Decreased knowledge of precautions;Decreased safety awareness;Decreased knowledge of use of DME;Pain       PT Treatment Interventions DME instruction;Gait training;Stair  training;Functional mobility training;Patient/family education;Therapeutic activities;Therapeutic exercise    PT Goals (Current goals can be found in the Care Plan section)  Acute Rehab PT Goals Patient Stated Goal: to hike  PT Goal Formulation: With patient Time For Goal Achievement: 05/26/17 Potential to Achieve Goals: Good    Frequency 7X/week   Barriers to discharge        Co-evaluation               AM-PAC PT "6 Clicks" Daily Activity  Outcome Measure Difficulty turning over in bed (including adjusting bedclothes, sheets and blankets)?: None Difficulty moving from  lying on back to sitting on the side of the bed? : None Difficulty sitting down on and standing up from a chair with arms (e.g., wheelchair, bedside commode, etc,.)?: A Little Help needed moving to and from a bed to chair (including a wheelchair)?: A Little Help needed walking in hospital room?: A Little Help needed climbing 3-5 steps with a railing? : Total 6 Click Score: 18    End of Session   Activity Tolerance: Patient tolerated treatment well Patient left: in chair;with call bell/phone within reach Nurse Communication: Mobility status;Patient requests pain meds PT Visit Diagnosis: Unsteadiness on feet (R26.81);Pain Pain - Right/Left: Left Pain - part of body: Knee    Time: 6789-3810 PT Time Calculation (min) (ACUTE ONLY): 23 min   Charges:   PT Evaluation $PT Eval Low Complexity: 1 Low PT Treatments $Gait Training: 8-22 mins   PT G Codes:   PT G-Codes **NOT FOR INPATIENT CLASS** Functional Assessment Tool Used: Clinical judgement;AM-PAC 6 Clicks Basic Mobility Functional Limitation: Mobility: Walking and moving around Mobility: Walking and Moving Around Current Status (F7510): At least 20 percent but less than 40 percent impaired, limited or restricted Mobility: Walking and Moving Around Goal Status [phone]): At least 1 percent but less than 20 percent impaired, limited or restricted    [facility] PT [phone]   Claretha Cooper 05/24/2017, 3:20 PM

## 2017-05-24 NOTE — Anesthesia Procedure Notes (Signed)
Date/Time: 05/24/2017 7:00 AM Performed by: Glory Buff, CRNA Oxygen Delivery Method: Simple face mask

## 2017-05-24 NOTE — Transfer of Care (Signed)
Immediate Anesthesia Transfer of Care Note  Patient: Connor Duffy  Procedure(s) Performed: LEFT TOTAL KNEE ARTHROPLASTY (Left Knee)  Patient Location: PACU  Anesthesia Type:MAC and Spinal  Level of Consciousness: awake, alert  and oriented  Airway & Oxygen Therapy: Patient Spontanous Breathing and Patient connected to face mask oxygen  Post-op Assessment: Report given to RN and Post -op Vital signs reviewed and stable  Post vital signs: Reviewed and stable  Last Vitals:  Vitals:   05/24/17 0530  BP: 139/90  Pulse: 67  Resp: 18  Temp: 36.8 C  SpO2: 98%    Last Pain:  Vitals:   05/24/17 0530  TempSrc: Oral         Complications: No apparent anesthesia complications

## 2017-05-24 NOTE — Anesthesia Postprocedure Evaluation (Signed)
Anesthesia Post Note  Patient: Connor Duffy  Procedure(s) Performed: LEFT TOTAL KNEE ARTHROPLASTY (Left Knee)     Patient location during evaluation: PACU Anesthesia Type: Spinal Level of consciousness: awake and alert Pain management: pain level controlled Vital Signs Assessment: post-procedure vital signs reviewed and stable Respiratory status: spontaneous breathing and respiratory function stable Cardiovascular status: blood pressure returned to baseline and stable Postop Assessment: spinal receding Anesthetic complications: no    Last Vitals:  Vitals:   05/24/17 1215 05/24/17 1322  BP: (!) 148/86 137/72  Pulse: 65 69  Resp: 16 16  Temp: (!) 36.4 C (!) 36.4 C  SpO2: 100% 100%    Last Pain:  Vitals:   05/24/17 1115  TempSrc: Axillary  PainSc:                  Nolon Nations

## 2017-05-24 NOTE — Op Note (Signed)
NAME:  Connor Duffy                      MEDICAL RECORD NO.:  [id number]                             FACILITY:  [facility]      PHYSICIAN:  Pietro Cassis. Alvan Dame, M.D.  DATE OF BIRTH:  06/06/1941      DATE OF PROCEDURE:  05/24/2017                                     OPERATIVE REPORT         PREOPERATIVE DIAGNOSIS:  Left knee osteoarthritis.      POSTOPERATIVE DIAGNOSIS:  Left knee osteoarthritis.      FINDINGS:  The patient was noted to have complete loss of cartilage and   bone-on-bone arthritis with associated osteophytes in the medial and patellofemoral compartments of   the knee with a significant synovitis and associated effusion.      PROCEDURE:  Left total knee replacement.      COMPONENTS USED:  DePuy Attune rotating platform posterior stabilized knee   system, a size 7 femur, 8 tibia, size 6 mm PS AOX insert, and 38 anatomic patellar   button.      SURGEON:  Pietro Cassis. Alvan Dame, M.D.      ASSISTANT:  Danae Orleans, PA-C.      ANESTHESIA:  Regional and Spinal.      SPECIMENS:  None.      COMPLICATION:  None.      DRAINS:  None.  EBL: <200cc      TOURNIQUET TIME:   Total Tourniquet Time Documented: Thigh (Left) - 29 minutes Total: Thigh (Left) - 29 minutes  .      The patient was stable to the recovery room.      INDICATION FOR PROCEDURE:  Connor Duffy is a 76 y.o. male patient of   mine.  The patient had been seen, evaluated, and treated conservatively in the   office with medication, activity modification, and injections.  The patient had   radiographic changes of bone-on-bone arthritis with endplate sclerosis and osteophytes noted.      The patient failed conservative measures including medication, injections, and activity modification, and at this point was ready for more definitive measures.   Based on the radiographic changes and failed conservative measures, the patient   decided to proceed with total knee replacement.  Risks of infection,   DVT, component  failure, need for revision surgery, postop course, and   expectations were all   discussed and reviewed.  Consent was obtained for benefit of pain   relief.      PROCEDURE IN DETAIL:  The patient was brought to the operative theater.   Once adequate anesthesia, preoperative antibiotics, 2 gm of Ancef and Decadron (TXA topical) administered, the patient was positioned supine with the left thigh tourniquet placed.  The  left lower extremity was prepped and draped in sterile fashion.  A time-   out was performed identifying the patient, planned procedure, and   extremity.      The left lower extremity was placed in the [facility] leg holder.  The leg was   exsanguinated, tourniquet elevated to 250 mmHg.  A midline incision was   made followed by median parapatellar arthrotomy.  Following initial   exposure, attention was first directed to the patella.  Precut   measurement was noted to be 29 mm.  I resected down to 17 mm and used a   38 anatomic patellar button to restore patellar height as well as cover the cut   surface.      The lug holes were drilled and a metal shim was placed to protect the   patella from retractors and saw blades.      At this point, attention was now directed to the femur.  The femoral   canal was opened with a drill, irrigated to try to prevent fat emboli.  An   intramedullary rod was passed at 5 degrees valgus, 9 mm of bone was   resected off the distal femur.  Following this resection, the tibia was   subluxated anteriorly.  Using the extramedullary guide, 2 mm of bone was resected off   the proximal medial tibia.  We confirmed the gap would be   stable medially and laterally with a size 5 spacer block as well as confirmed   the cut was perpendicular in the coronal plane, checking with an alignment rod.      Once this was done, I sized the femur to be a size 7 in the anterior-   posterior dimension, chose a standard component based on medial and   lateral  dimension.  The size 7 rotation block was then pinned in   position anterior referenced using the C-clamp to set rotation.  The   anterior, posterior, and  chamfer cuts were made without difficulty nor   notching making certain that I was along the anterior cortex to help   with flexion gap stability.      The final box cut was made off the lateral aspect of distal femur.      At this point, the tibia was sized to be a size 8, the size 8 tray was   then pinned in position through the medial third of the tubercle,   drilled, and keel punched.  Trial reduction was now carried with a 7 femur,  8 tibia, a size 5 then 6 mm PS insert, and the 38 anatomic patella botton.  The knee was brought to   extension, full extension with good flexion stability with the patella   tracking through the trochlea without application of pressure.  Given   all these findings the femoral lug holes were drilled and then the trial components removed.  Final components were   opened and cement was mixed.  The knee was irrigated with normal saline   solution and pulse lavage.  The synovial lining was   then injected with 30 cc of 0.25% Marcaine with epinephrine and 1 cc of Toradol plus 30 cc of NS for a total of 61 cc.      The knee was irrigated.  Final implants were then cemented onto clean and   dried cut surfaces of bone with the knee brought to extension with a 6 mm PS trial insert.      Once the cement had fully cured, the excess cement was removed   throughout the knee.  I confirmed I was satisfied with the range of   motion and stability, and the final size 6 mm PS AOX insert was chosen.  It was   placed into the knee.      The tourniquet had been let down at 29 minutes.  No significant  hemostasis required.  The   extensor mechanism was then reapproximated using #1 Vicryl and #0 Stratafix sutures with the knee   in flexion.  The   remaining wound was closed with 2-0 Vicryl and running 4-0 Monocryl.    The knee was cleaned, dried, dressed sterilely using Dermabond and   Aquacel dressing.  The patient was then   brought to recovery room in stable condition, tolerating the procedure   well.   Please note that Physician Assistant, Danae Orleans, PA-C, was present for the entirety of the case, and was utilized for pre-operative positioning, peri-operative retractor management, general facilitation of the procedure.  He was also utilized for primary wound closure at the end of the case.              Pietro Cassis Alvan Dame, M.D.    05/24/2017 8:38 AM

## 2017-05-24 NOTE — Interval H&P Note (Signed)
History and Physical Interval Note:  05/24/2017 7:02 AM  Connor Duffy  has presented today for surgery, with the diagnosis of Left knee osteoarthritis  The various methods of treatment have been discussed with the patient and family. After consideration of risks, benefits and other options for treatment, the patient has consented to  Procedure(s) with comments: LEFT TOTAL KNEE ARTHROPLASTY (Left) - 70 mins as a surgical intervention .  The patient's history has been reviewed, patient examined, no change in status, stable for surgery.  I have reviewed the patient's chart and labs.  Questions were answered to the patient's satisfaction.     Mauri Pole

## 2017-05-24 NOTE — Anesthesia Procedure Notes (Signed)
Spinal  Patient location during procedure: OR Start time: 05/24/2017 7:22 AM End time: 05/24/2017 7:28 AM Staffing Anesthesiologist: Nolon Nations, MD Resident/CRNA: Glory Buff, CRNA Performed: resident/CRNA  Preanesthetic Checklist Completed: patient identified, site marked, surgical consent, pre-op evaluation, timeout performed, IV checked, risks and benefits discussed and monitors and equipment checked Spinal Block Patient position: sitting Prep: ChloraPrep Patient monitoring: heart rate, continuous pulse ox and blood pressure Approach: right paramedian Location: L2-3 Injection technique: single-shot Needle Needle type: Pencan  Needle gauge: 24 G Needle length: 9 cm Needle insertion depth: 6 cm Assessment Sensory level: T6 Additional Notes Kit expiration date checked and verified. Stick X 1 midline without success, Stick x 1 R paramedian,  - paraesthesia, - heme, +CSF, patient tolerated well.

## 2017-05-24 NOTE — Discharge Instructions (Signed)

## 2017-05-25 ENCOUNTER — Encounter (HOSPITAL_COMMUNITY): Payer: Self-pay | Admitting: Orthopedic Surgery

## 2017-05-25 DIAGNOSIS — M1712 Unilateral primary osteoarthritis, left knee: Secondary | ICD-10-CM | POA: Diagnosis not present

## 2017-05-25 DIAGNOSIS — E663 Overweight: Secondary | ICD-10-CM | POA: Diagnosis present

## 2017-05-25 LAB — CBC
HCT: 33.3 % — ABNORMAL LOW (ref 39.0–52.0)
HEMOGLOBIN: 11.3 g/dL — AB (ref 13.0–17.0)
MCH: 30.7 pg (ref 26.0–34.0)
MCHC: 33.9 g/dL (ref 30.0–36.0)
MCV: 90.5 fL (ref 78.0–100.0)
PLATELETS: 202 10*3/uL (ref 150–400)
RBC: 3.68 MIL/uL — AB (ref 4.22–5.81)
RDW: 13.4 % (ref 11.5–15.5)
WBC: 9.7 10*3/uL (ref 4.0–10.5)

## 2017-05-25 LAB — BASIC METABOLIC PANEL
Anion gap: 5 (ref 5–15)
BUN: 24 mg/dL — AB (ref 6–20)
CHLORIDE: 107 mmol/L (ref 101–111)
CO2: 25 mmol/L (ref 22–32)
CREATININE: 1.09 mg/dL (ref 0.61–1.24)
Calcium: 8.6 mg/dL — ABNORMAL LOW (ref 8.9–10.3)
GFR calc Af Amer: >60 mL/min (ref >60)
GFR calc non Af Amer: >60 mL/min (ref >60)
GLUCOSE: 138 mg/dL — AB (ref 65–99)
POTASSIUM: 4.4 mmol/L (ref 3.5–5.1)
Sodium: 137 mmol/L (ref 135–145)

## 2017-05-25 MED ORDER — METHOCARBAMOL 500 MG PO TABS: 500.0000 mg | ORAL_TABLET | Freq: Four times a day (QID) | ORAL | 0 refills | Status: DC | PRN

## 2017-05-25 MED ORDER — DOCUSATE SODIUM 100 MG PO CAPS: 100.0000 mg | ORAL_CAPSULE | Freq: Two times a day (BID) | ORAL | 0 refills | Status: DC

## 2017-05-25 MED ORDER — POLYETHYLENE GLYCOL 3350 17 G PO PACK: 17.0000 g | PACK | Freq: Two times a day (BID) | ORAL | 0 refills | Status: DC

## 2017-05-25 MED ORDER — FERROUS SULFATE 325 (65 FE) MG PO TABS: 325.0000 mg | ORAL_TABLET | Freq: Three times a day (TID) | ORAL | 3 refills | Status: DC

## 2017-05-25 MED ORDER — HYDROCODONE-ACETAMINOPHEN 7.5-325 MG PO TABS: 1.0000 | ORAL_TABLET | ORAL | 0 refills | Status: DC | PRN

## 2017-05-25 MED ORDER — DILTIAZEM HCL ER COATED BEADS 120 MG PO CP24
120.0000 mg | ORAL_CAPSULE | Freq: Every day | ORAL | Status: DC
  Administered 2017-05-25: 120 mg via ORAL
  Filled 2017-05-25: qty 1

## 2017-05-25 NOTE — Progress Notes (Signed)
Physical Therapy Treatment Patient Details Name: Connor Duffy MRN: [id number] DOB: [birth year] Today's Date: 05/25/2017    History of Present Illness L TKA, H/O DATHA    PT Comments    Ready for Dc.   Follow Up Recommendations  Outpatient PT;DC plan and follow up therapy as arranged by surgeon     Equipment Recommendations  None recommended by PT    Recommendations for Other Services       Precautions / Restrictions Precautions Precautions: Fall;Knee Restrictions Weight Bearing Restrictions: No LLE Weight Bearing: Weight bearing as tolerated    Mobility  Bed Mobility Overal bed mobility: Needs Assistance Bed Mobility: Supine to Sit     Supine to sit: Supervision     General bed mobility comments: pt in chair  Transfers Overall transfer level: Needs assistance Equipment used: Rolling walker (2 wheeled) Transfers: Sit to/from Stand Sit to Stand: Supervision         General transfer comment: cues for hand and left leg position  Ambulation/Gait Ambulation/Gait assistance: Supervision Ambulation Distance (Feet): 200 Feet   Gait Pattern/deviations: Step-through pattern     General Gait Details: cues for sequence   Stairs            Wheelchair Mobility    Modified Rankin (Stroke Patients Only)       Balance                                            Cognition Arousal/Alertness: Awake/alert Behavior During Therapy: WFL for tasks assessed/performed Overall Cognitive Status: Within Functional Limits for tasks assessed                                        Exercises Total Joint Exercises Ankle Circles/Pumps: AROM;Both;10 reps Quad Sets: AROM;Both;10 reps Towel Squeeze: AROM;Both;10 reps Short Arc Quad: AROM;Left Heel Slides: AROM;Left;10 reps Hip ABduction/ADduction: AROM;Left;10 reps Straight Leg Raises: AROM Long Arc Quad: AROM;Left;10 reps Knee Flexion: AROM;10 reps;Left Goniometric ROM: 5-80  left knee flexion    General Comments        Pertinent Vitals/Pain Pain Assessment: Faces Pain Score: 2  Faces Pain Scale: Hurts little more Pain Location: left knee Pain Descriptors / Indicators: Discomfort Pain Intervention(s): Monitored during session    Home Living Family/patient expects to be discharged to:: Private residence Living Arrangements: Spouse/significant other Available Help at Discharge: Family;Available 24 hours/day Type of Home: House Home Access: Level entry   Home Layout: One level Home Equipment: Walker - 2 wheels;Walker - 4 wheels;Adaptive equipment      Prior Function Level of Independence: Independent          PT Goals (current goals can now be found in the care plan section) Acute Rehab PT Goals Patient Stated Goal: to hike in [place] in May Progress towards PT goals: Progressing toward goals    Frequency           PT Plan Current plan remains appropriate    Co-evaluation              AM-PAC PT "6 Clicks" Daily Activity  Outcome Measure  Difficulty turning over in bed (including adjusting bedclothes, sheets and blankets)?: None Difficulty moving from lying on back to sitting on the side of the bed? : None Difficulty sitting down on and standing  up from a chair with arms (e.g., wheelchair, bedside commode, etc,.)?: None Help needed moving to and from a bed to chair (including a wheelchair)?: None Help needed walking in hospital room?: None Help needed climbing 3-5 steps with a railing? : A Little 6 Click Score: 23    End of Session   Activity Tolerance: Patient tolerated treatment well Patient left: in chair;with call bell/phone within reach Nurse Communication: Mobility status;Patient requests pain meds PT Visit Diagnosis: Unsteadiness on feet (R26.81);Pain Pain - Right/Left: Left Pain - part of body: Knee     Time: 0918-0950 PT Time Calculation (min) (ACUTE ONLY): 32 min  Charges:  $Gait Training: 8-22  mins $Therapeutic Exercise: 8-22 mins                    G Codes:       {   Claretha Cooper 05/25/2017, 11:55 AM

## 2017-05-25 NOTE — Evaluation (Signed)
Occupational Therapy Evaluation and Discharge Patient Details Name: Connor Duffy MRN: [id number] DOB: 1940/10/04 Today's Date: 05/25/2017    History of Present Illness L TKA, H/O DATHA   Clinical Impression   All education completed. Pt and wife are knowledgeable in compensatory strategies for LB ADL, toilet and shower transfers, multiple uses of 3 in 1, and fall prevention. No further OT needs.    Follow Up Recommendations  No OT follow up    Equipment Recommendations  3 in 1 bedside commode    Recommendations for Other Services       Precautions / Restrictions Precautions Precautions: Fall;Knee Restrictions Weight Bearing Restrictions: No LLE Weight Bearing: Weight bearing as tolerated      Mobility Bed Mobility               General bed mobility comments: pt in chair  Transfers Overall transfer level: Needs assistance Equipment used: Rolling walker (2 wheeled) Transfers: Sit to/from Stand Sit to Stand: Supervision              Balance                                           ADL either performed or assessed with clinical judgement   ADL Overall ADL's : Needs assistance/impaired Eating/Feeding: Independent;Sitting   Grooming: Wash/dry hands;Standing   Upper Body Bathing: Set up;Sitting   Lower Body Bathing: Minimal assistance;Sit to/from stand Lower Body Bathing Details (indicate cue type and reason): educated in use of long handled bath sponge Upper Body Dressing : Set up;Sitting   Lower Body Dressing: Minimal assistance;Sit to/from stand Lower Body Dressing Details (indicate cue type and reason): educated in compensatory strategies and use of AE Toilet Transfer: Engineer, structural Details (indicate cue type and reason): educated in use of 3 in 1 to elevate toilet Toileting- Clothing Manipulation and Hygiene: Supervision/safety;Sit to/from stand   Tub/ Shower Transfer:  Supervision/safety;Rolling walker;Ambulation;3 in 1 Tub/Shower Transfer Details (indicate cue type and reason): educated in technique and use of 3 in 1 as shower seat Functional mobility during ADLs: Supervision/safety;Rolling walker General ADL Comments: Educated in transporting items safely with walker and safe footwear.     Vision Baseline Vision/History: Wears glasses Wears Glasses: At all times Patient Visual Report: No change from baseline       Perception     Praxis      Pertinent Vitals/Pain Pain Assessment: Faces Faces Pain Scale: Hurts little more Pain Location: left knee Pain Descriptors / Indicators: Discomfort Pain Intervention(s): Monitored during session;Premedicated before session;Repositioned     Hand Dominance Right   Extremity/Trunk Assessment Upper Extremity Assessment Upper Extremity Assessment: Overall WFL for tasks assessed   Lower Extremity Assessment Lower Extremity Assessment: Defer to PT evaluation   Cervical / Trunk Assessment Cervical / Trunk Assessment: Normal   Communication Communication Communication: No difficulties   Cognition Arousal/Alertness: Awake/alert Behavior During Therapy: WFL for tasks assessed/performed Overall Cognitive Status: Within Functional Limits for tasks assessed                                     General Comments       Exercises     Shoulder Instructions      Home Living Family/patient expects to be discharged to:: Private residence Living Arrangements: Spouse/significant other Available Help  at Discharge: Family;Available 24 hours/day Type of Home: House Home Access: Level entry     Home Layout: One level     Bathroom Shower/Tub: Occupational psychologist: Handicapped height     Home Equipment: Environmental consultant - 2 wheels;Walker - 4 wheels;Adaptive equipment Adaptive Equipment: Reacher        Prior Functioning/Environment Level of Independence: Independent                  OT Problem List: Pain      OT Treatment/Interventions:      OT Goals(Current goals can be found in the care plan section) Acute Rehab OT Goals Patient Stated Goal: to hike in [place] in May  OT Frequency:     Barriers to D/C:            Co-evaluation              AM-PAC PT "6 Clicks" Daily Activity     Outcome Measure Help from another person eating meals?: None Help from another person taking care of personal grooming?: A Little Help from another person toileting, which includes using toliet, bedpan, or urinal?: A Little Help from another person bathing (including washing, rinsing, drying)?: A Little Help from another person to put on and taking off regular upper body clothing?: None Help from another person to put on and taking off regular lower body clothing?: A Little 6 Click Score: 20   End of Session    Activity Tolerance: Patient tolerated treatment well Patient left: in chair;with call bell/phone within reach;with family/visitor present  OT Visit Diagnosis: Unsteadiness on feet (R26.81)                Time: 0932-6712 OT Time Calculation (min): 24 min Charges:  OT General Charges $OT Visit: 1 Visit OT Evaluation $OT Eval Moderate Complexity: 1 Mod OT Treatments $Self Care/Home Management : 8-22 mins G-Codes: OT G-codes **NOT FOR INPATIENT CLASS** Functional Assessment Tool Used: Clinical judgement Functional Limitation: Self care Self Care Current Status (W5809): At least 20 percent but less than 40 percent impaired, limited or restricted Self Care Discharge Status [phone]): At least 20 percent but less than 40 percent impaired, limited or restricted   05/25/2017 Nestor Lewandowsky, OTR/L Pager: [phone] Malka So 05/25/2017, 10:27 AM

## 2017-05-25 NOTE — Progress Notes (Signed)
     Subjective: 1 Day Post-Op Procedure(s) (LRB): LEFT TOTAL KNEE ARTHROPLASTY (Left)   Patient reports pain as mild, no events throughout the night.  Really that the knee is doing really well and looking forward to progressing.  Ready to be discharged home.   Objective:   VITALS:   Vitals:   05/25/17 0544 05/25/17 0754  BP: (!) 144/84 (!) 154/80  Pulse: 61   Resp: 16   Temp: 98 F (36.7 C)   SpO2: 98%     Dorsiflexion/Plantar flexion intact Incision: dressing C/D/I No cellulitis present Compartment soft  LABS Recent Labs    05/25/17 0533  HGB 11.3*  HCT 33.3*  WBC 9.7  PLT 202    Recent Labs    05/25/17 0533  NA 137  K 4.4  BUN 24*  CREATININE 1.09  GLUCOSE 138*     Assessment/Plan: 1 Day Post-Op Procedure(s) (LRB): LEFT TOTAL KNEE ARTHROPLASTY (Left) Foley cath d/c'ed Advance diet Up with therapy D/C IV fluids Discharge home Follow up in 2 weeks at [facility]. Follow up with OLIN,Arnola Crittendon D in 2 weeks.  Contact information:  [facility] [address], [place] [phone]    Overweight (BMI 25-29.9) Estimated body mass index is 27.06 kg/m as calculated from the following:   Height as of this encounter: 5\' 11"  (1.803 m).   Weight as of this encounter: 88 kg (194 lb). Patient also counseled that weight may inhibit the healing process Patient counseled that losing weight will help with future health issues         [place] Pugh. [place] Doig   PAC  05/25/2017, 9:43 AM

## 2017-05-25 NOTE — Progress Notes (Signed)
Discharge planning, no HH needs identified. Plan for OP PT, has rolling walker, needs 3n1. Contacted [facility] to deliver to room. [phone]

## 2017-05-26 NOTE — Discharge Summary (Signed)
Physician Discharge Summary  Patient ID: Connor Duffy MRN: [id number] DOB/AGE: February 02, 1941 76 y.o.  Admit date: 05/24/2017 Discharge date: 05/25/2017   Procedures:  Procedure(s) (LRB): LEFT TOTAL KNEE ARTHROPLASTY (Left)  Attending Physician:  Dr. Paralee Cancel   Admission Diagnoses:   Left knee primary OA / pain  Discharge Diagnoses:  Principal Problem:   S/P left TKA Active Problems:   Overweight (BMI 25.0-29.9)  Past Medical History:  Diagnosis Date  . Arthritis   . Atrial fibrillation ([place])   . Cancer ([place])    PROSTATE  . Dysrhythmia   . Spinal stenosis     HPI:    Connor Duffy, 76 y.o. male, has a history of pain and functional disability in the left knee due to arthritis and has failed non-surgical conservative treatments for greater than 12 weeks to include corticosteriod injections, viscosupplementation injections and activity modification.  Onset of symptoms was gradual, starting ~3 years ago with gradually worsening course since that time. The patient noted no past surgery on the left knee(s).  Patient currently rates pain in the left knee(s) at 6 out of 10 with activity. Patient has worsening of pain with activity and weight bearing, pain that interferes with activities of daily living, pain with passive range of motion, crepitus and joint swelling.  Patient has evidence of periarticular osteophytes and joint space narrowing by imaging studies.  There is no active infection.   Risks, benefits and expectations were discussed with the patient.  Risks including but not limited to the risk of anesthesia, blood clots, nerve damage, blood vessel damage, failure of the prosthesis, infection and up to and including death.  Patient understand the risks, benefits and expectations and wishes to proceed with surgery.   PCP: System, Provider Not In   Discharged Condition: good  Hospital Course:  Patient underwent the above stated procedure on 05/24/2017. Patient tolerated the  procedure well and brought to the recovery room in good condition and subsequently to the floor.  POD #1 BP: 154/80 ; Pulse: 61 ; Temp: 98 F (36.7 C) ; Resp: 16 Patient reports pain as mild, no events throughout the night.  Really that the knee is doing really well and looking forward to progressing.  Ready to be discharged home.  Dorsiflexion/plantar flexion intact, incision: dressing C/D/I, no cellulitis present and compartment soft.   LABS  Basename    HGB     11.3  HCT     33.3    Discharge Exam: General appearance: alert, cooperative and no distress Extremities: Homans sign is negative, no sign of DVT, no edema, redness or tenderness in the calves or thighs and no ulcers, gangrene or trophic changes  Disposition: Home with follow up in 2 weeks   Follow-up Information    Paralee Cancel, MD. Schedule an appointment as soon as possible for a visit in 2 week(s).   Specialty:  Orthopedic Surgery Contact information: [address] [place] [address] [phone]        [facility] - Dme Follow up.   Why:  bedside commode Contact information: [address] [place] [address] [phone]           Discharge Instructions    Call MD / Call 911   Complete by:  As directed    If you experience chest pain or shortness of breath, CALL 911 and be transported to the hospital emergency room.  If you develope a fever above 101 F, pus (Meyn drainage)  or increased drainage or redness at the wound, or calf pain, call your surgeon's office.   Change dressing   Complete by:  As directed    Maintain surgical dressing until follow up in the clinic. If the edges start to pull up, may reinforce with tape. If the dressing is no longer working, may remove and cover with gauze and tape, but must keep the area dry and clean.  Call with any questions or concerns.   Constipation Prevention   Complete by:  As directed    Drink plenty of fluids.  Prune  juice may be helpful.  You may use a stool softener, such as Colace (over the counter) 100 mg twice a day.  Use MiraLax (over the counter) for constipation as needed.   Diet - low sodium heart healthy   Complete by:  As directed    Discharge instructions   Complete by:  As directed    Maintain surgical dressing until follow up in the clinic. If the edges start to pull up, may reinforce with tape. If the dressing is no longer working, may remove and cover with gauze and tape, but must keep the area dry and clean.  Follow up in 2 weeks at [facility]. Call with any questions or concerns.   Increase activity slowly as tolerated   Complete by:  As directed    Weight bearing as tolerated with assist device (walker, cane, etc) as directed, use it as long as suggested by your surgeon or therapist, typically at least 4-6 weeks.   TED hose   Complete by:  As directed    Use stockings (TED hose) for 2 weeks on both leg(s).  You may remove them at night for sleeping.      Allergies as of 05/25/2017   No Known Allergies     Medication List    STOP taking these medications   enoxaparin 40 MG/0.4ML injection Commonly known as:  LOVENOX     TAKE these medications   dabigatran 150 MG Caps capsule Commonly known as:  PRADAXA Take 150 mg 2 (two) times daily by mouth.   diltiazem 120 MG 24 hr tablet Commonly known as:  CARDIZEM LA Take 120 mg by mouth daily.   docusate sodium 100 MG capsule Commonly known as:  COLACE Take 1 capsule (100 mg total) by mouth 2 (two) times daily.   ferrous sulfate 325 (65 FE) MG tablet Take 1 tablet (325 mg total) by mouth 3 (three) times daily after meals.   gabapentin 300 MG capsule Commonly known as:  NEURONTIN Take 300 mg at bedtime by mouth.   HYDROcodone-acetaminophen 7.5-325 MG tablet Commonly known as:  NORCO Take 1-2 tablets by mouth every 4 (four) hours as needed for moderate pain ((score 4 to 6)).   methocarbamol 500 MG  tablet Commonly known as:  ROBAXIN Take 1 tablet (500 mg total) by mouth every 6 (six) hours as needed for muscle spasms.   polyethylene glycol packet Commonly known as:  MIRALAX / GLYCOLAX Take 17 g by mouth 2 (two) times daily.   predniSONE 5 MG tablet Commonly known as:  DELTASONE Take 5 mg daily with breakfast by mouth.   zolpidem 5 MG tablet Commonly known as:  AMBIEN Take 5 mg at bedtime by mouth.   ZYTIGA 250 MG tablet Generic drug:  abiraterone acetate Take 1,000 mg daily by mouth. Take on an empty stomach 1 hour before or 2 hours after a meal  Discharge Care Instructions  (From admission, onward)        Start     Ordered   05/25/17 0000  Change dressing    Comments:  Maintain surgical dressing until follow up in the clinic. If the edges start to pull up, may reinforce with tape. If the dressing is no longer working, may remove and cover with gauze and tape, but must keep the area dry and clean.  Call with any questions or concerns.   05/25/17 3903       Signed: [place]. Krislynn Gronau   PA-C  05/26/2017, 10:47 AM

## 2017-08-02 DIAGNOSIS — L570 Actinic keratosis: Secondary | ICD-10-CM | POA: Diagnosis not present

## 2017-08-02 DIAGNOSIS — D225 Melanocytic nevi of trunk: Secondary | ICD-10-CM | POA: Diagnosis not present

## 2017-08-02 DIAGNOSIS — Z23 Encounter for immunization: Secondary | ICD-10-CM | POA: Diagnosis not present

## 2017-08-02 DIAGNOSIS — Z85828 Personal history of other malignant neoplasm of skin: Secondary | ICD-10-CM | POA: Diagnosis not present

## 2017-08-02 DIAGNOSIS — C44612 Basal cell carcinoma of skin of right upper limb, including shoulder: Secondary | ICD-10-CM | POA: Diagnosis not present

## 2017-08-02 DIAGNOSIS — D2272 Melanocytic nevi of left lower limb, including hip: Secondary | ICD-10-CM | POA: Diagnosis not present

## 2017-08-02 DIAGNOSIS — B353 Tinea pedis: Secondary | ICD-10-CM | POA: Diagnosis not present

## 2017-08-02 DIAGNOSIS — D485 Neoplasm of uncertain behavior of skin: Secondary | ICD-10-CM | POA: Diagnosis not present

## 2017-08-02 DIAGNOSIS — Z86018 Personal history of other benign neoplasm: Secondary | ICD-10-CM | POA: Diagnosis not present

## 2017-08-02 DIAGNOSIS — L821 Other seborrheic keratosis: Secondary | ICD-10-CM | POA: Diagnosis not present

## 2017-08-30 DIAGNOSIS — C44612 Basal cell carcinoma of skin of right upper limb, including shoulder: Secondary | ICD-10-CM | POA: Diagnosis not present

## 2017-12-23 DIAGNOSIS — Z800 Family history of malignant neoplasm of digestive organs: Secondary | ICD-10-CM | POA: Diagnosis not present

## 2017-12-23 DIAGNOSIS — Z86010 Personal history of colonic polyps: Secondary | ICD-10-CM | POA: Diagnosis not present

## 2017-12-23 DIAGNOSIS — K5790 Diverticulosis of intestine, part unspecified, without perforation or abscess without bleeding: Secondary | ICD-10-CM

## 2017-12-23 DIAGNOSIS — K635 Polyp of colon: Secondary | ICD-10-CM | POA: Diagnosis not present

## 2017-12-23 HISTORY — DX: Diverticulosis of intestine, part unspecified, without perforation or abscess without bleeding: K57.90

## 2017-12-27 DIAGNOSIS — K635 Polyp of colon: Secondary | ICD-10-CM | POA: Diagnosis not present

## 2018-02-16 DIAGNOSIS — M545 Low back pain: Secondary | ICD-10-CM | POA: Diagnosis not present

## 2018-02-20 ENCOUNTER — Encounter: Payer: Self-pay | Admitting: Cardiology

## 2018-02-21 ENCOUNTER — Ambulatory Visit (INDEPENDENT_AMBULATORY_CARE_PROVIDER_SITE_OTHER): Payer: Medicare Other | Admitting: Cardiology

## 2018-02-21 ENCOUNTER — Encounter: Payer: Self-pay | Admitting: Cardiology

## 2018-02-21 VITALS — BP 118/70 | HR 68 | Ht 71.0 in | Wt 196.0 lb

## 2018-02-21 DIAGNOSIS — I480 Paroxysmal atrial fibrillation: Secondary | ICD-10-CM | POA: Diagnosis not present

## 2018-02-21 NOTE — Patient Instructions (Signed)

## 2018-02-21 NOTE — Progress Notes (Signed)
[facility]. [address] [place] [place], [place]  [address] Phone: [phone] Fax:  [phone]  Date:  02/21/2018   ID:  Connor Duffy, DOB Nov 12, 1940, MRN [id number]  PCP:  System, Pcp Not In   History of Present Illness: Connor Duffy is a 77 y.o. male new onset dyspnea on exertion, fatigue, lightheadedness that was unusual for him during one of his extensive hiking trips. He had his wife go ahead as he continued at a slower pace. Denied any chest discomfort but he felt some shortness of breath and unsettling sensation that was unusual for him. He does not drink significant alcohol, no recent decongestants, no recent surgeries, fevers, chills, cough. No recent skin or hair changes. He was sent over for an exercise treadmill test by Dr. Baldemar Lenis.  Interestingly, as we were obtaining the original EKG, he demonstrated atrial fibrillation with rapid ventricular response, heart rate of approximate 115 beats per minute. This was a new diagnosis for him. He said he felt perhaps slightly lightheaded here in our office. While I was talking with him then auscultating him, he felt once again regular and sure enough, he was back in sinus rhythm. We then performed treadmill test which overall was low risk, 10 minutes, occasional PVCs/couplets but no ST segment changes indicative of ischemia. 12 METs of activity. His wife, Connor Duffy was present.   We discussed at length the implications of atrial fibrillation. CHADS-VAS is 1. Age only. Aspirin he is taking. We discussed the risks of stroke with atrial fibrillation. We discussed the possibilities of antiarrhythmic therapy and even touched on ablation therapy which I stated would be only if he were to remain highly symptomatic especially with antiarrhythmic medications. We decided to initiate low-dose diltiazem 120 mg.   08/27/13-told me about his amazing avenger hiking across [place] 250 miles. 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 No strokelike symptoms. No chest pain.  03/14/15 - feeling atypical chest pain-mostly a back heaviness when walking occasionally, also increased dyspnea on exertion. His brother recently had myocardial infarction. He is concerned about the possibility of coronary artery disease. Dyspnea most often with elevation hikes. Feels 80% of prior stamina.    03/26/15-he comes back today to discuss his nuclear stress test. Despite overall low risk, he still feels as though something is wrong. His dyspnea is quite significant he states. Has decreased exercise tolerance is noteworthy.  02/21/18 - Back pain. Hiking. 6 miles hot. Lightheaded. Stopped. Siliar. Connor Duffy wife. 30 out dizzy. Felt heart was irreg. 4 days a week gym. Pradaxa.\ NUC stress 02/2018 reassuring.  No evidence of ischemia.  [facility] DVT - On pradaxa.  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  Canceled trip to [place] because of this.  He is also on chemotherapy for prostate cancer as well as Lupron hormonal therapy.  Wt Readings from Last 3 Encounters:  02/21/18 196 lb (88.9 kg)  05/24/17 194 lb (88 kg)  05/18/17 194 lb (88 kg)     Past Medical History:  Diagnosis Date  . Arthritis   . Atrial fibrillation ([place])   . Cancer ([place])    PROSTATE  . Dysrhythmia   . Spinal stenosis     Past Surgical History:  Procedure Laterality Date  . BACK SURGERY     spinal stenosis surgery and herniated disc surgery  . CARDIAC CATHETERIZATION N/A 04/08/2015   Procedure: Left Heart Cath and Coronary Angiography;  Surgeon: Jerline Pain,  MD;  Location: [facility];  Service: Cardiovascular;  Laterality: N/A;  . HERNIA REPAIR     inguinal bilateral  . JOINT REPLACEMENT     Left total knee Dr. Alvan Dame 05/24/17  . KNEE ARTHROSCOPY  LEFT   Also right  . PENILE PROSTHESIS IMPLANT    . PROSTATECTOMY    . TOTAL HIP ARTHROPLASTY  07/08/2011   Procedure: TOTAL HIP ARTHROPLASTY ANTERIOR APPROACH;  Surgeon: Mcarthur Rossetti;  Location: [facility];  Service: Orthopedics;  Laterality: Right;  Right Total Hip Arthroplasty, Direct Anterior Approach   (c-arm)  . TOTAL KNEE ARTHROPLASTY Left 05/24/2017   Procedure: LEFT TOTAL KNEE ARTHROPLASTY;  Surgeon: Paralee Cancel, MD;  Location: [facility];  Service: Orthopedics;  Laterality: Left;  Adductor Block    Current Outpatient Medications  Medication Sig Dispense Refill  . abiraterone acetate (ZYTIGA) 250 MG tablet Take 1,000 mg daily by mouth. Take on an empty stomach 1 hour before or 2 hours after a meal    . dabigatran (PRADAXA) 150 MG CAPS capsule Take 150 mg 2 (two) times daily by mouth.    . diltiazem (CARDIZEM LA) 120 MG 24 hr tablet Take 120 mg by mouth daily.    Marland Kitchen gabapentin (NEURONTIN) 300 MG capsule Take 300 mg at bedtime by mouth.    . oxyCODONE-acetaminophen (PERCOCET/ROXICET) 5-325 MG tablet Take 5-325 tablets by mouth daily.    . polyethylene glycol (MIRALAX / GLYCOLAX) packet Take 17 g by mouth 2 (two) times daily. 14 each 0  . predniSONE (DELTASONE) 5 MG tablet Take 5 mg daily with breakfast by mouth.    . zolpidem (AMBIEN) 5 MG tablet Take 5 mg at bedtime by mouth.     No current facility-administered medications for this visit.     Allergies:   No Known Allergies  Social History:  The patient  reports that he has never smoked. He has never used smokeless tobacco. He reports that he does not drink alcohol or use drugs.   ROS:  Please see the history of present illness.  All others neg PHYSICAL EXAM: VS:  BP 118/70   Pulse 68   Ht 5\' 11"  (1.803 m)   Wt 196 lb (88.9 kg)   BMI 27.34 kg/m  GEN: Well nourished, well developed, in no acute distress  HEENT: normal  Neck: no JVD, carotid bruits, or masses Cardiac: RRR; no murmurs, rubs, or gallops,no edema  Respiratory:  clear to auscultation bilaterally, normal work of breathing GI: soft, nontender, nondistended, + BS MS: no deformity or atrophy  Skin: warm and dry, no rash Neuro:  Alert and  Oriented x 3, Strength and sensation are intact Psych: euthymic mood, full affect   EKG:  Today: 02/21/2018-sinus rhythm first-degree AV block 68 bpm 234 ms.  Personally viewed 03/14/15-sinus rhythm, borderline first-degree AV block, PVC noted personally viewed-NSR 68, PVC, IRBBB, NSSTW changes.     Echocardiogram 01/26/13 -Normal ejection fraction, mild left atrial enlargement.   NUC stress 03/24/15   Nuclear stress EF: 56%.   Blood pressure demonstrated a blunted response to exercise.   There was no ST segment deviation noted during stress.   This is a low risk study.   The left ventricular ejection fraction is normal (55-65%).  Low risk stress nuclear study with a small, moderate intensity, partially reversible anterior defect consistent with soft tissue attenuation and mild anterior ischemia; EF 56 with normal wall motion.  Overall reassuring. Lets continue to monitor his symptoms.  [facility].   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  Normal left ventricular ejection fraction, EF 50-55%.     Overall non-obstructive coronary disease. He is feeling better now that he is taking aspirin. We will continue to monitor. If necessary, we will obtain echo. Candee Furbish, MD  Nuclear stress test August 2019- low risk, no ischemia at [facility]:  1. Paroxysmal atrial fibrillation -continue with Pradaxa.  He is taking this also because of prior DVTs.  Continue with diltiazem.  He felt an irregular heart rate when he was feeling faint on the hike in the heat.  He has been wearing a monitor for 30 days with the [facility].  He just turned this monitor in.  Unfortunately, he has had back pain and has not been able to exercise like he usually does.  He is disappointed in the way he feels.  He  usually was quite active and now he is feeling run down.  Could be a combination of things, perhaps low blood pressure, perhaps atrial fibrillation, perhaps chemotherapy related.  Continue with hydration.  Back pain improvement.  Once his back is improved, I would consider placing him on a treadmill if he is able to see if we are able to duplicate any of the symptoms.  2. 67-month follow-up  Signed, Candee Furbish, MD [facility]  02/21/2018 4:33 PM

## 2018-02-28 ENCOUNTER — Telehealth: Payer: Self-pay | Admitting: *Deleted

## 2018-02-28 NOTE — Telephone Encounter (Signed)
-----   Message -----  From: Glory Buff  Sent: 02/28/2018  1:47 PM EDT  To: [facility] Triage  Subject: Non-Urgent Medical Question             Seem to be having very frequent AFIB events where I feel faint...still no results from heart monitor   MyChart message  Reviewed the above information with Dr Marlou Porch who gave verbal orders for pt to be referred to the [facility] clinic to discuss options and possibly Tikosyn.  Left message for patient that I will call again tomorrow.

## 2018-03-01 NOTE — Telephone Encounter (Signed)
Left message for pt to c/b to discuss recommendations (at fib clinic) and to see if he has gotten results of his monitor from the [place] yet.

## 2018-03-02 ENCOUNTER — Telehealth: Payer: Self-pay | Admitting: Cardiology

## 2018-03-02 NOTE — Telephone Encounter (Signed)
Patient wanted to let Dr. Marlou Porch know that the [facility] just got monitor yesterday and 10 days to get results. Patient given fax number, so they can fax to our office. Made patient an appointment with A. FIB clinic per Dr. Marlou Porch' verbal orders. Patient will see A. FIB clinic 03/16/18. Will forward to Dr. Marlou Porch and his nurse.

## 2018-03-02 NOTE — Telephone Encounter (Signed)
New Message:    Pt is returning a call, pt states to please call after 10:30

## 2018-03-07 NOTE — Telephone Encounter (Signed)
Pt has been scheduled in the At [facility] for 9/12.

## 2018-03-16 ENCOUNTER — Ambulatory Visit (HOSPITAL_COMMUNITY): Payer: Medicare Other | Admitting: Nurse Practitioner

## 2018-03-16 ENCOUNTER — Other Ambulatory Visit (HOSPITAL_COMMUNITY): Payer: Self-pay | Admitting: Hematology and Oncology

## 2018-03-16 DIAGNOSIS — C61 Malignant neoplasm of prostate: Secondary | ICD-10-CM

## 2018-03-16 DIAGNOSIS — R599 Enlarged lymph nodes, unspecified: Secondary | ICD-10-CM

## 2018-03-17 NOTE — Telephone Encounter (Signed)
Excellent.  Please let us know if we can be of further assistance. Candee Furbish, MD

## 2018-03-20 ENCOUNTER — Other Ambulatory Visit (HOSPITAL_COMMUNITY): Payer: Self-pay | Admitting: Hematology and Oncology

## 2018-03-20 ENCOUNTER — Inpatient Hospital Stay
Admission: RE | Admit: 2018-03-20 | Discharge: 2018-03-20 | Disposition: A | Discharge status: Home / Self Care | Payer: Self-pay | Source: Ambulatory Visit | Attending: Hematology and Oncology | Admitting: Hematology and Oncology

## 2018-03-20 DIAGNOSIS — C801 Malignant (primary) neoplasm, unspecified: Secondary | ICD-10-CM

## 2018-04-26 DIAGNOSIS — C61 Malignant neoplasm of prostate: Secondary | ICD-10-CM | POA: Diagnosis not present

## 2018-04-26 DIAGNOSIS — R590 Localized enlarged lymph nodes: Secondary | ICD-10-CM | POA: Diagnosis not present

## 2018-05-29 ENCOUNTER — Other Ambulatory Visit: Payer: Self-pay

## 2018-05-29 ENCOUNTER — Inpatient Hospital Stay (HOSPITAL_COMMUNITY)
Admission: EM | Admit: 2018-05-29 | Discharge: 2018-05-31 | DRG: 393 | Disposition: A | Discharge status: Home / Self Care | Payer: No Typology Code available for payment source | Attending: Oncology | Admitting: Oncology

## 2018-05-29 DIAGNOSIS — K627 Radiation proctitis: Secondary | ICD-10-CM | POA: Diagnosis not present

## 2018-05-29 DIAGNOSIS — Z86010 Personal history of colonic polyps: Secondary | ICD-10-CM

## 2018-05-29 DIAGNOSIS — Z833 Family history of diabetes mellitus: Secondary | ICD-10-CM

## 2018-05-29 DIAGNOSIS — Z9079 Acquired absence of other genital organ(s): Secondary | ICD-10-CM

## 2018-05-29 DIAGNOSIS — I4510 Unspecified right bundle-branch block: Secondary | ICD-10-CM | POA: Diagnosis present

## 2018-05-29 DIAGNOSIS — Z923 Personal history of irradiation: Secondary | ICD-10-CM

## 2018-05-29 DIAGNOSIS — I440 Atrioventricular block, first degree: Secondary | ICD-10-CM | POA: Diagnosis not present

## 2018-05-29 DIAGNOSIS — Z960 Presence of urogenital implants: Secondary | ICD-10-CM | POA: Diagnosis present

## 2018-05-29 DIAGNOSIS — T45515A Adverse effect of anticoagulants, initial encounter: Secondary | ICD-10-CM | POA: Diagnosis present

## 2018-05-29 DIAGNOSIS — K648 Other hemorrhoids: Secondary | ICD-10-CM | POA: Diagnosis present

## 2018-05-29 DIAGNOSIS — Z8719 Personal history of other diseases of the digestive system: Secondary | ICD-10-CM | POA: Diagnosis not present

## 2018-05-29 DIAGNOSIS — I480 Paroxysmal atrial fibrillation: Secondary | ICD-10-CM | POA: Diagnosis present

## 2018-05-29 DIAGNOSIS — Z803 Family history of malignant neoplasm of breast: Secondary | ICD-10-CM

## 2018-05-29 DIAGNOSIS — Z79899 Other long term (current) drug therapy: Secondary | ICD-10-CM | POA: Diagnosis not present

## 2018-05-29 DIAGNOSIS — K5790 Diverticulosis of intestine, part unspecified, without perforation or abscess without bleeding: Secondary | ICD-10-CM | POA: Diagnosis not present

## 2018-05-29 DIAGNOSIS — Y842 Radiological procedure and radiotherapy as the cause of abnormal reaction of the patient, or of later complication, without mention of misadventure at the time of the procedure: Secondary | ICD-10-CM | POA: Diagnosis present

## 2018-05-29 DIAGNOSIS — C61 Malignant neoplasm of prostate: Secondary | ICD-10-CM | POA: Diagnosis present

## 2018-05-29 DIAGNOSIS — K922 Gastrointestinal hemorrhage, unspecified: Secondary | ICD-10-CM

## 2018-05-29 DIAGNOSIS — R1310 Dysphagia, unspecified: Secondary | ICD-10-CM | POA: Diagnosis present

## 2018-05-29 DIAGNOSIS — Z791 Long term (current) use of non-steroidal anti-inflammatories (NSAID): Secondary | ICD-10-CM

## 2018-05-29 DIAGNOSIS — Z800 Family history of malignant neoplasm of digestive organs: Secondary | ICD-10-CM | POA: Diagnosis not present

## 2018-05-29 DIAGNOSIS — Z7901 Long term (current) use of anticoagulants: Secondary | ICD-10-CM | POA: Diagnosis not present

## 2018-05-29 DIAGNOSIS — Z8546 Personal history of malignant neoplasm of prostate: Secondary | ICD-10-CM | POA: Diagnosis not present

## 2018-05-29 DIAGNOSIS — I10 Essential (primary) hypertension: Secondary | ICD-10-CM | POA: Diagnosis present

## 2018-05-29 DIAGNOSIS — Z86718 Personal history of other venous thrombosis and embolism: Secondary | ICD-10-CM

## 2018-05-29 DIAGNOSIS — K644 Residual hemorrhoidal skin tags: Secondary | ICD-10-CM | POA: Diagnosis not present

## 2018-05-29 DIAGNOSIS — Z7952 Long term (current) use of systemic steroids: Secondary | ICD-10-CM

## 2018-05-29 DIAGNOSIS — K449 Diaphragmatic hernia without obstruction or gangrene: Secondary | ICD-10-CM | POA: Diagnosis not present

## 2018-05-29 DIAGNOSIS — Z96652 Presence of left artificial knee joint: Secondary | ICD-10-CM | POA: Diagnosis present

## 2018-05-29 DIAGNOSIS — K5731 Diverticulosis of large intestine without perforation or abscess with bleeding: Secondary | ICD-10-CM | POA: Diagnosis present

## 2018-05-29 DIAGNOSIS — K625 Hemorrhage of anus and rectum: Secondary | ICD-10-CM | POA: Diagnosis not present

## 2018-05-29 DIAGNOSIS — Z8042 Family history of malignant neoplasm of prostate: Secondary | ICD-10-CM

## 2018-05-29 DIAGNOSIS — T17308A Unspecified foreign body in larynx causing other injury, initial encounter: Secondary | ICD-10-CM | POA: Diagnosis not present

## 2018-05-29 DIAGNOSIS — Z96641 Presence of right artificial hip joint: Secondary | ICD-10-CM | POA: Diagnosis present

## 2018-05-29 DIAGNOSIS — K921 Melena: Secondary | ICD-10-CM | POA: Diagnosis not present

## 2018-05-29 LAB — COMPREHENSIVE METABOLIC PANEL
ALT: 16 U/L (ref 0–44)
AST: 23 U/L (ref 15–41)
Albumin: 3.7 g/dL (ref 3.5–5.0)
Alkaline Phosphatase: 67 U/L (ref 38–126)
Anion gap: 6 (ref 5–15)
BUN: 24 mg/dL — AB (ref 8–23)
CHLORIDE: 109 mmol/L (ref 98–111)
CO2: 24 mmol/L (ref 22–32)
Calcium: 9 mg/dL (ref 8.9–10.3)
Creatinine, Ser: 1.06 mg/dL (ref 0.61–1.24)
GFR calc Af Amer: >60 mL/min (ref >60)
Glucose, Bld: 97 mg/dL (ref 70–99)
Potassium: 3.7 mmol/L (ref 3.5–5.1)
SODIUM: 139 mmol/L (ref 135–145)
Total Bilirubin: 0.8 mg/dL (ref 0.3–1.2)
Total Protein: 6.2 g/dL — ABNORMAL LOW (ref 6.5–8.1)

## 2018-05-29 LAB — POC OCCULT BLOOD, ED: Fecal Occult Bld: POSITIVE — AB

## 2018-05-29 LAB — ABO/RH: ABO/RH(D): O POS

## 2018-05-29 LAB — CBC
HCT: 37.5 % — ABNORMAL LOW (ref 39.0–52.0)
Hemoglobin: 11.8 g/dL — ABNORMAL LOW (ref 13.0–17.0)
MCH: 28.8 pg (ref 26.0–34.0)
MCHC: 31.5 g/dL (ref 30.0–36.0)
MCV: 91.5 fL (ref 80.0–100.0)
NRBC: 0 % (ref 0.0–0.2)
PLATELETS: 289 10*3/uL (ref 150–400)
RBC: 4.1 MIL/uL — AB (ref 4.22–5.81)
RDW: 13.2 % (ref 11.5–15.5)
WBC: 7.1 10*3/uL (ref 4.0–10.5)

## 2018-05-29 LAB — TYPE AND SCREEN
ABO/RH(D): O POS
Antibody Screen: NEGATIVE

## 2018-05-29 LAB — PSA: Prostatic Specific Antigen: 0.11 ng/mL (ref 0.00–4.00)

## 2018-05-29 MED ORDER — PREDNISONE 5 MG PO TABS
5.0000 mg | ORAL_TABLET | Freq: Every day | ORAL | Status: DC
  Administered 2018-05-30 – 2018-05-31 (×2): 5 mg via ORAL
  Filled 2018-05-29 (×3): qty 1

## 2018-05-29 MED ORDER — ACETAMINOPHEN 325 MG PO TABS: 650.0000 mg | ORAL_TABLET | Freq: Four times a day (QID) | ORAL | Status: DC | PRN

## 2018-05-29 MED ORDER — ZOLPIDEM TARTRATE 5 MG PO TABS
5.0000 mg | ORAL_TABLET | Freq: Every day | ORAL | Status: DC
  Administered 2018-05-29 – 2018-05-30 (×2): 5 mg via ORAL
  Filled 2018-05-29 (×2): qty 1

## 2018-05-29 MED ORDER — DILTIAZEM HCL ER COATED BEADS 240 MG PO CP24
240.0000 mg | ORAL_CAPSULE | Freq: Every day | ORAL | Status: DC
  Administered 2018-05-30 – 2018-05-31 (×2): 240 mg via ORAL
  Filled 2018-05-29 (×2): qty 1

## 2018-05-29 MED ORDER — SODIUM CHLORIDE 0.9% FLUSH
3.0000 mL | Freq: Two times a day (BID) | INTRAVENOUS | Status: DC
  Administered 2018-05-30 – 2018-05-31 (×2): 3 mL via INTRAVENOUS

## 2018-05-29 MED ORDER — GABAPENTIN 300 MG PO CAPS
300.0000 mg | ORAL_CAPSULE | Freq: Every day | ORAL | Status: DC
  Administered 2018-05-29 – 2018-05-30 (×2): 300 mg via ORAL
  Filled 2018-05-29 (×2): qty 1

## 2018-05-29 MED ORDER — ACETAMINOPHEN 650 MG RE SUPP: 650.0000 mg | Freq: Four times a day (QID) | RECTAL | Status: DC | PRN

## 2018-05-29 MED ORDER — ABIRATERONE ACETATE 250 MG PO TABS
1000.0000 mg | ORAL_TABLET | Freq: Every day | ORAL | Status: DC
  Administered 2018-05-30 – 2018-05-31 (×2): 1000 mg via ORAL
  Filled 2018-05-29 (×3): qty 4

## 2018-05-29 MED ORDER — POLYVINYL ALCOHOL 1.4 % OP SOLN
1.0000 [drp] | Freq: Two times a day (BID) | OPHTHALMIC | Status: DC | PRN
  Filled 2018-05-29: qty 15

## 2018-05-29 MED ORDER — DILTIAZEM HCL ER COATED BEADS 240 MG PO CP24: 240.0000 mg | ORAL_CAPSULE | Freq: Every day | ORAL | Status: DC

## 2018-05-29 MED ORDER — ABIRATERONE ACETATE 250 MG PO TABS: 1000.0000 mg | ORAL_TABLET | Freq: Every day | ORAL | Status: DC

## 2018-05-29 NOTE — ED Notes (Signed)
Patient ambulated to wheelchair at departure from department; Pt is A&Ox 4 and confirms he is independent at home; Patient's wife has left for the night-Monique,RN

## 2018-05-29 NOTE — H&P (Addendum)
Date: 05/29/2018               Patient Name:  Connor Duffy MRN: [id number]  DOB: 1941-01-24 Age / Sex: 77 y.o., male   PCP: System, Pcp Not In         Medical Service: Internal Medicine Teaching Service         Attending Physician: Dr. Annia Belt, MD    First Contact: Dr. Sharon Seller Pager: [phone]  Second Contact: Dr. Frederico Hamman Pager: [phone]       After Hours (After 5p/  First Contact Pager: [phone]  weekends / holidays): Second Contact Pager: [phone]   Chief Complaint: bright red blood in stool   History of Present Illness:  Connor Duffy is a 77 yo male with PMH hypertension, atrial fibrillation, prostate cancer s/p prostatectomy 2002, mesenteric lymphadenopathy seen diverticulosis, and DVT presenting today after seeing blood in his stool starting about eight days ago, described as initially BRB and now darker in color for the past five days. He has not had any significant changes in BM, no pain with bowel movements, no nausea, vomiting, changes in appetite. He does endorse increased pressure like food is getting stuck in his throat when he eats certain foods, especially those that include bread, which started about one month ago. No history of GERD and he does not have any burning chest or abdominal pain.  Denies dizziness, numbness or tingling, chest pain, orshortness of breath. He has a history of atrial fibrillation and recently had his diltiazem increased from 120 mg to 240 mg two months ago. Since then he has noticed increased swelling in his legs which has not happened before. He also had a colonoscopy about six months ago which showed diverticulosis and one benign polyp.  He has a history of prostate cancer with prostatectomy in 2002 and now follows with Dr. Adair Laundry with general surgery after screening CT showed 1.7cm mesenteric node, recent PET showed mild metabolic uptake and possible malignancy. No recent illness, recent fever, or weight loss.   Meds:  Current Meds    Medication Sig  . abiraterone acetate (ZYTIGA) 250 MG tablet Take 1,000 mg daily by mouth. Take on an empty stomach 1 hour before or 2 hours after a meal  . carboxymethylcellulose (REFRESH PLUS) 0.5 % SOLN Place 1 drop into both eyes 2 (two) times daily as needed (dry eyes).  . dabigatran (PRADAXA) 150 MG CAPS capsule Take 150 mg 2 (two) times daily by mouth.  . diclofenac sodium (VOLTAREN) 1 % GEL Apply 2 g topically 4 (four) times daily as needed (knee pain).  Marland Kitchen diltiazem (CARDIZEM CD) 240 MG 24 hr capsule Take 240 mg by mouth daily.   Marland Kitchen gabapentin (NEURONTIN) 300 MG capsule Take 300 mg at bedtime by mouth.  . predniSONE (DELTASONE) 5 MG tablet Take 5 mg daily with breakfast by mouth.  . zolpidem (AMBIEN) 5 MG tablet Take 5 mg at bedtime by mouth.    Allergies: Allergies as of 05/29/2018  . (No Known Allergies)   Past Medical History:  Diagnosis Date  . Arthritis   . Atrial fibrillation ([place])   . Cancer ([place])    PROSTATE  . Dysrhythmia   . Spinal stenosis     Family History:  Father: colon cancer Brother: prostate cancer Sister: breast cancer Mother: Diabetes  Social History:  Retired Freight forwarder for [employer], lives in [place] with his wife Exercises 4x/week  Review of Systems: A complete ROS was negative except as per  HPI.   Physical Exam: Blood pressure 138/72, pulse 71, temperature 98.1 F (36.7 C), temperature source Oral, resp. rate 20, height 5\' 11"  (1.803 m), weight 90.7 kg, SpO2 97 %.  Constitution: NAD, reading in bed HEENT: no scleral icterus, EOM intact Cardio: RRR, no m/r/g Respiratory: CTAB, no w/r/r Abdominal: TTP LLQ, +BS, soft, non-distended, no rebound tenderness GU: unable to perform rectal exam due to patient in hallway Neuro: A&O, cooperative, normal affect Skin: c/d/i, 2+ pitting edema  FOBT: positive  Assessment & Plan by Problem: Active Problems:   Lower GI bleed  77yo male with PMH hypertension, atrial fibrillation, prostate cancer  s/p prostatectomy 2002, mesenteric lymphadenopathy seen diverticulosis, and DVT presenting today after seeing blood in his stool starting about eight days ago, described as initially BRB.   BRB Per Rectum FOBT positive. Blood typed and screened by ED. Hemodynamically stable, hemoglobin stable with mild anemia at 11.2. He takes pradaxa for previous DVT which he has been on for two years, and his bleed is possibly secondary to his medication in addition to diverticulosis seen on colonoscopy. He is TTP in the LLQ and has no pain with defecation. Colonoscopy had no other significant findings. There is a family history of colon cancer with his father. In addition, he has recent increased size in mesenteric lymph node at 1.7cm with positive uptake on PET. Currently no symptoms significant for Ca such as changes in BM, weight loss, night sweats, changes in appetite. Less likely is PUD with recent symptoms of food becoming stuck in the lower sternum although no abdominal pain or burning or symptoms of GERD. History of prostate cancer with treatment of radiation, cannot rule out radiation proctitis as well.   - am CBC, BMP - hold pradaxa - placed on SCDs - GI consulted, appreciate their recommendations   Paroxysmal Atrial Fibrillation LE Edema  Lower extremity swelling which patient noted shortly after increase in dilt from 120 to 240 mg. +2 pitting edema today on exam. Last echo 02/2017 with nml EF and grade 1 diastolic dysfunction  - hold pradaxa - cont. Dilt 240 mg - EKG - daily weights, strict I/O's  History of Prostate Cancer s/p Prostatectomy   - cont. Prednisone 5 mg qd and zytiga 1,000 mg - PSA  Diet: heart healthy VTE: SCDs IVF: none Code: Full   Dispo: Admit patient to Inpatient with expected length of stay greater than 2 midnights.  SignedMarty Heck, DO 05/29/2018, 6:49 PM  Pager: [phone]

## 2018-05-29 NOTE — ED Triage Notes (Signed)
Pt reports bright red blood in his stool x1 week. Pt states that he occasionally has some left lower quadrant abdominal discomfort as well.

## 2018-05-29 NOTE — ED Provider Notes (Signed)
[facility] EMERGENCY DEPARTMENT Provider Note   CSN: [id number] Arrival date & time: 05/29/18  1124     History   Chief Complaint Chief Complaint  Patient presents with  . Rectal Bleeding    HPI Bricyn Labrada is a 77 y.o. male.  HPI Patient is a 77 year old male on chronic anticoagulation secondary to history of paroxysmal A. fib as well as a history of bilateral lower extremity DVT.  He presents emergency department with approximately 1 week of bright red blood in his stool reports this is worse over the past 48 hours with maroon-colored stools.  He does have a history of diverticulosis but denies abdominal pain at this time.  Reports compliance with his medication including his anticoagulants.  Is been on anticoagulants for some period of time.  Denies fevers and chills.  No dysuria urinary frequency.   Past Medical History:  Diagnosis Date  . Arthritis   . Atrial fibrillation ([place])   . Cancer ([place])    PROSTATE  . Dysrhythmia   . Spinal stenosis     Patient Active Problem List   Diagnosis Date Noted  . Overweight (BMI 25.0-29.9) 05/25/2017  . S/P left TKA 05/24/2017  . 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[place])   . Dyspnea 03/14/2015  . Atypical chest pain 03/14/2015  . PAF (paroxysmal atrial fibrillation) ([place]) 03/14/2015  . Arthritis   . Cancer ([place])   . Spinal stenosis   . Atrial fibrillation ([place])   . Degenerative arthritis of hip 07/08/2011    Past Surgical History:  Procedure Laterality Date  . BACK SURGERY     spinal stenosis surgery and herniated disc surgery  . CARDIAC CATHETERIZATION N/A 04/08/2015   Procedure: Left Heart Cath and Coronary Angiography;  Surgeon: Jerline Pain, MD;  Location: [facility];  Service: Cardiovascular;  Laterality: N/A;  . HERNIA REPAIR     inguinal bilateral  . JOINT REPLACEMENT     Left total knee Dr. Alvan Dame 05/24/17  . KNEE ARTHROSCOPY  LEFT   Also right  . PENILE PROSTHESIS IMPLANT    . PROSTATECTOMY    . TOTAL  HIP ARTHROPLASTY  07/08/2011   Procedure: TOTAL HIP ARTHROPLASTY ANTERIOR APPROACH;  Surgeon: Mcarthur Rossetti;  Location: [facility];  Service: Orthopedics;  Laterality: Right;  Right Total Hip Arthroplasty, Direct Anterior Approach   (c-arm)  . TOTAL KNEE ARTHROPLASTY Left 05/24/2017   Procedure: LEFT TOTAL KNEE ARTHROPLASTY;  Surgeon: Paralee Cancel, MD;  Location: [facility];  Service: Orthopedics;  Laterality: Left;  Adductor Block        Home Medications    Prior to Admission medications   Medication Sig Start Date End Date Taking? Authorizing Provider  abiraterone acetate (ZYTIGA) 250 MG tablet Take 1,000 mg daily by mouth. Take on an empty stomach 1 hour before or 2 hours after a meal   Yes [provider]  carboxymethylcellulose (REFRESH PLUS) 0.5 % SOLN Place 1 drop into both eyes 2 (two) times daily as needed (dry eyes).   Yes [provider]  dabigatran (PRADAXA) 150 MG CAPS capsule Take 150 mg 2 (two) times daily by mouth.   Yes [provider]  diclofenac sodium (VOLTAREN) 1 % GEL Apply 2 g topically 4 (four) times daily as needed (knee pain).   Yes [provider]  diltiazem (CARDIZEM CD) 240 MG 24 hr capsule Take 240 mg by mouth daily.    Yes [provider]  gabapentin (NEURONTIN) 300 MG capsule Take 300  mg at bedtime by mouth.   Yes [provider]  predniSONE (DELTASONE) 5 MG tablet Take 5 mg daily with breakfast by mouth.   Yes [provider]  zolpidem (AMBIEN) 5 MG tablet Take 5 mg at bedtime by mouth.   Yes [provider]  polyethylene glycol (MIRALAX / GLYCOLAX) packet Take 17 g by mouth 2 (two) times daily. Patient not taking: Reported on 05/29/2018 05/25/17   Danae Orleans, PA-C    Family History Family History  Problem Relation Age of Onset  . Cancer - Colon Father     Social History Social History   Tobacco Use  . Smoking status: Never Smoker  . Smokeless tobacco: Never Used    Substance Use Topics  . Alcohol use: No    Frequency: Never  . Drug use: No     Allergies   Patient has no known allergies.   Review of Systems Review of Systems  All other systems reviewed and are negative.    Physical Exam Updated Vital Signs BP 134/83   Pulse 64   Temp 98.1 F (36.7 C) (Oral)   Resp 20   Ht 5\' 11"  (1.803 m)   Wt 90.7 kg   SpO2 97%   BMI 27.89 kg/m   Physical Exam  Constitutional: He is oriented to person, place, and time. He appears well-developed and well-nourished.  HENT:  Head: Normocephalic and atraumatic.  Eyes: EOM are normal.  Neck: Normal range of motion.  Cardiovascular: Normal rate, regular rhythm, normal heart sounds and intact distal pulses.  Pulmonary/Chest: Effort normal and breath sounds normal. No respiratory distress.  Abdominal: Soft. He exhibits no distension. There is no tenderness.  Musculoskeletal: Normal range of motion.  Neurological: He is alert and oriented to person, place, and time.  Skin: Skin is warm and dry.  Psychiatric: He has a normal mood and affect. Judgment normal.  Nursing note and vitals reviewed.    ED Treatments / Results  Labs (all labs ordered are listed, but only abnormal results are displayed) Labs Reviewed  CBC - Abnormal; Notable for the following components:      Result Value   RBC 4.10 (*)    Hemoglobin 11.8 (*)    HCT 37.5 (*)    All other components within normal limits  COMPREHENSIVE METABOLIC PANEL - Abnormal; Notable for the following components:   BUN 24 (*)    Total Protein 6.2 (*)    All other components within normal limits  POC OCCULT BLOOD, ED - Abnormal; Notable for the following components:   Fecal Occult Bld POSITIVE (*)    All other components within normal limits  OCCULT BLOOD X 1 CARD TO LAB, STOOL  TYPE AND SCREEN  ABO/RH    EKG None  Radiology No results found.  Procedures Procedures (including critical care time)  Medications Ordered in  ED Medications - No data to display   Initial Impression / Assessment and Plan / ED Course  I have reviewed the triage vital signs and the nursing notes.  Pertinent labs & imaging results that were available during my care of the patient were reviewed by me and considered in my medical decision making (see chart for details).     Hematochezia in the emergency department.  Patient may need admission the hospital for serial CBCs.  I am concerned about his ability to remain on anticoagulation secondary to lower GI bleed.  He however is at high risk of developing recurrent DVT and pulmonary  embolism given his history of hypercoagulability.  He also is being worked up for possible active cancer which would also heighten his wrist.  He may be a candidate for IVC filter.  Patient will be admitted to hospital for IVC filter consideration and serial CBCs.  Final Clinical Impressions(s) / ED Diagnoses   Final diagnoses:  Acute lower GI bleeding  Chronic anticoagulation  History of deep vein thrombosis    ED Discharge Orders    None       Jola Schmidt, MD 05/29/18 1536

## 2018-05-29 NOTE — Progress Notes (Signed)
Medicine attending admission note: I personally interviewed and examined this patient and reviewed all pertinent clinical, laboratory, and imaging data, and I attest to the accuracy of the evaluation and management plan as recorded in the history and physical by resident physician Dr Molli Hazard.  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  He has not been straining at stools.  He variably sees blood after he has passed a stool.  He denies melena.  Some stools are mixed with blood and are a more maroon color.  He has no history of peptic ulcer disease or reflux.  He denies alcohol use.  He is on chronic anticoagulation with Pradaxa for both stroke prevention in view of atrial fibrillation and with a history of bilateral lower extremity DVTs. He states he had a colonoscopy about 6 months ago by Dr. Watt Climes.  A small polyp was removed.  He was told that he had diverticulosis.  He has never had diverticulitis. Over the last 6 months he has had intermittent dysphagia for solid foods but not liquids. Of note is a history of prostate cancer diagnosed in 2002 initially treated with prostatectomy.  About 2 years later he had a local recurrence treated with radiation.  He has had subsequent rise in his PSA and is currently on a combination of Zytiga, low-dose prednisone, and biannual leuprolide injections.  Recent PET scan with low uptake in a mesenteric lymph node of questionable significance and a needle biopsy was being considered.  Patient states that he did have a rectal exam in the emergency department but I do not see documentation of this.  Stool was positive for occult blood on a guaiac card.  Current exam: Blood pressure 138/72, pulse 71, temperature 98.1 F (36.7 C), temperature source Oral, resp. rate 20, height 5\' 11"  (1.803 m), weight 200 lb (90.7 kg), SpO2 97 %. Well-nourished Caucasian man in no  distress. On my exam his abdomen is soft and nontender without any mass or organomegaly.  No inguinal lymphadenopathy.  Rectal exam not repeated. Chronic bilateral symmetric swelling of his calves.  Fine petechial rash.  Dorsalis pedis pulses 2+ symmetric.  Pertinent lab: Hemoglobin 11.8, platelet count 289,000 compared with hemoglobin 11.3 and platelet count 202,000 recorded 1 year ago on May 25, 2017. BUN 24, creatinine 1.06.  Liver functions normal.  Impression: Intermittent painless hematochezia.  He is hemodynamically stable and his hemoglobin is the same as it was 1 year ago so it does not appear that he has lost much blood. Differential in this man to explain a possible lower GI bleed would include diverticulitis, hemorrhoidal bleeding, or radiation proctitis.  Radiation effects may not show up for many years after the treatment. He does have upper GI symptoms with dysphagia for solid foods so upper GI pathology is not excluded.  I agree with holding anticoagulation pending GI consultation. There is no indication at this time for a vena cava filter as bleeding appears to be low-grade and he is hemodynamically stable.  I would use mechanical compression devices on his lower extremities for now.

## 2018-05-30 ENCOUNTER — Encounter (HOSPITAL_COMMUNITY): Payer: Self-pay | Admitting: *Deleted

## 2018-05-30 ENCOUNTER — Encounter (HOSPITAL_COMMUNITY)
Admission: EM | Disposition: A | Discharge status: Home / Self Care | Payer: Self-pay | Source: Home / Self Care | Attending: Oncology

## 2018-05-30 ENCOUNTER — Inpatient Hospital Stay (HOSPITAL_COMMUNITY): Payer: No Typology Code available for payment source

## 2018-05-30 DIAGNOSIS — Z79899 Other long term (current) drug therapy: Secondary | ICD-10-CM

## 2018-05-30 DIAGNOSIS — Z8546 Personal history of malignant neoplasm of prostate: Secondary | ICD-10-CM

## 2018-05-30 DIAGNOSIS — Z9079 Acquired absence of other genital organ(s): Secondary | ICD-10-CM

## 2018-05-30 DIAGNOSIS — Z8719 Personal history of other diseases of the digestive system: Secondary | ICD-10-CM

## 2018-05-30 DIAGNOSIS — R1310 Dysphagia, unspecified: Secondary | ICD-10-CM

## 2018-05-30 DIAGNOSIS — I480 Paroxysmal atrial fibrillation: Secondary | ICD-10-CM

## 2018-05-30 DIAGNOSIS — I4510 Unspecified right bundle-branch block: Secondary | ICD-10-CM

## 2018-05-30 DIAGNOSIS — I440 Atrioventricular block, first degree: Secondary | ICD-10-CM

## 2018-05-30 DIAGNOSIS — Z86718 Personal history of other venous thrombosis and embolism: Secondary | ICD-10-CM

## 2018-05-30 DIAGNOSIS — K5790 Diverticulosis of intestine, part unspecified, without perforation or abscess without bleeding: Secondary | ICD-10-CM

## 2018-05-30 DIAGNOSIS — Z7901 Long term (current) use of anticoagulants: Secondary | ICD-10-CM

## 2018-05-30 DIAGNOSIS — K922 Gastrointestinal hemorrhage, unspecified: Secondary | ICD-10-CM

## 2018-05-30 HISTORY — PX: FLEXIBLE SIGMOIDOSCOPY: SHX5431

## 2018-05-30 LAB — CBC
HCT: 33.6 % — ABNORMAL LOW (ref 39.0–52.0)
Hemoglobin: 10.9 g/dL — ABNORMAL LOW (ref 13.0–17.0)
MCH: 29.1 pg (ref 26.0–34.0)
MCHC: 32.4 g/dL (ref 30.0–36.0)
MCV: 89.8 fL (ref 80.0–100.0)
NRBC: 0 % (ref 0.0–0.2)
Platelets: 249 10*3/uL (ref 150–400)
RBC: 3.74 MIL/uL — ABNORMAL LOW (ref 4.22–5.81)
RDW: 13.1 % (ref 11.5–15.5)
WBC: 5.8 10*3/uL (ref 4.0–10.5)

## 2018-05-30 LAB — BASIC METABOLIC PANEL
ANION GAP: 7 (ref 5–15)
BUN: 21 mg/dL (ref 8–23)
CALCIUM: 8.7 mg/dL — AB (ref 8.9–10.3)
CO2: 24 mmol/L (ref 22–32)
CREATININE: 1.1 mg/dL (ref 0.61–1.24)
Chloride: 108 mmol/L (ref 98–111)
GFR calc Af Amer: >60 mL/min (ref >60)
GLUCOSE: 98 mg/dL (ref 70–99)
Potassium: 3.9 mmol/L (ref 3.5–5.1)
Sodium: 139 mmol/L (ref 135–145)

## 2018-05-30 LAB — OCCULT BLOOD X 1 CARD TO LAB, STOOL: Fecal Occult Bld: POSITIVE — AB

## 2018-05-30 SURGERY — SIGMOIDOSCOPY, FLEXIBLE

## 2018-05-30 MED ORDER — INFLUENZA VAC SPLIT HIGH-DOSE 0.5 ML IM SUSY
0.5000 mL | PREFILLED_SYRINGE | INTRAMUSCULAR | Status: DC
  Filled 2018-05-30: qty 0.5

## 2018-05-30 MED ORDER — DABIGATRAN ETEXILATE MESYLATE 150 MG PO CAPS
150.0000 mg | ORAL_CAPSULE | Freq: Two times a day (BID) | ORAL | Status: DC
  Administered 2018-05-30 – 2018-05-31 (×2): 150 mg via ORAL
  Filled 2018-05-30 (×2): qty 1

## 2018-05-30 NOTE — Op Note (Signed)
[facility] Patient Name: Connor Duffy Procedure Date : 05/30/2018 MRN: [id number] Attending MD: Ronald Lobo , MD Date of Birth: 11-20-1940 CSN: [id number] Age: 77 Admit Type: Inpatient Procedure:                Flexible Sigmoidoscopy Indications:              Rectal hemorrhage--blood with BM's x 8 days,                            resolved today Providers:                Ronald Lobo, MD, Cleda Daub, RN, Charolette Child, Technician Referring MD:              Medicines:                None Complications:            No immediate complications. Estimated Blood Loss:     Estimated blood loss: none. Procedure:                Pre-Anesthesia Assessment:                           - Prior to the procedure, a History and Physical                            was performed, and patient medications and                            allergies were reviewed. The patient's tolerance of                            previous anesthesia was also reviewed. The risks                            and benefits of the procedure and the sedation                            options and risks were discussed with the patient.                            All questions were answered, and informed consent                            was obtained. Prior Anticoagulants: The patient has                            taken Pradaxa (dabigatran), last dose was 1 day                            prior to procedure. ASA Grade Assessment: III - A                            patient  with severe systemic disease. After                            reviewing the risks and benefits, the patient was                            deemed in satisfactory condition to undergo the                            procedure.                           After obtaining informed consent, the scope was                            passed under direct vision. The CF-HQ190L ([id number])                            Olympus  adult colon was introduced through the anus                            and advanced to 25 cm from the anal verge. The                            flexible sigmoidoscopy was accomplished without                            difficulty. The patient tolerated the procedure                            well. The quality of the bowel preparation was                            adequate. Scope In: 3:52:20 PM Scope Out: 3:58:00 PM Total Procedure Duration: 0 hours 5 minutes 40 seconds  Findings:      The perianal examination was normal.      The digital rectal exam findings include surgically absent prostate.      A localized area of scattered, mildly altered vascular mucosa       (characterized by telangectatic changes) was found in the distal rectum.       This is consistent with chronic radiation changes, s/p EBRT approx 15       years ago for prostate cancer. There was no friability or hemorrhage       noted.      The exam was otherwise normal throughout the examined colon.      A large amount of solid brown stool was found in the rectum and in the       sigmoid colon. It was not blood-tinged.      There is no endoscopic evidence of blood, bleeding, or friability in the       rectum and in the sigmoid colon. Impression:               - A surgically absent prostate found on digital  rectal exam.                           - No blood or bleeding noted in rectum or sigmoid                            at the time of this exam.                           - Altered vascular mucosa in the distal rectum, c/w                            radiation proctitis (chronic radiation change). It                            was not bleeding or friable.                           - Brown stool in the rectum and in the sigmoid                            colon, without any blood-tinging, suggesting the                            absence of a source of bleeding from areas proximal                             to the rectum.                           - Mild, normal internal hemorrhoids without                            evidence of irritation or recent bleeding.                           - No specimens collected. Recommendation:           - Continue present medications.                           - The current findings do not contraindicate                            resumption of anticoagulation. Procedure Code(s):        --- Professional ---                           [phone], Sigmoidoscopy, flexible; diagnostic,                            including collection of specimen(s) by brushing or                            washing, when performed (separate procedure) Diagnosis Code(s):        --- Professional ---  K62.5, Hemorrhage of anus and rectum CPT copyright 2018 American Medical Association. All rights reserved. The codes documented in this report are preliminary and upon coder review may  be revised to meet current compliance requirements. Ronald Lobo, MD 05/30/2018 4:17:00 PM This report has been signed electronically. Number of Addenda: 0

## 2018-05-30 NOTE — Discharge Summary (Signed)
Name: Connor Duffy MRN: [id number] DOB: 1941-03-22 77 y.o. PCP: System, Pcp Not In  Date of Admission: 05/29/2018 11:25 AM Date of Discharge: 05/31/2018 Attending Physician: No att. providers found  Discharge Diagnosis: 1. Lower GI Bleed 2. Hiatal Hernia  Discharge Medications: Allergies as of 05/31/2018   No Known Allergies     Medication List    TAKE these medications   carboxymethylcellulose 0.5 % Soln Commonly known as:  REFRESH PLUS Place 1 drop into both eyes 2 (two) times daily as needed (dry eyes).   dabigatran 150 MG Caps capsule Commonly known as:  PRADAXA Take 150 mg 2 (two) times daily by mouth.   diclofenac sodium 1 % Gel Commonly known as:  VOLTAREN Apply 2 g topically 4 (four) times daily as needed (knee pain).   diltiazem 240 MG 24 hr capsule Commonly known as:  CARDIZEM CD Take 240 mg by mouth daily.   gabapentin 300 MG capsule Commonly known as:  NEURONTIN Take 300 mg at bedtime by mouth.   omeprazole 20 MG capsule Commonly known as:  PRILOSEC Take 1 capsule (20 mg total) by mouth daily.   polyethylene glycol packet Commonly known as:  MIRALAX / GLYCOLAX Take 17 g by mouth 2 (two) times daily.   predniSONE 5 MG tablet Commonly known as:  DELTASONE Take 5 mg daily with breakfast by mouth.   zolpidem 5 MG tablet Commonly known as:  AMBIEN Take 5 mg at bedtime by mouth.   ZYTIGA 250 MG tablet Generic drug:  abiraterone acetate Take 1,000 mg daily by mouth. Take on an empty stomach 1 hour before or 2 hours after a meal       Disposition and follow-up:   Connor Duffy was discharged from [facility] in Stable condition.  At the hospital follow up visit please address:  1.  Lower GI Bleed: Asymptomatic, hemoglobin (~11) and vitals stable throughout admission. Flex sig showed no acute bleed but possible vascular changes 2/2 radiation proctitis. BRB thought to be 2/2 pradaxa and diverticulosis. Pradaxa restarted for hx of  afib and DVTs with instructions to f/u with PCP s/p discharge and f/u instructions for ongoing or increased blood in stool. He is concerned about whether he needs iron supplementation.  Hiatal Hernia: no symptoms of GERD but does have difficulty swallowing large solid food over the past month. Barium swallow showed small hiatal hernia with possible small ulcer. Will need f/u with Dr. Watt Climes in 1-2 months for decision on EGD (per radiology rec) vs. Repeat barium vs. Observation.   2.  Labs / imaging needed at time of follow-up: CBC, Ferritin   3.  Pending labs/ test needing follow-up: none   Hospital Course by problem list: Connor Duffy is a 77yo male with PMH hypertension, atrial fibrillation, prostate cancer s/p prostatectomy 2002 with later radiation for recurrence, mesenteric lymphadenopathy, diverticulosis, and DVT presenting today after seeing blood in his stool starting about eight days ago, described as initially BRB. Previous colonoscopy six months ago with only findings of diverticulitis and benign polyp. Pradaxa was held at admission as well. He was asymptomatic at admission, and hemoglobin and vital signs remained stable throughout hospital stay. GI was consulted and performed flex sigmoidoscopy with no overt bleed found. Small vascular changes consistent with possible radiation proctitis were present although no friable tissue or painful BM. His bleed was also thought to possibly be secondary to diverticulosis and Pradaxa. His Pradaxa was restarted prior to discharge as he remained stable, and  he was given strict instructions for return if symptoms developed or if he had continued bleeding. He also had symptoms of solid food getting stuck in his throat with swallowing for the past month but no symptoms of GERD. Barium swallow showed possible small ulcer and hiatal hernia. He was discharged with 20 mg qd omeprazole and instructions to follow-up with GI in 1-2 months. He also was instructed to  follow-up with his PCP at the [place] in the next 1-2 weeks.   Discharge Vitals:   BP 131/89 (BP Location: Right Arm)   Pulse 79   Temp 98.4 F (36.9 C) (Oral)   Resp 18   Ht 5\' 11"  (1.803 m)   Wt 90.7 kg   SpO2 99%   BMI 27.89 kg/m   Pertinent Labs, Studies, and Procedures:  FOBT: positive   PSA: .11  Flex Sigmoidoscopy 11/26 - A surgically absent prostate found on digital rectal exam. - No blood or bleeding noted in rectum or sigmoid at the time of this exam. - Altered vascular mucosa in the distal rectum, c/w radiation proctitis (chronic radiation change). It was not bleeding or friable. - Brown stool in the rectum and in the sigmoid colon, without any blood-tinging, suggesting the absence of a source of bleeding from areas proximal to the rectum. - Mild, normal internal hemorrhoids without evidence of irritation or recent bleeding. - No specimens collected.  Barium Swallow 11/27 IMPRESSION: 1. Persistent irregularity in the distal esophagus primarily along the left side just proximal to a small type 1 hiatal hernia. The appearance suggests a small ulceration. Endoscopy is recommended for further characterization. 2. Relatively mild nonspecific esophageal dysmotility disorder, with disruption of primary peristaltic waves on 2/4 swallows with a considerable amount of proximal escape of the contrast bolus. 3. Laryngeal penetration of contrast during the pharyngeal phase of swallowing. Vallecular and piriform stasis.  Electronically Signed   By: Van Clines M.D.   On: 05/31/2018 08:54   Discharge Instructions: Discharge Instructions    Diet - low sodium heart healthy   Complete by:  As directed    Discharge instructions   Complete by:  As directed    You were hospitalized for rectal bleeding. Thank you for allowing [place] to be part of your care.   The blood in your stool is possibly related to your diverticulosis and chronic blood thinner medication, Pradaxa. It  is safe to continue taking this medication. If you do notice increased blood in your stool, have symptoms of shortness of breath, increasing abdominal pain, painful bowel movements please call your PCP.   Please call [facility] at the number below and schedule a follow-up appointment for the next 1-2 months.  [facility] [address] [place], [place] [address] Call: [phone]  Please also call your Primary care physician to schedule hospital follow-up within the next 1-2 weeks. I will fax the discharge summary to Dr. Charolotte Eke office so she will have your information.   Please START taking:  Omeprazole (PRILOSEC) 20 mg one tablet in the morning before breakfast. Take this medication for the next eight weeks or until follow-up with gastroenterology to discuss further management.   Increase activity slowly   Complete by:  As directed       Signed: Chelsey Kimberley A, DO 05/31/2018, 1:40 PM   Pager: [phone]

## 2018-05-30 NOTE — Interval H&P Note (Signed)
History and Physical Interval Note:  05/30/2018 2:23 PM  Connor Duffy  has presented today for surgery, with the diagnosis of rectal bleeding  The various methods of treatment have been discussed with the patient and family. After consideration of risks, benefits and other options for treatment, the patient has consented to  Procedure(s) with comments: FLEXIBLE SIGMOIDOSCOPY (N/A) - unprepped, unsedated as a surgical intervention .  The patient's history has been reviewed, patient examined, no change in status, stable for surgery.  I have reviewed the patient's chart and labs.  Questions were answered to the patient's satisfaction.     [name]

## 2018-05-30 NOTE — Plan of Care (Signed)
  Problem: Nutrition: ?Goal: Adequate nutrition will be maintained ?Outcome: Progressing ?  ?Problem: Coping: ?Goal: Level of anxiety will decrease ?Outcome: Progressing ?  ?Problem: Elimination: ?Goal: Will not experience complications related to bowel motility ?Outcome: Progressing ?  ?Problem: Safety: ?Goal: Ability to remain free from injury will improve ?Outcome: Progressing ?  ?Problem: Skin Integrity: ?Goal: Risk for impaired skin integrity will decrease ?Outcome: Progressing ?  ?

## 2018-05-30 NOTE — Progress Notes (Signed)
Medicine attending: I examined this patient today and I concur with the evaluation and management plan as recorded by resident physician Dr Molli Hazard. Patient stable overnight.  No spontaneous hematochezia.  Minimal decrease in hemoglobin from 11.8 to 10.9.  Holding anticoagulants pending GI consultation.  Known diverticulosis with no prior history of diverticulitis.  Will await GI opinion on whether or not he should have a flexible sigmoidoscopy.  In addition, he has upper GI symptoms and may also need upper endoscopy either during this admission or as an outpatient. I would like to get him back on his anticoagulants as soon as we can in view of combined risk factors of paroxysmal atrial fibrillation and history of bilateral lower extremity DVTs.

## 2018-05-30 NOTE — Progress Notes (Signed)
   Subjective:  No acute concerns overnight. No BM since admission. No nausea, dizzness, or abdominal pain.   Objective:  Vital signs in last 24 hours: Vitals:   05/29/18 1430 05/29/18 1716 05/29/18 2013 05/30/18 0441  BP: 134/83 138/72 135/74 135/71  Pulse: 64 71 74 66  Resp: 20 20 19 19   Temp:   99.3 F (37.4 C) 98 F (36.7 C)  TempSrc:   Oral Oral  SpO2: 97% 97% 97% 99%  Weight:      Height:       Physical Exam Constitution: NAD, lying supine in bed Cardio: RRR, no m/r/g Respiratory: CTAB, no wheezing or rales Abdominal: soft, non-distended, NTTP all quadrants MSK: +2 pitting edema, moving all extremities Neuro: a&o, cooperative Skin: c/d/i; LE petechiae    Assessment/Plan:  Active Problems:   Lower GI bleed  Lower GI Bleed No BM since admission. No abdominal tenderness in LLQ today. GI consulted and appreciate their recommendations. Hemoglobin remains stable 10.9 << 11.8 yesterday.   - holding pradaxa, SCDs in place - am CBC  - f/u GI recs  Atrial Fibrillation EKG sinus rhythm with 1st degree AV block and partial RBBB both seen on previous EKG  - holding pradaxa - cont. dilt 240 mg qd   Hx Prostate Ca s/p Prostatectomy PSA .11 wnl  - cont. Prednisone & zytiga  VTE: SCDs IVF: None Diet: heart healthy Code: full   Dispo: Anticipated discharge in approximately 1-2 days.   Molli Hazard A, DO 05/30/2018, 9:21 AM Pager: [phone]

## 2018-05-30 NOTE — Consult Note (Signed)
Referring Provider: Internal medicine teaching service (Dr. Beryle Beams, attending) Primary Care Physician:  System, Pcp Not In Primary Gastroenterologist:  Dr. Watt Climes  Reason for Consultation: Rectal bleeding  HPI: Connor Duffy is a 77 y.o. male on Pradaxa for atrial fibrillation and a DVT, who has a roughly 1 week history of blood with his bowel movements, without any perianal discomfort but with a sense of some generalized rumbling or discomfort in his mid abdominal region.    This rectal bleeding has not been associated with any drop in hemoglobin; hemoglobin 5 days ago was 11.3 and today it is 10.9.  (His exact baseline is unknown but a year ago his hemoglobin was 13.5).    Interestingly, the patient just had a large bowel movement which was saved in the bathroom, and is formed and soft in character, with no visible blood present.  He does indicate that he sees blood when he wipes.  The patient had colonoscopy in June of this year by Dr. Watt Climes which showed left-sided diverticulosis and internal and external hemorrhoids.    In addition, the patient has a history of radiation treatments following prostatectomy, approximately 15 years ago.  Apparently, no radiation proctitis was noted at the time of his colonoscopy.  He also reports intermittent dysphagia to solid foods, especially meat and sticky foods such as bread.  No anorexia, no weight loss.  No dysphagia to liquids.  The symptoms have been going on perhaps 5 or 6 months.   Past Medical History:  Diagnosis Date  . Arthritis   . Atrial fibrillation ([place])   . Cancer ([place])    PROSTATE  . Dysrhythmia   . Spinal stenosis     Past Surgical History:  Procedure Laterality Date  . BACK SURGERY     spinal stenosis surgery and herniated disc surgery  . CARDIAC CATHETERIZATION N/A 04/08/2015   Procedure: Left Heart Cath and Coronary Angiography;  Surgeon: Jerline Pain, MD;  Location: [facility];  Service: Cardiovascular;   Laterality: N/A;  . HERNIA REPAIR     inguinal bilateral  . JOINT REPLACEMENT     Left total knee Dr. Alvan Dame 05/24/17  . KNEE ARTHROSCOPY  LEFT   Also right  . PENILE PROSTHESIS IMPLANT    . PROSTATECTOMY    . TOTAL HIP ARTHROPLASTY  07/08/2011   Procedure: TOTAL HIP ARTHROPLASTY ANTERIOR APPROACH;  Surgeon: Mcarthur Rossetti;  Location: [facility];  Service: Orthopedics;  Laterality: Right;  Right Total Hip Arthroplasty, Direct Anterior Approach   (c-arm)  . TOTAL KNEE ARTHROPLASTY Left 05/24/2017   Procedure: LEFT TOTAL KNEE ARTHROPLASTY;  Surgeon: Paralee Cancel, MD;  Location: [facility];  Service: Orthopedics;  Laterality: Left;  Adductor Block    Prior to Admission medications   Medication Sig Start Date End Date Taking? Authorizing Provider  abiraterone acetate (ZYTIGA) 250 MG tablet Take 1,000 mg daily by mouth. Take on an empty stomach 1 hour before or 2 hours after a meal   Yes [provider]  carboxymethylcellulose (REFRESH PLUS) 0.5 % SOLN Place 1 drop into both eyes 2 (two) times daily as needed (dry eyes).   Yes [provider]  dabigatran (PRADAXA) 150 MG CAPS capsule Take 150 mg 2 (two) times daily by mouth.   Yes [provider]  diclofenac sodium (VOLTAREN) 1 % GEL Apply 2 g topically 4 (four) times daily as needed (knee pain).   Yes [provider]  diltiazem (CARDIZEM CD) 240 MG 24 hr capsule Take  240 mg by mouth daily.    Yes [provider]  gabapentin (NEURONTIN) 300 MG capsule Take 300 mg at bedtime by mouth.   Yes [provider]  predniSONE (DELTASONE) 5 MG tablet Take 5 mg daily with breakfast by mouth.   Yes [provider]  zolpidem (AMBIEN) 5 MG tablet Take 5 mg at bedtime by mouth.   Yes [provider]  polyethylene glycol (MIRALAX / GLYCOLAX) packet Take 17 g by mouth 2 (two) times daily. Patient not taking: Reported on 05/29/2018 05/25/17   Danae Orleans, PA-C    Current  Facility-Administered Medications  Medication Dose Route Frequency Provider Last Rate Last Dose  . abiraterone acetate (ZYTIGA) tablet 1,000 mg  1,000 mg Oral Daily Santos-Sanchez, Idalys, MD      . acetaminophen (TYLENOL) tablet 650 mg  650 mg Oral Q6H PRN Santos-Sanchez, Merlene Morse, MD       Or  . acetaminophen (TYLENOL) suppository 650 mg  650 mg Rectal Q6H PRN Santos-Sanchez, Idalys, MD      . diltiazem (CARDIZEM CD) 24 hr capsule 240 mg  240 mg Oral Daily Santos-Sanchez, Idalys, MD   240 mg at 05/30/18 0951  . gabapentin (NEURONTIN) capsule 300 mg  300 mg Oral QHS Santos-Sanchez, Merlene Morse, MD   300 mg at 05/29/18 2245  . [START ON 05/31/2018] Influenza vac split quadrivalent PF (FLUZONE HIGH-DOSE) injection 0.5 mL  0.5 mL Intramuscular Tomorrow-1000 Annia Belt, MD      . polyvinyl alcohol (LIQUIFILM TEARS) 1.4 % ophthalmic solution 1 drop  1 drop Both Eyes BID PRN Santos-Sanchez, Idalys, MD      . predniSONE (DELTASONE) tablet 5 mg  5 mg Oral Q breakfast Welford Roche, MD   5 mg at 05/30/18 0951  . sodium chloride flush (NS) 0.9 % injection 3 mL  3 mL Intravenous Q12H Santos-Sanchez, Idalys, MD   3 mL at 05/30/18 0956  . zolpidem (AMBIEN) tablet 5 mg  5 mg Oral QHS Santos-Sanchez, Merlene Morse, MD   5 mg at 05/29/18 2245    Allergies as of 05/29/2018  . (No Known Allergies)    Family History  Problem Relation Age of Onset  . Cancer - Colon Father     Social History   Socioeconomic History  . Marital status: Married    Spouse name: Not on file  . Number of children: Not on file  . Years of education: Not on file  . Highest education level: Not on file  Occupational History  . Not on file  Social Needs  . Financial resource strain: Not on file  . Food insecurity:    Worry: Not on file    Inability: Not on file  . Transportation needs:    Medical: Not on file    Non-medical: Not on file  Tobacco Use  . Smoking status: Never Smoker  . Smokeless tobacco: Never Used   Substance and Sexual Activity  . Alcohol use: No    Frequency: Never  . Drug use: No  . Sexual activity: Yes  Lifestyle  . Physical activity:    Days per week: Not on file    Minutes per session: Not on file  . Stress: Not on file  Relationships  . Social connections:    Talks on phone: Not on file    Gets together: Not on file    Attends religious service: Not on file    Active member of club or organization: Not on file    Attends  meetings of clubs or organizations: Not on file    Relationship status: Not on file  . Intimate partner violence:    Fear of current or ex partner: Not on file    Emotionally abused: Not on file    Physically abused: Not on file    Forced sexual activity: Not on file  Other Topics Concern  . Not on file  Social History Narrative  . Not on file    Review of Systems: See history of present illness  Physical Exam: Vital signs in last 24 hours: Temp:  [98 F (36.7 C)-99.3 F (37.4 C)] 98.3 F (36.8 C) (11/26 0950) Pulse Rate:  [64-87] 87 (11/26 0950) Resp:  [16-23] 16 (11/26 0950) BP: (122-164)/(71-99) 122/77 (11/26 0950) SpO2:  [94 %-99 %] 95 % (11/26 0950) Weight:  [90.7 kg] 90.7 kg (11/25 1132) Last BM Date: 05/30/18  This is a stocky, healthy-appearing gentleman in no distress.  Wife at bedside.  No pallor, no icterus.  Chest clear, heart without murmur or arrhythmia at present.  Abdomen without guarding, mass-effect, or tenderness.  Rectal exam deferred until sigmoidoscopy later today (see below).  No evident focal neurologic deficits, cognitive impairment, skin rashes, or musculoskeletal abnormalities.  Intake/Output from previous day: No intake/output data recorded. Intake/Output this shift: No intake/output data recorded.  Lab Results: Recent Labs    05/29/18 1144 05/30/18 0511  WBC 7.1 5.8  HGB 11.8* 10.9*  HCT 37.5* 33.6*  PLT 289 249   BMET Recent Labs    05/29/18 1144 05/30/18 0511  NA 139 139  K 3.7 3.9  CL  109 108  CO2 24 24  GLUCOSE 97 98  BUN 24* 21  CREATININE 1.06 1.10  CALCIUM 9.0 8.7*   LFT Recent Labs    05/29/18 1144  PROT 6.2*  ALBUMIN 3.7  AST 23  ALT 16  ALKPHOS 67  BILITOT 0.8   PT/INR No results for input(s): LABPROT, INR in the last 72 hours.  Studies/Results: No results found.  Impression: The overall picture suggests a distal source of his bleeding, perhaps radiation proctitis, perhaps hemorrhoids.  His intermittent dysphagia could be presbyesophagus or perhaps a Schatzki's ring.  Plan: 1.  Unprepped, unsedated flexible sigmoidoscopy today.  If it appears that there is blood coming from above, we should consider doing an updated colonoscopy.  Further management, and recommendations regarding resumption of anticoagulation, to depend on the sigmoidoscopic findings.  2.  Barium swallow with tablet to assess motility of the esophagus and to check for strictures.  If the patient is otherwise ready to be discharged before this exam is performed, it could readily be done as an outpatient, by referring the patient back to Dr. Watt Climes, his primary gastroenterologist.  If the barium swallow is performed and is negative, I would consider an empiric trial of PPI therapy, which often helps with swallowing and esophageal function.  This seems to be true even in patients who do not necessarily have demonstrable reflux symptomatology.   LOS: 1 day   Youlanda Mighty Tiwanna Tuch  05/30/2018, 11:07 AM   Pager [phone] If no answer or after 5 PM call [phone]

## 2018-05-30 NOTE — Progress Notes (Signed)
Flexible sigmoidoscopy well-tolerated.  Please see dictated report for details.  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  He does not have significant hemorrhoids, but that could be an alternative explanation.  It appears very likely that the blood is not arising from any source proximal to the rectum.  Recommendations:  1.  Await barium swallow tomorrow to assess dysphagia symptoms 2.  No contraindication to resumption of anticoagulation based on current sigmoidoscopic findings 3.  If annoying, persistent, or bothersome rectal bleeding occurs, could consider treatment for his radiation proctitis, using radiofrequency ablation or argon plasma coagulator therapy.  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  Cleotis Nipper, M.D. Pager [phone] If no answer or after 5 PM call [phone]

## 2018-05-30 NOTE — Progress Notes (Signed)
Pt placed on NPO at 12:05.

## 2018-05-31 ENCOUNTER — Inpatient Hospital Stay (HOSPITAL_COMMUNITY): Payer: No Typology Code available for payment source

## 2018-05-31 LAB — CBC
HCT: 36 % — ABNORMAL LOW (ref 39.0–52.0)
HEMOGLOBIN: 11.7 g/dL — AB (ref 13.0–17.0)
MCH: 29 pg (ref 26.0–34.0)
MCHC: 32.5 g/dL (ref 30.0–36.0)
MCV: 89.3 fL (ref 80.0–100.0)
NRBC: 0 % (ref 0.0–0.2)
Platelets: 263 10*3/uL (ref 150–400)
RBC: 4.03 MIL/uL — AB (ref 4.22–5.81)
RDW: 13 % (ref 11.5–15.5)
WBC: 5.1 10*3/uL (ref 4.0–10.5)

## 2018-05-31 MED ORDER — OMEPRAZOLE 20 MG PO CPDR: 20.0000 mg | DELAYED_RELEASE_CAPSULE | Freq: Every day | ORAL | 1 refills | Status: DC

## 2018-05-31 NOTE — Progress Notes (Signed)
   Subjective:  He is feeling well this morning. He continues to have small amount of blood in his stool since admission but has not symptoms of dizziness, abdmoninal pain, nausea, weakness, tingling.   Objective:  Vital signs in last 24 hours: Vitals:   05/30/18 1622 05/30/18 2003 05/31/18 0038 05/31/18 0410  BP: 140/89 113/79 133/85 129/80  Pulse: 75 83 81 74  Resp: 16 16 16 16   Temp: 98.6 F (37 C) 98.6 F (37 C) 98.4 F (36.9 C) 97.9 F (36.6 C)  TempSrc: Oral Oral Oral Oral  SpO2: 99% 97% 97% 97%  Weight:      Height:       Physical Exam: Constitution: NAD, lying supine in bed Cardio: RRR, no m/r/g Respiratory: CTAB, no wheezing or rales Abdominal: mild TTP LLQ, soft, non-distended Neuro: a&o, normal affect Skin: c/d/i   Assessment/Plan:  Active Problems:   Acute lower GI bleeding   Chronic anticoagulation   History of deep vein thrombosis   Dysphagia   Gastrointestinal Bleed Likely 2/2 to diverticulosis in the setting of anticoagulant use. No bleeding or areas of concern were seen on flex sigmoidoscopy done yesterday with GI except for small hemorrhoid and telangiectatic changes consistent with possible radiation proctitis as well, and this remains in the differential. He does endorse continued small amount of blood in his stool but hemoglobin remains stable. With no occult blood seen on flex sig and stable hemoglobin, he will be safe to continue his pradaxa and continue to monitor with his PCP.   Dysphagia  - Barium swallow planned for this morning, will f/u GI recs. Appreciate input.   Afib  - restart pradaxa - cont. dilt 240 mg qd    VTE: pradaxa IVF: none Diet: heart healthy Code:  Dispo: Anticipated discharge today.   Darric Plante A, DO 05/31/2018, 9:30 AM Pager: [phone]

## 2018-05-31 NOTE — Plan of Care (Signed)
  Problem: Health Behavior/Discharge Planning: Goal: Ability to manage health-related needs will improve Outcome: Progressing   

## 2018-05-31 NOTE — Consult Note (Signed)
[facility] CM Primary Care Navigator  05/31/2018  Daryn Hicks 1940-08-29 [id number]   Met with patientand wife [name]) at the bedsideto identify possible discharge needs. Patient is getting ready to be discharged and volunteer staff came in to transport patient downstairs during this visit.  Patientreportshaving persistent "blood in stool" thathad ledto this admission. (acute lower GI bleeding post flexible sigmoidoscopy, dysphagia)  PatientendorsesDr. Glennis Brink with [facility] as his primary care provider (not a [facility] provider).Per Epic, patient's primary care provideris not in the system.  Anticipated discharge plan is home per patient.  Patientand wife expressed understanding to call primary care provider's office whenpatient gets home for a post discharge follow-up visit within1- 2 weeksor sooner if needs arise and in order for primary care provider to help managehishealth issues/ needs.  Patientwas encouragedto seekreferral to [facility] care management from providerifdeemed necessary and appropriate forany services in thefuture.   [facility] care management information provided for future needs thatpatientmay have.   For additional questions please contact:  Edwena Felty A. Joshwa Hemric, BSN, RN-BC [facility] PRIMARY CARE Navigator Cell: [phone]

## 2018-05-31 NOTE — Progress Notes (Signed)
Discussed case w/ medical resident.  BaS shows slight mucosal irregularity, ?small ulcer.  Pt still repts bld w/ BM.  RECOMM:  1. Plan for dischg noted, agree 2. Pt/wife advised to call for f/u appt w/ Dr. Watt Climes (primary GI doc) to address whether ongoing bld w/ BM's needs any further Dx or Rx 3. Agree w/ sending home on PPI b/o upper tr sx; Dr. Watt Climes and pt can discuss whether to do egd electively  Cleotis Nipper, M.D. Pager [phone] If no answer or after 5 PM call [phone]

## 2018-05-31 NOTE — Discharge Instructions (Signed)

## 2018-05-31 NOTE — Progress Notes (Signed)
Medicine attending: I examined this patient today and I concur with the evaluation and management plan as recorded by resident physician Dr Molli Hazard.  Since her note, we now have results of the barium swallow done this morning which is detailed below. The patient did see some blood again at time of his last bowel movement. Hemoglobins have been stable. Barium swallow done this morning: Small type 1 hiatal hernia. There is mild irregularity in the distal esophagus just proximal to the hiatal hernia, for example on image 34/7, suggesting a small ulceration. A 13 mm barium tablet passed without difficulty into stomach. General exam is unchanged.  Hemoglobin 11.7.  He is back on his anticoagulants. Impression: 1.  Likely lower GI bleed, minor, self-limited. May be related to anticoagulant therapy.  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 We discussed risk benefit of continuing anticoagulant therapy and benefit exceeds risk at this time. 2.  Possible small distal esophageal ulcer Endoscopy can be deferred to the outpatient setting.  Unlikely this is the source of his bleeding.  We can start a PPI. Disposition: Condition stable Probable discharge today pending review by gastroenterology

## 2018-06-12 DIAGNOSIS — R591 Generalized enlarged lymph nodes: Secondary | ICD-10-CM | POA: Diagnosis not present

## 2018-09-29 ENCOUNTER — Ambulatory Visit (INDEPENDENT_AMBULATORY_CARE_PROVIDER_SITE_OTHER): Payer: Medicare HMO | Admitting: Family Medicine

## 2018-11-21 ENCOUNTER — Telehealth: Payer: Self-pay | Admitting: Cardiology

## 2018-11-21 NOTE — Telephone Encounter (Signed)
Noted! Thank you

## 2018-11-21 NOTE — Telephone Encounter (Signed)
Called patient to schedule 6 month follow up. He said that he had an ablation on 11/17/2018 and feels like he does not need to schedule an appointment with Dr. Marlou Porch. I deleted his recall for Feb. 2020.

## 2019-04-03 ENCOUNTER — Telehealth: Payer: Self-pay | Admitting: Licensed Clinical Social Worker

## 2019-04-03 NOTE — Telephone Encounter (Signed)
Received a genetic counseling appt from the [place]. Mr. Fine has been cld and scheduled to see  Faith Rogue on 10/8 at 2pm. Pt aware to arrive 15 minutes early.

## 2019-04-12 ENCOUNTER — Other Ambulatory Visit: Payer: Self-pay

## 2019-04-12 ENCOUNTER — Inpatient Hospital Stay
Payer: No Typology Code available for payment source | Attending: Genetic Counselor | Admitting: Licensed Clinical Social Worker

## 2019-04-12 ENCOUNTER — Inpatient Hospital Stay: Payer: No Typology Code available for payment source

## 2019-04-12 ENCOUNTER — Encounter: Payer: Self-pay | Admitting: Licensed Clinical Social Worker

## 2019-04-12 DIAGNOSIS — Z803 Family history of malignant neoplasm of breast: Secondary | ICD-10-CM | POA: Diagnosis not present

## 2019-04-12 DIAGNOSIS — Z800 Family history of malignant neoplasm of digestive organs: Secondary | ICD-10-CM

## 2019-04-12 DIAGNOSIS — Z8042 Family history of malignant neoplasm of prostate: Secondary | ICD-10-CM | POA: Insufficient documentation

## 2019-04-12 DIAGNOSIS — C801 Malignant (primary) neoplasm, unspecified: Secondary | ICD-10-CM | POA: Diagnosis not present

## 2019-04-12 NOTE — Progress Notes (Signed)
REFERRING PROVIDER: [facility] [address] [place],  [place] [address]  PRIMARY PROVIDER:  System, Pcp Not In  PRIMARY REASON FOR VISIT:  1. Cancer ([place])   2. Family history of breast cancer   3. Family history of prostate cancer   4. Family history of colon cancer   5. Family history of breast cancer in male      HISTORY OF PRESENT ILLNESS:   Connor Duffy, a 78 y.o. male, was seen for a [place] cancer genetics consultation at the request of the [facility] due to a personal and family history of cancer.  Connor Duffy presents to clinic today to discuss the possibility of a hereditary predisposition to cancer, genetic testing, and to further clarify his future cancer risks, as well as potential cancer risks for family members.   In 2002, at the age of 72, Connor Duffy was diagnosed with prostate cancer. This was treated with prostatectomy and radiation a year later. He reports his PSA has been rising, and that this is now being called metastatic prostate cancer.   Connor Duffy has had many colonoscopies, he reports there have been a few polyps but not more than 5. He denies radiation exposure except for his cancer treatment.   Past Medical History:  Diagnosis Date  . Arthritis   . Atrial fibrillation ([place])   . Cancer ([place])    PROSTATE  . Dysrhythmia   . Family history of breast cancer   . Family history of breast cancer in male   . Family history of colon cancer   . Family history of prostate cancer   . Spinal stenosis     Past Surgical History:  Procedure Laterality Date  . BACK SURGERY     spinal stenosis surgery and herniated disc surgery  . CARDIAC CATHETERIZATION N/A 04/08/2015   Procedure: Left Heart Cath and Coronary Angiography;  Surgeon: Jerline Pain, MD;  Location: [facility];  Service: Cardiovascular;  Laterality: N/A;  . FLEXIBLE SIGMOIDOSCOPY N/A 05/30/2018   Procedure: FLEXIBLE SIGMOIDOSCOPY;  Surgeon: Ronald Lobo, MD;  Location: [facility]  ENDOSCOPY;  Service: Endoscopy;  Laterality: N/A;  unprepped, unsedated  . HERNIA REPAIR     inguinal bilateral  . JOINT REPLACEMENT     Left total knee Dr. Alvan Dame 05/24/17  . KNEE ARTHROSCOPY  LEFT   Also right  . PENILE PROSTHESIS IMPLANT    . PROSTATECTOMY    . TOTAL HIP ARTHROPLASTY  07/08/2011   Procedure: TOTAL HIP ARTHROPLASTY ANTERIOR APPROACH;  Surgeon: Mcarthur Rossetti;  Location: [facility];  Service: Orthopedics;  Laterality: Right;  Right Total Hip Arthroplasty, Direct Anterior Approach   (c-arm)  . TOTAL KNEE ARTHROPLASTY Left 05/24/2017   Procedure: LEFT TOTAL KNEE ARTHROPLASTY;  Surgeon: Paralee Cancel, MD;  Location: [facility];  Service: Orthopedics;  Laterality: Left;  Adductor Block    Social History   Socioeconomic History  . Marital status: Married    Spouse name: Not on file  . Number of children: Not on file  . Years of education: Not on file  . Highest education level: Not on file  Occupational History  . Not on file  Social Needs  . Financial resource strain: Not on file  . Food insecurity    Worry: Not on file    Inability: Not on file  . Transportation needs    Medical: Not on file    Non-medical: Not on file  Tobacco Use  . Smoking status: Never  Smoker  . Smokeless tobacco: Never Used  Substance and Sexual Activity  . Alcohol use: No    Frequency: Never  . Drug use: No  . Sexual activity: Yes  Lifestyle  . Physical activity    Days per week: Not on file    Minutes per session: Not on file  . Stress: Not on file  Relationships  . Social Herbalist on phone: Not on file    Gets together: Not on file    Attends religious service: Not on file    Active member of club or organization: Not on file    Attends meetings of clubs or organizations: Not on file    Relationship status: Not on file  Other Topics Concern  . Not on file  Social History Narrative  . Not on file     FAMILY HISTORY:  We obtained a detailed, 4-generation family  history.  Significant diagnoses are listed below: Family History  Problem Relation Age of Onset  . Cancer - Colon Father        dx 38s  . Breast cancer Father        unsure age of dx  . Breast cancer Sister        dx 36s  . Prostate cancer Brother 51   Connor Duffy had 2 sons and 1 daughter. His son, Audelia Acton, passed away at 32, no cancer. Connor Duffy had 2 brothers and 3 sisters. One of his brothers has passed. His other brother was diagnosed with prostate cancer in his 30s. One of his sisters, Connor Duffy, was diagnosed with breast cancer in her 55s. No known cancers in nieces/nephews.  Connor Duffy mother died at 75 due to diabetes complications. Patient had 4 maternal uncles, 3 maternal aunts, no known cancers. No known cancers in maternal cousins. His maternal grandmother died in her early 34s, maternal grandfather died at 48.  Connor Duffy father was diagnosed with colon cancer in his 25s and died at 42. Patient also believes his father was diagnosed with breast cancer at some point, unsure of age at diagnosis. Patient had 9 paternal uncles, 4 paternal aunts. No known cancers, and no cancers in paternal cousins. His paternal grandmother died at 28, paternal grandmother died in her 75s.   Connor Duffy is unaware of previous family history of genetic testing for hereditary cancer risks. Patient's maternal ancestors are of [place] descent, and paternal ancestors are of [place], [place], [place], [place] descent. There is no reported Ashkenazi Jewish ancestry. There is no known consanguinity.  GENETIC COUNSELING ASSESSMENT: Connor Duffy is a 78 y.o. male with a personal and family history which is somewhat suggestive of a hereditary cancer syndrome and predisposition to cancer. We, therefore, discussed and recommended the following at today's visit.   DISCUSSION: We discussed that 5 - 10% of prostate cancer is hereditary. There are some genes that are associated with both breast and prostate cancer, such as the  BRCA1/BRCA2 genes.  There are other genes associated with other hereditary cancer syndromes as well. We discussed that testing is beneficial for several reasons including knowing how to follow individuals after completing their treatment, and understand if other family members could be at risk for cancer and allow them to undergo genetic testing.   We reviewed the characteristics, features and inheritance patterns of hereditary cancer syndromes. We also discussed genetic testing, including the appropriate family members to test, the process of testing, insurance coverage and turn-around-time for results. We discussed the implications  of a negative, positive and/or variant of uncertain significant result. We recommended Connor Duffy pursue genetic testing for the [employer] gene panel.   The CancerNext gene panel offered by [employer] includes sequencing and rearrangement analysis for the following 34 genes:   APC, ATM, BARD1, BMPR1A, BRCA1, BRCA2, BRIP1, CDH1, CDK4, CDKN2A, CHEK2, DICER1, EPCAM, GREM1, HOXB13, MLH1, MRE11A, MSH2, MSH6, MUTYH, NBN, NF1, PALB2, PMS2, POLD1, POLE, PTEN, RAD50, RAD51C, RAD51D, SMAD4, SMARCA4, STK11, and TP53.   Based on Connor Duffy's personal and family history of cancer, he meets medical criteria for genetic testing. Despite that he meets criteria, he may still have an out of pocket cost. We discussed that if his out of pocket cost for testing is over $100, the laboratory will call and confirm whether he wants to proceed with testing.  If the out of pocket cost of testing is less than $100 he will be billed by the genetic testing laboratory.   PLAN: After considering the risks, benefits, and limitations, Connor Duffy provided informed consent to pursue genetic testing. We will await [facility] authorization, and then send a saliva kit to his home, and the sample will be sent to [facility] for analysis of the CancerNext panel. Once saliva kit is received by the lab, results  should be available within approximately 2-3 weeks' time, at which point they will be disclosed by telephone to Connor Duffy, as will any additional recommendations warranted by these results. Connor Duffy will receive a summary of his genetic counseling visit and a copy of his results once available. This information will also be available in Epic.   Lastly, we encouraged Connor Duffy to remain in contact with cancer genetics annually so that we can continuously update the family history and inform him of any changes in cancer genetics and testing that may be of benefit for this family.   Connor Duffy questions were answered to his satisfaction today. Our contact information was provided should additional questions or concerns arise. Thank you for the referral and allowing [place] to share in the care of your patient.   Faith Rogue, MS, [facility] Genetic Counselor [email or web address] Phone: [phone]  The patient was seen for a total of 30 minutes in face-to-face genetic counseling.  Drs. Magrinat, Lindi Adie and/or Burr Medico were available for discussion regarding this case.   _______________________________________________________________________ For Office Staff:  Number of people involved in session: 1 Was an Intern/ student involved with case: no

## 2019-04-20 ENCOUNTER — Telehealth: Payer: Self-pay | Admitting: Licensed Clinical Social Worker

## 2019-04-20 NOTE — Telephone Encounter (Signed)
Faxed medical records to [facility] at [phone]. Release [id number]

## 2019-05-25 ENCOUNTER — Telehealth: Payer: Self-pay | Admitting: Licensed Clinical Social Worker

## 2019-05-30 ENCOUNTER — Ambulatory Visit: Payer: Self-pay | Admitting: Licensed Clinical Social Worker

## 2019-05-30 ENCOUNTER — Encounter: Payer: Self-pay | Admitting: Licensed Clinical Social Worker

## 2019-05-30 DIAGNOSIS — Z800 Family history of malignant neoplasm of digestive organs: Secondary | ICD-10-CM

## 2019-05-30 DIAGNOSIS — Z803 Family history of malignant neoplasm of breast: Secondary | ICD-10-CM

## 2019-05-30 DIAGNOSIS — Z8042 Family history of malignant neoplasm of prostate: Secondary | ICD-10-CM

## 2019-05-30 DIAGNOSIS — C801 Malignant (primary) neoplasm, unspecified: Secondary | ICD-10-CM

## 2019-05-30 DIAGNOSIS — Z1379 Encounter for other screening for genetic and chromosomal anomalies: Secondary | ICD-10-CM | POA: Insufficient documentation

## 2019-05-30 NOTE — Telephone Encounter (Signed)
Revealed negative genetic testing.  Revealed that a VUS in APC was identified. This normal result is reassuring and indicates that it is unlikely Connor Duffy's cancer is due to a hereditary cause.  It is unlikely that there is an increased risk of another cancer due to a mutation in one of these genes.  However, genetic testing is not perfect, and cannot definitively rule out a hereditary cause.  It will be important for him to keep in contact with genetics to learn if any additional testing may be needed in the future.

## 2019-05-30 NOTE — Progress Notes (Signed)
HPI:  Mr. Hara was previously seen in the [facility] clinic due to a personal and family history of cancer and concerns regarding a hereditary predisposition to cancer. Please refer to our prior cancer genetics clinic note for more information regarding our discussion, assessment and recommendations, at the time. Mr. Renfroe recent genetic test results were disclosed to him, as were recommendations warranted by these results. These results and recommendations are discussed in more detail below.  CANCER HISTORY:  Oncology History   No history exists.   In 2002, at the age of 43, Mr. Isidro was diagnosed with prostate cancer. This was treated with prostatectomy and radiation a year later. He reports his PSA has been rising, and that this is now being called metastatic prostate cancer.   FAMILY HISTORY:  We obtained a detailed, 4-generation family history.  Significant diagnoses are listed below: Family History  Problem Relation Age of Onset   Cancer - Colon Father        dx 59s   Breast cancer Father        unsure age of dx   Breast cancer Sister        dx 24s   Prostate cancer Brother 76    Mr. Demeter had 2 sons and 1 daughter. His son, Audelia Acton, passed away at 68, no cancer. Mr. Skaggs had 2 brothers and 3 sisters. One of his brothers has passed. His other brother was diagnosed with prostate cancer in his 63s. One of his sisters, Izora Gala, was diagnosed with breast cancer in her 53s. No known cancers in nieces/nephews.  Mr. Folkert mother died at 24 due to diabetes complications. Patient had 4 maternal uncles, 3 maternal aunts, no known cancers. No known cancers in maternal cousins. His maternal grandmother died in her early 75s, maternal grandfather died at 73.  Mr. Escandon father was diagnosed with colon cancer in his 37s and died at 33. Patient also believes his father was diagnosed with breast cancer at some point, unsure of age at diagnosis. Patient had 9 paternal uncles,  4 paternal aunts. No known cancers, and no cancers in paternal cousins. His paternal grandmother died at 23, paternal grandmother died in her 1s.   Mr. Nebel is unaware of previous family history of genetic testing for hereditary cancer risks. Patient's maternal ancestors are of [place] descent, and paternal ancestors are of [place], [place], [place], [place] descent. There is no reported Ashkenazi Jewish ancestry. There is no known consanguinity.  GENETIC TEST RESULTS: Genetic testing reported out on 05/24/2019 through the [place] cancer panel found no pathogenic mutations.  The CancerNext gene panel offered by [employer] includes sequencing and rearrangement analysis for the following 36 genes: APC*, ATM*, AXIN2, BARD1, BMPR1A, BRCA1*, BRCA2*, BRIP1*, CDH1*, CDK4, CDKN2A, CHEK2*, DICER1, MLH1*, MSH2*, MSH3, MSH6*, MUTYH*, NBN, NF1*, NTHL1, PALB2*, PMS2*, PTEN*, RAD51C*, RAD51D*, RECQL, SMAD4, SMARCA4, STK11 and TP53* (sequencing and deletion/duplication); HOXB13, POLD1 and POLE (sequencing only); EPCAM and GREM1 (deletion/duplication only).   The test report has been scanned into EPIC and is located under the Molecular Pathology section of the Results Review tab.  A portion of the result report is included below for reference.     We discussed with Mr. Harmening that because current genetic testing is not perfect, it is possible there may be a gene mutation in one of these genes that current testing cannot detect, but that chance is small.  We also discussed, that there could be another gene that has not yet been  discovered, or that we have not yet tested, that is responsible for the cancer diagnoses in the family. It is also possible there is a hereditary cause for the cancer in the family that Mr. Roycroft did not inherit and therefore was not identified in his testing.  Therefore, it is important to remain in touch with cancer genetics in the future so that we can continue to offer Mr. Cowans  the most up to date genetic testing.   Genetic testing did identify a variant of uncertain significance (VUS) was identified in the APC gene.  At this time, it is unknown if this variant is associated with increased cancer risk or if this is a normal finding, but most variants such as this get reclassified to being inconsequential. It should not be used to make medical management decisions. With time, we suspect the lab will determine the significance of this variant, if any. If we do learn more about it, we will try to contact Mr. Bertoni to discuss it further. However, it is important to stay in touch with [place] periodically and keep the address and phone number up to date.    ADDITIONAL GENETIC TESTING: We discussed with Mr. Deroos that his genetic testing was fairly extensive.  If there are genes identified to increase cancer risk that can be analyzed in the future, we would be happy to discuss and coordinate this testing at that time.    CANCER SCREENING RECOMMENDATIONS: Mr. Higinbotham test result is considered negative (normal).  This means that we have not identified a hereditary cause for his personal and family history of cancer at this time. Most cancers happen by chance and this negative test suggests that his cancer may fall into this category.    While reassuring, this does not definitively rule out a hereditary predisposition to cancer. It is still possible that there could be genetic mutations that are undetectable by current technology. There could be genetic mutations in genes that have not been tested or identified to increase cancer risk.  Therefore, it is recommended he continue to follow the cancer management and screening guidelines provided by his oncology and primary healthcare provider.   An individual's cancer risk and medical management are not determined by genetic test results alone. Overall cancer risk assessment incorporates additional factors, including personal medical history,  family history, and any available genetic information that may result in a personalized plan for cancer prevention and surveillance.  RECOMMENDATIONS FOR FAMILY MEMBERS:  Relatives in this family might be at some increased risk of developing cancer, over the general population risk, simply due to the family history of cancer.  We recommended male relatives in this family have a yearly mammogram beginning at age 83, or 46 years younger than the earliest onset of cancer, an annual clinical breast exam, and perform monthly breast self-exams. Male relatives in this family should also have a gynecological exam as recommended by their primary provider. All family members should have a colonoscopy by age 18, or as directed by their physicians.  It is also possible there is a hereditary cause for the cancer in Mr. Towles's family that he did not inherit and therefore was not identified in him.  Based on Mr. Bonnin's family history of his father with breast cancer, we recommended his siblings/paternal relatives have genetic counseling and testing. Mr. Quezada will let us know if we can be of any assistance in coordinating genetic counseling and/or testing for these family members.  FOLLOW-UP: Lastly, we discussed with  Mr. Kittell that cancer genetics is a rapidly advancing field and it is possible that new genetic tests will be appropriate for him and/or his family members in the future. We encouraged him to remain in contact with cancer genetics on an annual basis so we can update his personal and family histories and let him know of advances in cancer genetics that may benefit this family.   Our contact number was provided. Mr. Cervini questions were answered to his satisfaction, and he knows he is welcome to call us at anytime with additional questions or concerns.   Faith Rogue, MS, [facility] Genetic Counselor [email or web address] Phone: [phone]

## 2020-03-05 ENCOUNTER — Other Ambulatory Visit: Payer: Self-pay

## 2020-03-05 ENCOUNTER — Emergency Department (HOSPITAL_BASED_OUTPATIENT_CLINIC_OR_DEPARTMENT_OTHER): Payer: No Typology Code available for payment source

## 2020-03-05 ENCOUNTER — Emergency Department (HOSPITAL_BASED_OUTPATIENT_CLINIC_OR_DEPARTMENT_OTHER)
Admission: EM | Admit: 2020-03-05 | Discharge: 2020-03-05 | Disposition: A | Discharge status: Home / Self Care | Payer: No Typology Code available for payment source | Attending: Emergency Medicine | Admitting: Emergency Medicine

## 2020-03-05 ENCOUNTER — Encounter (HOSPITAL_BASED_OUTPATIENT_CLINIC_OR_DEPARTMENT_OTHER): Payer: Self-pay

## 2020-03-05 DIAGNOSIS — W19XXXA Unspecified fall, initial encounter: Secondary | ICD-10-CM | POA: Insufficient documentation

## 2020-03-05 DIAGNOSIS — Y92090 Kitchen in other non-institutional residence as the place of occurrence of the external cause: Secondary | ICD-10-CM | POA: Diagnosis not present

## 2020-03-05 DIAGNOSIS — Z96652 Presence of left artificial knee joint: Secondary | ICD-10-CM | POA: Diagnosis not present

## 2020-03-05 DIAGNOSIS — Z8546 Personal history of malignant neoplasm of prostate: Secondary | ICD-10-CM | POA: Diagnosis not present

## 2020-03-05 DIAGNOSIS — S299XXA Unspecified injury of thorax, initial encounter: Secondary | ICD-10-CM | POA: Diagnosis present

## 2020-03-05 DIAGNOSIS — Z79899 Other long term (current) drug therapy: Secondary | ICD-10-CM | POA: Insufficient documentation

## 2020-03-05 DIAGNOSIS — S2242XA Multiple fractures of ribs, left side, initial encounter for closed fracture: Secondary | ICD-10-CM | POA: Insufficient documentation

## 2020-03-05 DIAGNOSIS — S0093XA Contusion of unspecified part of head, initial encounter: Secondary | ICD-10-CM | POA: Insufficient documentation

## 2020-03-05 DIAGNOSIS — Y9389 Activity, other specified: Secondary | ICD-10-CM | POA: Insufficient documentation

## 2020-03-05 DIAGNOSIS — Y998 Other external cause status: Secondary | ICD-10-CM | POA: Insufficient documentation

## 2020-03-05 LAB — BASIC METABOLIC PANEL
Anion gap: 11 (ref 5–15)
BUN: 22 mg/dL (ref 8–23)
CO2: 24 mmol/L (ref 22–32)
Calcium: 9.2 mg/dL (ref 8.9–10.3)
Chloride: 102 mmol/L (ref 98–111)
Creatinine, Ser: 1.01 mg/dL (ref 0.61–1.24)
GFR calc Af Amer: >60 mL/min (ref >60)
GFR calc non Af Amer: >60 mL/min (ref >60)
Glucose, Bld: 110 mg/dL — ABNORMAL HIGH (ref 70–99)
Potassium: 4 mmol/L (ref 3.5–5.1)
Sodium: 137 mmol/L (ref 135–145)

## 2020-03-05 LAB — CBC WITH DIFFERENTIAL/PLATELET
Abs Immature Granulocytes: 0.02 10*3/uL (ref 0.00–0.07)
Basophils Absolute: 0 10*3/uL (ref 0.0–0.1)
Basophils Relative: 0 %
Eosinophils Absolute: 0.1 10*3/uL (ref 0.0–0.5)
Eosinophils Relative: 1 %
HCT: 40.9 % (ref 39.0–52.0)
Hemoglobin: 13.6 g/dL (ref 13.0–17.0)
Immature Granulocytes: 0 %
Lymphocytes Relative: 16 %
Lymphs Abs: 1.1 10*3/uL (ref 0.7–4.0)
MCH: 31.2 pg (ref 26.0–34.0)
MCHC: 33.3 g/dL (ref 30.0–36.0)
MCV: 93.8 fL (ref 80.0–100.0)
Monocytes Absolute: 0.6 10*3/uL (ref 0.1–1.0)
Monocytes Relative: 9 %
Neutro Abs: 5.2 10*3/uL (ref 1.7–7.7)
Neutrophils Relative %: 74 %
Platelets: 222 10*3/uL (ref 150–400)
RBC: 4.36 MIL/uL (ref 4.22–5.81)
RDW: 14.5 % (ref 11.5–15.5)
WBC: 7 10*3/uL (ref 4.0–10.5)
nRBC: 0 % (ref 0.0–0.2)

## 2020-03-05 NOTE — ED Triage Notes (Addendum)
Pt reports a fall after syncopal episode ~11pm yesterday-sent from UC with xray cd was advised "blunting in my lung and I needed my head checked"-NAD-steady gait

## 2020-03-05 NOTE — ED Provider Notes (Signed)
[facility] EMERGENCY DEPARTMENT Provider Note   CSN: [id number] Arrival date & time: 03/05/20  1500     History Chief Complaint  Patient presents with  . Loss of Consciousness    Connor Duffy is a 79 y.o. male.  He is a history of A. fib and pulmonary embolism and is on blood thinners.  He said he fell may be had a syncopal event last evening after taking Ambien and then waking up to get a snack.  Fell in the kitchen.  Does not really recall the fall but got up and went back to bed.  This morning he noticed that the left side of his chest was sore and that he had a bruise on his forehead.  Went to urgent care with a did a chest x-ray that said no rib fractures but saw some blunting and thought he should come here for an evaluation.  He denies headache blurry vision neck or back pain.  No shortness of breath but has pain when taking a deep breath.  No abdominal pain extremity numbness weakness or pain.  The history is provided by the patient.  Fall This is a new problem. The current episode started 12 to 24 hours ago. The problem has not changed since onset.Associated symptoms include chest pain. Pertinent negatives include no abdominal pain, no headaches and no shortness of breath. The symptoms are aggravated by coughing. Nothing relieves the symptoms. He has tried nothing for the symptoms. The treatment provided no relief.       Past Medical History:  Diagnosis Date  . Arthritis   . Atrial fibrillation ([place])   . Cancer ([place])    PROSTATE  . Dysrhythmia   . Family history of breast cancer   . Family history of breast cancer in male   . Family history of colon cancer   . Family history of prostate cancer   . Spinal stenosis     Patient Active Problem List   Diagnosis Date Noted  . Genetic testing 05/30/2019  . Family history of breast cancer   . Family history of prostate cancer   . Family history of colon cancer   . Family history of breast cancer in male   .  Chronic anticoagulation   . History of deep vein thrombosis   . Dysphagia   . Acute lower GI bleeding 05/29/2018  . Overweight (BMI 25.0-29.9) 05/25/2017  . S/P left TKA 05/24/2017  . 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[place])   . Dyspnea 03/14/2015  . Atypical chest pain 03/14/2015  . PAF (paroxysmal atrial fibrillation) ([place]) 03/14/2015  . Arthritis   . Cancer ([place])   . Spinal stenosis   . Atrial fibrillation ([place])   . Degenerative arthritis of hip 07/08/2011    Past Surgical History:  Procedure Laterality Date  . BACK SURGERY     spinal stenosis surgery and herniated disc surgery  . CARDIAC CATHETERIZATION N/A 04/08/2015   Procedure: Left Heart Cath and Coronary Angiography;  Surgeon: Jerline Pain, MD;  Location: [facility];  Service: Cardiovascular;  Laterality: N/A;  . FLEXIBLE SIGMOIDOSCOPY N/A 05/30/2018   Procedure: FLEXIBLE SIGMOIDOSCOPY;  Surgeon: Ronald Lobo, MD;  Location: [facility] ENDOSCOPY;  Service: Endoscopy;  Laterality: N/A;  unprepped, unsedated  . HERNIA REPAIR     inguinal bilateral  . JOINT REPLACEMENT     Left total knee Dr. Alvan Dame 05/24/17  . KNEE ARTHROSCOPY  LEFT   Also right  . PENILE PROSTHESIS IMPLANT    .  PROSTATECTOMY    . TOTAL HIP ARTHROPLASTY  07/08/2011   Procedure: TOTAL HIP ARTHROPLASTY ANTERIOR APPROACH;  Surgeon: Mcarthur Rossetti;  Location: [facility];  Service: Orthopedics;  Laterality: Right;  Right Total Hip Arthroplasty, Direct Anterior Approach   (c-arm)  . TOTAL KNEE ARTHROPLASTY Left 05/24/2017   Procedure: LEFT TOTAL KNEE ARTHROPLASTY;  Surgeon: Paralee Cancel, MD;  Location: [facility];  Service: Orthopedics;  Laterality: Left;  Adductor Block       Family History  Problem Relation Age of Onset  . Cancer - Colon Father        dx 25s  . Breast cancer Father        unsure age of dx  . Breast cancer Sister        dx 35s  . Prostate cancer Brother 70    Social History   Tobacco Use  . Smoking status: Never Smoker  . Smokeless tobacco:  Never Used  Vaping Use  . Vaping Use: Never used  Substance Use Topics  . Alcohol use: No  . Drug use: No    Home Medications Prior to Admission medications   Medication Sig Start Date End Date Taking? Authorizing Provider  abiraterone acetate (ZYTIGA) 250 MG tablet Take 1,000 mg daily by mouth. Take on an empty stomach 1 hour before or 2 hours after a meal    [provider]  carboxymethylcellulose (REFRESH PLUS) 0.5 % SOLN Place 1 drop into both eyes 2 (two) times daily as needed (dry eyes).    [provider]  dabigatran (PRADAXA) 150 MG CAPS capsule Take 150 mg 2 (two) times daily by mouth.    [provider]  diclofenac sodium (VOLTAREN) 1 % GEL Apply 2 g topically 4 (four) times daily as needed (knee pain).    [provider]  diltiazem (CARDIZEM CD) 240 MG 24 hr capsule Take 240 mg by mouth daily.     [provider]  gabapentin (NEURONTIN) 300 MG capsule Take 300 mg at bedtime by mouth.    [provider]  omeprazole (PRILOSEC) 20 MG capsule Take 1 capsule (20 mg total) by mouth daily. 05/31/18   Seawell, Jaimie A, DO  polyethylene glycol (MIRALAX / GLYCOLAX) packet Take 17 g by mouth 2 (two) times daily. Patient not taking: Reported on 05/29/2018 05/25/17   Danae Orleans, PA-C  predniSONE (DELTASONE) 5 MG tablet Take 5 mg daily with breakfast by mouth.    [provider]  zolpidem (AMBIEN) 5 MG tablet Take 5 mg at bedtime by mouth.    [provider]    Allergies    Patient has no known allergies.  Review of Systems   Review of Systems  Constitutional: Negative for fever.  HENT: Negative for sore throat.   Eyes: Negative for visual disturbance.  Respiratory: Negative for shortness of breath.   Cardiovascular: Positive for chest pain.  Gastrointestinal: Negative for abdominal pain.  Genitourinary: Negative for dysuria.  Musculoskeletal: Negative for back pain and neck pain.  Skin: Negative for rash.   Neurological: Negative for headaches.    Physical Exam Updated Vital Signs BP (!) 148/79 (BP Location: Right Arm)   Pulse 67   Temp 98.6 F (37 C) (Oral)   Resp 18   Ht 5\' 11"  (1.803 m)   Wt 86.6 kg   SpO2 100%   BMI 26.64 kg/m   Physical Exam Vitals and nursing note reviewed.  Constitutional:      Appearance: Normal appearance. He is  well-developed.  HENT:     Head: Normocephalic.     Comments: 4 cm bruise on left forehead Eyes:     Conjunctiva/sclera: Conjunctivae normal.  Cardiovascular:     Rate and Rhythm: Normal rate and regular rhythm.     Heart sounds: No murmur heard.   Pulmonary:     Effort: Pulmonary effort is normal. No respiratory distress.     Breath sounds: Normal breath sounds. No wheezing or rhonchi.  Chest:     Chest wall: Tenderness (Left mid lateral ribs) present. No deformity, swelling or crepitus.     Comments: No bruising Abdominal:     Palpations: Abdomen is soft.     Tenderness: There is no abdominal tenderness.  Musculoskeletal:        General: No deformity or signs of injury. Normal range of motion.     Cervical back: Neck supple.  Skin:    General: Skin is warm and dry.     Capillary Refill: Capillary refill takes less than 2 seconds.  Neurological:     General: No focal deficit present.     Mental Status: He is alert.     Sensory: No sensory deficit.     Motor: No weakness.     Gait: Gait normal.     ED Results / Procedures / Treatments   Labs (all labs ordered are listed, but only abnormal results are displayed) Labs Reviewed  BASIC METABOLIC PANEL - Abnormal; Notable for the following components:      Result Value   Glucose, Bld 110 (*)    All other components within normal limits  CBC WITH DIFFERENTIAL/PLATELET    EKG None  Radiology CT Head Wo Contrast  Result Date: 03/05/2020 CLINICAL DATA:  Fall EXAM: CT HEAD WITHOUT CONTRAST TECHNIQUE: Contiguous axial images were obtained from the base of the skull through the  vertex without intravenous contrast. COMPARISON:  None. FINDINGS: Brain: There is no acute intracranial hemorrhage, mass effect, or edema. Gray-Umali differentiation is preserved. There is no extra-axial fluid collection. Ventricles and sulci are within normal limits in size and configuration. Patchy hypoattenuation in the supratentorial Sarli matter is nonspecific but may reflect mild chronic microvascular ischemic changes. Vascular: There is atherosclerotic calcification at the skull base. Skull: Calvarium is unremarkable. Sinuses/Orbits: No acute finding. Other: None. IMPRESSION: No evidence of acute intracranial injury. Electronically Signed   By: Macy Mis M.D.   On: 03/05/2020 16:12   CT Chest Wo Contrast  Result Date: 03/05/2020 CLINICAL DATA:  Fall, syncopal episode, left lower chest pain EXAM: CT CHEST WITHOUT CONTRAST TECHNIQUE: Multidetector CT imaging of the chest was performed following the standard protocol without IV contrast. COMPARISON:  PET-CT 05/08/2018 (report only), esophagram 05/31/2018, chest radiograph 04/04/2015 FINDINGS: Cardiovascular: Limited evaluation the absence of contrast media. Minimal plaque in the normal caliber thoracic aorta. No significant periaortic stranding or hemorrhage. No abnormal hyperdense mural thickening or plaque displacement. Normal 3 vessel branching of the aortic arch. Proximal great vessels are grossly unremarkable. Central pulmonary arteries are normal caliber. Normal heart size. No pericardial effusion. Three-vessel coronary artery disease is noted. Mediastinum/Nodes: No mediastinal fluid or gas. Normal thyroid gland and thoracic inlet. No acute abnormality of the trachea or esophagus. No worrisome mediastinal or axillary adenopathy. Hilar nodal evaluation is limited in the absence of intravenous contrast media. Lungs/Pleura: Atelectatic changes are present in the left lung base and lingula which may reflect some subsegmental atelectasis related to the  splinting/chest pain. No clear acute traumatic abnormality of  the lung parenchyma is seen. No visible effusion or pneumothorax. Few calcified pleural plaques are present, nonspecific. No suspicious pulmonary nodules or masses. Upper Abdomen: Multiple enlarged retroperitoneal and mesenteric nodes are noted including a 15 mm central mesenteric node (7/185) and day 13 mm aortocaval node (7/188). No acute traumatic abnormalities are evident in the upper abdomen within the limitations of this noncontrast CT exam. Musculoskeletal: Conspicuous lucency and cortical step-off noted about the medial head left clavicle which could reflect a minimally displaced fracture. Additional minimally displaced left fifth through seventh ribs laterally as well as nondisplaced eighth and ninth lateral rib fractures as well. Suspect a remote right clavicular fracture as well. Background of degenerative changes in the shoulders and thoracic spine. Lumbar fusion only partially visualized on this exam and incompletely assessed. Moderate bilateral gynecomastia is noted. IMPRESSION: 1. Conspicuous lucency and cortical step-off about the medial head left clavicle which could reflect a minimally displaced fracture. Correlate for point tenderness. 2. Additional displaced left fifth through seventh ribs laterally and nondisplaced left eighth and ninth lateral rib fractures. 3. No clear acute traumatic abnormality of the lung parenchyma. No visible effusion or pneumothorax. 4. Atelectatic changes throughout both lungs, more pronounced in the left lung base, possibly related to pain/splinting. 5. Multiple enlarged nodes seen in the upper abdomen including enlarged retroperitoneal adenopathy. Correlate with patient treatment history is prior PET-CT was performed for prostate cancer and mesenteric adenopathy with nodes in this region reportedly demonstrating some low-grade min folic activity. Pattern of disease could suggest an atypical spread of  patient's known prostate cancer or a lymphoproliferative disorder. 6. Three-vessel coronary artery disease. 7. Few calcified pleural plaques, may reflect prior asbestos related exposure. 8. Aortic Atherosclerosis (ICD10-I70.0). Electronically Signed   By: Lovena Le M.D.   On: 03/05/2020 16:13    Procedures Procedures (including critical care time)  Medications Ordered in ED Medications - No data to display  ED Course  I have reviewed the triage vital signs and the nursing notes.  Pertinent labs & imaging results that were available during my care of the patient were reviewed by me and considered in my medical decision making (see chart for details).  Clinical Course as of Mar 06 1048  Wed Mar 05, 2020  1609 Head CT and chest CT interpreted by me as possible rib fracture left was no acute findings.  Awaiting radiology reading.   [MB]  9528 CT showing 5 rib fractures on the left.  May also comment upon some irregularity in his clavicle.  We examined that area and he has no tenderness.  He said he said multiple clavicle fractures before.  He also was aware of the lymph nodes and he says his doctors have been following that.   [MB]  1713 Discussed with Dr. Georgette Dover trauma attending.  He felt that if the patient's pain was adequately controlled and he looked good he could go home with pain control incentive spirometer ice to the affected area.  Patient and his wife are comfortable with that plan.  He has some leftover narcotic pain medicine from back surgery in February and said he would just use that and does not want [place] to prescribe anything.  Counseled to not take pain medicine and Ambien at the same time.  Dr. Molli Posey also felt that it was okay for the patient to continue his blood thinner as there is no evidence of any hematoma on the CT.   [MB]    Clinical Course User Index [MB] Melina Copa,  Rebeca Alert, MD   MDM Rules/Calculators/A&P                         This patient complains of rib pain  after possible syncopal event or fall; this involves an extensive number of treatment Options and is a complaint that carries with it a high risk of complications and Morbidity. The differential includes pneumothorax, rib fractures, hemothorax, metabolic derangement  I ordered, reviewed and interpreted labs, which included CBC with normal Covino count normal hemoglobin, chemistries normal other than mildly elevated glucose I ordered imaging studies which included CT head CT chest and I independently    visualized and interpreted imaging which showed 5 rib fractures on the left, no pneumothorax no hemothorax Additional history obtained from patient's wife Previous records obtained and reviewed in epic, no recent ED visits I consulted Dr. Georgette Dover trauma attending and discussed lab and imaging findings  Critical Interventions: None  After the interventions stated above, I reevaluated the patient and found patient to be hemodynamically stable satting well on room air.  Does not appear to be in any distress.  Reviewed recommendations from trauma with patient and his wife and they are comfortable with plan of home, incentive spirometer, pain control and close follow-up.  Return instructions discussed   Final Clinical Impression(s) / ED Diagnoses Final diagnoses:  Fall, initial encounter  Closed fracture of multiple ribs of left side, initial encounter  Contusion of head, unspecified part of head, initial encounter    Rx / DC Orders ED Discharge Orders    None       Hayden Rasmussen, MD 03/06/20 1051

## 2020-03-05 NOTE — Discharge Instructions (Addendum)
You were seen in the emergency department for evaluation of injuries after a fall or fainting episode.  Your blood work showed that you were not anemic and your kidney function was good.  Your CAT scan of your head did not show any serious findings.  Your CAT scan of your chest showed 5 rib fractures along with some other incidental findings.  We reviewed this with Dr. Deirdre Pippins trauma attending and he felt it was appropriate for you to treat this at home.  Use your incentive spirometer every hour while awake.  Tylenol for pain.  Your prior narcotic pain medicine for breakthrough pain.  Hold your Ambien if you are taking narcotic pain medicine.  If any worsening shortness of breath or other concerning symptoms.

## 2020-05-07 ENCOUNTER — Ambulatory Visit (INDEPENDENT_AMBULATORY_CARE_PROVIDER_SITE_OTHER): Payer: Medicare HMO | Admitting: Orthopaedic Surgery

## 2020-05-07 ENCOUNTER — Ambulatory Visit (INDEPENDENT_AMBULATORY_CARE_PROVIDER_SITE_OTHER): Payer: Medicare HMO

## 2020-05-07 VITALS — Ht 71.0 in | Wt 193.0 lb

## 2020-05-07 DIAGNOSIS — M25552 Pain in left hip: Secondary | ICD-10-CM

## 2020-05-07 NOTE — Progress Notes (Signed)
Office Visit Note   Patient: Connor Duffy           Date of Birth: 09-Jan-1941           MRN: [id number] Visit Date: 05/07/2020              Requested by: Lindell Noe [address] [place],  [place] [id number] PCP: Clinic, [place]: Visit Diagnoses:  1. Pain in left hip     Plan: Given the fact that his hip pain is minimal, I did not recommend a steroid injection today.  However, I did show him stretching exercises that I want him to try twice daily 3 sets each.  He demonstrated the ability to do do these exercises.  I will also have him try Voltaren gel over the trochanteric area and the proximal IT band on the left side twice daily.  If this worsens in any way he will come back to see me to consider steroid injection of the right now I would recommend just this treatment.  They agree with this treatment plan.  All question concerns were answered and addressed.  Follow-up as otherwise as needed.  Follow-Up Instructions: Return if symptoms worsen or fail to improve.   Orders:  Orders Placed This Encounter  Procedures  . XR HIP UNILAT W OR W/O PELVIS 1V LEFT   No orders of the defined types were placed in this encounter.     Procedures: No procedures performed   Clinical Data: No additional findings.   Subjective: Chief Complaint  Patient presents with  . Left Hip - Pain  The patient is very well-known to me.  We replaced his right hip through an anterior approach in 2013.  He says that hip is done very well and he has no issues at all.  His neurosurgeon wanted [place] to take a look at his left hip to make sure there is no left hip issue.  He has a history of several back surgeries.  He does get some left hip pain but is more in his buttocks and on the lateral aspect of his hip.  Denies any groin pain at all.  He does not walk with an assistive device.  Apparently he may have more spine surgery coming up in the future.  It is  appropriate to see him today to make sure that the hip is not a issue or to consider potentially even a steroid injection if needed.  He has had no other acute change in medical status.  He is not a diabetic.  His wife is with him today as well.  HPI  Review of Systems He currently denies any headache, chest pain, shortness of breath, fever, chills, nausea, vomiting  Objective: Vital Signs: Ht 5\' 11"  (1.803 m)   Wt 193 lb (87.5 kg)   BMI 26.92 kg/m   Physical Exam He is alert and orient x3 and in no acute distress Ortho Exam Examination of his hip on the left side shows it moves smoothly and fluidly with no pain in the groin at all.  When I have him lay in a lateral position with his left hip up there is minimal pain to palpation of the trochanteric area and the IT band. Specialty Comments:  No specialty comments available.  Imaging: XR HIP UNILAT W OR W/O PELVIS 1V LEFT  Result Date: 05/07/2020 An AP pelvis and lateral left hip shows a well-seated total hip arthroplasty on the right  side.  The left hip still has a well-maintained joint space with no significant arthritic findings.    PMFS History: Patient Active Problem List   Diagnosis Date Noted  . Genetic testing 05/30/2019  . Family history of breast cancer   . Family history of prostate cancer   . Family history of colon cancer   . Family history of breast cancer in male   . Chronic anticoagulation   . History of deep vein thrombosis   . Dysphagia   . Acute lower GI bleeding 05/29/2018  . Overweight (BMI 25.0-29.9) 05/25/2017  . S/P left TKA 05/24/2017  . 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[place])   . Dyspnea 03/14/2015  . Atypical chest pain 03/14/2015  . PAF (paroxysmal atrial fibrillation) ([place]) 03/14/2015  . Arthritis   . Cancer ([place])   . Spinal stenosis   . Atrial fibrillation ([place])   . Degenerative arthritis of hip 07/08/2011   Past Medical History:  Diagnosis Date  . Arthritis   . Atrial fibrillation ([place])   . Cancer  ([place])    PROSTATE  . Dysrhythmia   . Family history of breast cancer   . Family history of breast cancer in male   . Family history of colon cancer   . Family history of prostate cancer   . Spinal stenosis     Family History  Problem Relation Age of Onset  . Cancer - Colon Father        dx 36s  . Breast cancer Father        unsure age of dx  . Breast cancer Sister        dx 2s  . Prostate cancer Brother 48    Past Surgical History:  Procedure Laterality Date  . BACK SURGERY     spinal stenosis surgery and herniated disc surgery  . CARDIAC CATHETERIZATION N/A 04/08/2015   Procedure: Left Heart Cath and Coronary Angiography;  Surgeon: Jerline Pain, MD;  Location: [facility];  Service: Cardiovascular;  Laterality: N/A;  . FLEXIBLE SIGMOIDOSCOPY N/A 05/30/2018   Procedure: FLEXIBLE SIGMOIDOSCOPY;  Surgeon: Ronald Lobo, MD;  Location: [facility] ENDOSCOPY;  Service: Endoscopy;  Laterality: N/A;  unprepped, unsedated  . HERNIA REPAIR     inguinal bilateral  . JOINT REPLACEMENT     Left total knee Dr. Alvan Dame 05/24/17  . KNEE ARTHROSCOPY  LEFT   Also right  . PENILE PROSTHESIS IMPLANT    . PROSTATECTOMY    . TOTAL HIP ARTHROPLASTY  07/08/2011   Procedure: TOTAL HIP ARTHROPLASTY ANTERIOR APPROACH;  Surgeon: Mcarthur Rossetti;  Location: [facility];  Service: Orthopedics;  Laterality: Right;  Right Total Hip Arthroplasty, Direct Anterior Approach   (c-arm)  . TOTAL KNEE ARTHROPLASTY Left 05/24/2017   Procedure: LEFT TOTAL KNEE ARTHROPLASTY;  Surgeon: Paralee Cancel, MD;  Location: [facility];  Service: Orthopedics;  Laterality: Left;  Adductor Block   Social History   Occupational History  . Not on file  Tobacco Use  . Smoking status: Never Smoker  . Smokeless tobacco: Never Used  Vaping Use  . Vaping Use: Never used  Substance and Sexual Activity  . Alcohol use: No  . Drug use: No  . Sexual activity: Not on file

## 2021-03-26 ENCOUNTER — Other Ambulatory Visit: Payer: Self-pay

## 2021-03-26 ENCOUNTER — Institutional Professional Consult (permissible substitution) (INDEPENDENT_AMBULATORY_CARE_PROVIDER_SITE_OTHER): Payer: No Typology Code available for payment source | Admitting: Thoracic Surgery (Cardiothoracic Vascular Surgery)

## 2021-03-26 ENCOUNTER — Encounter: Payer: Self-pay | Admitting: Thoracic Surgery (Cardiothoracic Vascular Surgery)

## 2021-03-26 VITALS — BP 138/69 | HR 100 | Resp 20 | Ht 71.0 in | Wt 193.0 lb

## 2021-03-26 DIAGNOSIS — I2510 Atherosclerotic heart disease of native coronary artery without angina pectoris: Secondary | ICD-10-CM

## 2021-03-26 DIAGNOSIS — I25119 Atherosclerotic heart disease of native coronary artery with unspecified angina pectoris: Secondary | ICD-10-CM

## 2021-03-26 DIAGNOSIS — I4891 Unspecified atrial fibrillation: Secondary | ICD-10-CM

## 2021-03-26 NOTE — H&P (View-Only) (Signed)
[facility] Referring Provider is [facility]  Chief Complaint  Patient presents with   Coronary Artery Disease    Surgical consult, Left Cardiac Cath 03/04/21, ECHO 08/03/18, Nuclear stress test 02/18/21    HPI: Connor Duffy is sent for consultation regarding three-vessel coronary disease.  Connor Duffy is a 80 year old man with a past medical history significant for atrial fibrillation, DVT, hypertension, prostate cancer, arthritis, left total knee replacement, hyperlipidemia, and newly diagnosed coronary disease.  He had an ablation for A. fib in May 2020.  Recently he has been experiencing chest tightness and palpitations with exertion.  He works at [employer] about 5 days a week.  He mainly does elliptical or bike work because of knee issues.  Recently about 5 minutes into his workout he will experience tightness in his chest and shortness of breath.  He also feels like his heart is going to beat out of his chest.  He thought he was having episodes of atrial fibrillation.  He had a nuclear stress test which showed reversible ischemia of the large size in the mid to distal anterior, anteroseptal, and apical walls.  Ejection fraction was 60%.  He then underwent cardiac catheterization where he was found to have chronic total occlusion of his LAD, 75% stenosis in the mid circumflex, and diffuse disease in the right coronary.  He now is referred for coronary artery bypass grafting.  Past Medical History:  Diagnosis Date   Arthritis    Atrial fibrillation ([place])    Cancer ([place])    PROSTATE   Dysrhythmia    Family history of breast cancer    Family history of breast cancer in male    Family history of colon cancer    Family history of prostate cancer    Spinal stenosis     Past Surgical History:  Procedure Laterality Date   BACK SURGERY     spinal stenosis surgery and herniated disc surgery   CARDIAC CATHETERIZATION N/A 04/08/2015   Procedure: Left Heart Cath and  Coronary Angiography;  Surgeon: Jerline Pain, MD;  Location: [place];  Service: Cardiovascular;  Laterality: N/A;   FLEXIBLE SIGMOIDOSCOPY N/A 05/30/2018   Procedure: FLEXIBLE SIGMOIDOSCOPY;  Surgeon: Ronald Lobo, MD;  Location: [facility] ENDOS[facility];  Service: Endoscopy;  Laterality: N/A;  unprepped, unsedated   HERNIA REPAIR     inguinal bilateral   JOINT REPLACEMENT     Left total knee Dr. Alvan Dame 05/24/17   KNEE ARTHROSCOPY  LEFT   Also right   PENILE PROSTHESIS IMPLANT     PROSTATECTOMY     TOTAL HIP ARTHROPLASTY  07/08/2011   Procedure: TOTAL HIP ARTHROPLASTY ANTERIOR APPROACH;  Surgeon: Mcarthur Rossetti;  Location: [facility];  Service: Orthopedics;  Laterality: Right;  Right Total Hip Arthroplasty, Direct Anterior Approach   (c-arm)   TOTAL KNEE ARTHROPLASTY Left 05/24/2017   Procedure: LEFT TOTAL KNEE ARTHROPLASTY;  Surgeon: Paralee Cancel, MD;  Location: [facility];  Service: Orthopedics;  Laterality: Left;  Adductor Block    Family History  Problem Relation Age of Onset   Cancer - Colon Father        dx 33s   Breast cancer Father        unsure age of dx   Breast cancer Sister        dx 25s   Prostate cancer Brother 76    Social History Social History   Tobacco Use   Smoking status: Never   Smokeless  tobacco: Never  Vaping Use   Vaping Use: Never used  Substance Use Topics   Alcohol use: No   Drug use: No    Current Outpatient Medications  Medication Sig Dispense Refill   atorvastatin (LIPITOR) 40 MG tablet Take 40 mg by mouth daily.     dabigatran (PRADAXA) 150 MG CAPS capsule Take 150 mg 2 (two) times daily by mouth.     DAROLUTAMIDE PO Take 1,000 mg by mouth daily.     diclofenac sodium (VOLTAREN) 1 % GEL Apply 2 g topically 4 (four) times daily as needed (knee pain).     diltiazem (CARDIZEM CD) 240 MG 24 hr capsule Take 240 mg by mouth daily.      gabapentin (NEURONTIN) 300 MG capsule Take 300 mg at bedtime by mouth.     omeprazole (PRILOSEC) 20 MG  capsule Take 1 capsule (20 mg total) by mouth daily. 30 capsule 1   predniSONE (DELTASONE) 5 MG tablet Take 5 mg daily with breakfast by mouth.     zolpidem (AMBIEN) 5 MG tablet Take 5 mg at bedtime by mouth.     abiraterone acetate (ZYTIGA) 250 MG tablet Take 1,000 mg daily by mouth. Take on an empty stomach 1 hour before or 2 hours after a meal     carboxymethylcellulose (REFRESH PLUS) 0.5 % SOLN Place 1 drop into both eyes 2 (two) times daily as needed (dry eyes).     polyethylene glycol (MIRALAX / GLYCOLAX) packet Take 17 g by mouth 2 (two) times daily. (Patient not taking: No sig reported) 14 each 0   No current facility-administered medications for this visit.    Allergies  Allergen Reactions   Protective Adhesive Powder Dermatitis    SILK TAPE caused blisters, bleeding, and open wounds.   Wound Dressing Adhesive Dermatitis    SILK TAPE caused blisters, bleeding, and open wounds.    Review of Systems  Constitutional:  Positive for activity change and fatigue. Negative for unexpected weight change.  HENT:  Negative for trouble swallowing and voice change.   Respiratory:  Positive for shortness of breath.   Cardiovascular:  Positive for chest pain and palpitations. Negative for leg swelling.  Gastrointestinal:  Positive for constipation.  Genitourinary:  Positive for frequency and urgency.       Penile implant  Musculoskeletal:  Positive for arthralgias, gait problem and joint swelling.  Neurological:  Negative for syncope and weakness.  Hematological:  Bruises/bleeds easily.   BP 138/69   Pulse 100   Resp 20   Ht 5\' 11"  (1.803 m)   Wt 193 lb (87.5 kg)   SpO2 98% Comment: RA  BMI 26.92 kg/m  Physical Exam Vitals reviewed.  Constitutional:      General: He is not in acute distress.    Appearance: Normal appearance.  HENT:     Head: Normocephalic and atraumatic.  Eyes:     General: No scleral icterus.    Extraocular Movements: Extraocular movements intact.  Neck:      Vascular: No carotid bruit.  Cardiovascular:     Rate and Rhythm: Normal rate and regular rhythm.     Heart sounds: Normal heart sounds. No murmur heard. Pulmonary:     Effort: Pulmonary effort is normal. No respiratory distress.     Breath sounds: Normal breath sounds. No wheezing or rales.  Abdominal:     General: There is no distension.     Palpations: Abdomen is soft.  Musculoskeletal:     Cervical back:  Neck supple.  Lymphadenopathy:     Cervical: No cervical adenopathy.  Skin:    General: Skin is warm and dry.  Neurological:     General: No focal deficit present.     Mental Status: He is alert and oriented to person, place, and time.     Cranial Nerves: No cranial nerve deficit.     Motor: No weakness.     Gait: Gait normal.    Diagnostic Tests: I personally reviewed the catheterization images done at the [facility]. There is severe three-vessel disease with chronic total occlusion of the LAD which fills faintly via small right to left collaterals.  He does have good targets for grafting.  Impression: Connor Duffy is a 80 year old man with a past medical history significant for atrial fibrillation, DVT, hypertension, prostate cancer, arthritis, left total knee replacement, hyperlipidemia, and newly diagnosed coronary disease.    He presents with exertional angina.  Work-up included nuclear stress test which showed reversible ischemia in the anterior wall and apex.  Catheterization showed severe three-vessel disease with a total occlusion of the LAD.  EF was 60% on his nuclear test.  He has a history of paroxysmal atrial fibrillation.  A recent monitor showed 3% AF burden.  He has had a previous ablation.  I will think Maze procedure is indicated but I did discuss placing a left atrial appendage clip at the time of surgery.  That will add minimally to the operative time.  If he were to ever need to stop his blood thinners it would decrease his risk for having a stroke.  I  discussed the proposed procedure of coronary artery bypass grafting and left atrial appendage clip with Mr. and Mrs. Florio.  I informed him of the general nature of the procedure including the need for general anesthesia, the incisions to be used, the use of cardiopulmonary bypass, the use of drainage tubes and pacing wires postoperatively.  We discussed the expected hospital stay and overall recovery.  I informed them of the indications, risks, benefits, and alternatives.  They understand the risks include, but are not limited to death, MI, DVT, PE, bleeding, possible need for transfusion, infection, cardiac arrhythmias, respiratory or renal failure, as well as possibility of other unforeseeable complications.  He understands accepts the risks and wishes to proceed as soon as possible.   Plan: Coronary artery bypass grafting and left atrial appendage clip on Monday, 03/30/2021  Melrose Nakayama, MD [facility] [phone]

## 2021-03-26 NOTE — Progress Notes (Signed)
[facility] Provider [facility]  Chief Complaint  Patient presents with   Coronary Artery Disease    Surgical consult, Left Cardiac Cath 03/04/21, ECHO 08/03/18, Nuclear stress test 02/18/21    HPI: Connor Duffy is sent for consultation regarding three-vessel coronary disease.  Connor Duffy is a 80 year old man with a past medical history significant for atrial fibrillation, DVT, hypertension, prostate cancer, arthritis, left total knee replacement, hyperlipidemia, and newly diagnosed coronary disease.  He had an ablation for A. fib in May 2020.  Recently he has been experiencing chest tightness and palpitations with exertion.  He works at [employer] about 5 days a week.  He mainly does elliptical or bike work because of knee issues.  Recently about 5 minutes into his workout he will experience tightness in his chest and shortness of breath.  He also feels like his heart is going to beat out of his chest.  He thought he was having episodes of atrial fibrillation.  He had a nuclear stress test which showed reversible ischemia of the large size in the mid to distal anterior, anteroseptal, and apical walls.  Ejection fraction was 60%.  He then underwent cardiac catheterization where he was found to have chronic total occlusion of his LAD, 75% stenosis in the mid circumflex, and diffuse disease in the right coronary.  He now is referred for coronary artery bypass grafting.  Past Medical History:  Diagnosis Date   Arthritis    Atrial fibrillation ([place])    Cancer ([place])    PROSTATE   Dysrhythmia    Family history of breast cancer    Family history of breast cancer in male    Family history of colon cancer    Family history of prostate cancer    Spinal stenosis     Past Surgical History:  Procedure Laterality Date   BACK SURGERY     spinal stenosis surgery and herniated disc surgery   CARDIAC CATHETERIZATION N/A 04/08/2015   Procedure: Left Heart Cath and  Coronary Angiography;  Surgeon: Jerline Pain, MD;  Location: [place] [facility];  Service: Cardiovascular;  Laterality: N/A;   FLEXIBLE SIGMOIDOSCOPY N/A 05/30/2018   Procedure: FLEXIBLE SIGMOIDOSCOPY;  Surgeon: Ronald Lobo, MD;  Location: [facility] ENDOSCOPY;  Service: Endoscopy;  Laterality: N/A;  unprepped, unsedated   HERNIA REPAIR     inguinal bilateral   JOINT REPLACEMENT     Left total knee Dr. Alvan Dame 05/24/17   KNEE ARTHROSCOPY  LEFT   Also right   PENILE PROSTHESIS IMPLANT     PROSTATECTOMY     TOTAL HIP ARTHROPLASTY  07/08/2011   Procedure: TOTAL HIP ARTHROPLASTY ANTERIOR APPROACH;  Surgeon: Mcarthur Rossetti;  [facility];  Service: Orthopedics;  Laterality: Right;  Right Total Hip Arthroplasty, Direct Anterior Approach   (c-arm)   TOTAL KNEE ARTHROPLASTY Left 05/24/2017   Procedure: LEFT TOTAL KNEE ARTHROPLASTY;  Surgeon: Paralee Cancel, MD;  Location: [facility];  Service: Orthopedics;  Laterality: Left;  Adductor Block    Family History  Problem Relation Age of Onset   Cancer - Colon Father        dx 69s   Breast cancer Father        unsure age of dx   Breast cancer Sister        dx 63s   Prostate cancer Brother 39    Social History Social History   Tobacco Use   Smoking status: Never   Smokeless  tobacco: Never  Vaping Use   Vaping Use: Never used  Substance Use Topics   Alcohol use: No   Drug use: No    Current Outpatient Medications  Medication Sig Dispense Refill   atorvastatin (LIPITOR) 40 MG tablet Take 40 mg by mouth daily.     dabigatran (PRADAXA) 150 MG CAPS capsule Take 150 mg 2 (two) times daily by mouth.     DAROLUTAMIDE PO Take 1,000 mg by mouth daily.     diclofenac sodium (VOLTAREN) 1 % GEL Apply 2 g topically 4 (four) times daily as needed (knee pain).     diltiazem (CARDIZEM CD) 240 MG 24 hr capsule Take 240 mg by mouth daily.      gabapentin (NEURONTIN) 300 MG capsule Take 300 mg at bedtime by mouth.     omeprazole (PRILOSEC) 20 MG  capsule Take 1 capsule (20 mg total) by mouth daily. 30 capsule 1   predniSONE (DELTASONE) 5 MG tablet Take 5 mg daily with breakfast by mouth.     zolpidem (AMBIEN) 5 MG tablet Take 5 mg at bedtime by mouth.     abiraterone acetate (ZYTIGA) 250 MG tablet Take 1,000 mg daily by mouth. Take on an empty stomach 1 hour before or 2 hours after a meal     carboxymethylcellulose (REFRESH PLUS) 0.5 % SOLN Place 1 drop into both eyes 2 (two) times daily as needed (dry eyes).     polyethylene glycol (MIRALAX / GLYCOLAX) packet Take 17 g by mouth 2 (two) times daily. (Patient not taking: No sig reported) 14 each 0   No current facility-administered medications for this visit.    Allergies  Allergen Reactions   Protective Adhesive Powder Dermatitis    SILK TAPE caused blisters, bleeding, and open wounds.   Wound Dressing Adhesive Dermatitis    SILK TAPE caused blisters, bleeding, and open wounds.    Review of Systems  Constitutional:  Positive for activity change and fatigue. Negative for unexpected weight change.  HENT:  Negative for trouble swallowing and voice change.   Respiratory:  Positive for shortness of breath.   Cardiovascular:  Positive for chest pain and palpitations. Negative for leg swelling.  Gastrointestinal:  Positive for constipation.  Genitourinary:  Positive for frequency and urgency.       Penile implant  Musculoskeletal:  Positive for arthralgias, gait problem and joint swelling.  Neurological:  Negative for syncope and weakness.  Hematological:  Bruises/bleeds easily.   BP 138/69   Pulse 100   Resp 20   Ht 5\' 11"  (1.803 m)   Wt 193 lb (87.5 kg)   SpO2 98% Comment: RA  BMI 26.92 kg/m  Physical Exam Vitals reviewed.  Constitutional:      General: He is not in acute distress.    Appearance: Normal appearance.  HENT:     Head: Normocephalic and atraumatic.  Eyes:     General: No scleral icterus.    Extraocular Movements: Extraocular movements intact.  Neck:      Vascular: No carotid bruit.  Cardiovascular:     Rate and Rhythm: Normal rate and regular rhythm.     Heart sounds: Normal heart sounds. No murmur heard. Pulmonary:     Effort: Pulmonary effort is normal. No respiratory distress.     Breath sounds: Normal breath sounds. No wheezing or rales.  Abdominal:     General: There is no distension.     Palpations: Abdomen is soft.  Musculoskeletal:     Cervical back:  Neck supple.  Lymphadenopathy:     Cervical: No cervical adenopathy.  Skin:    General: Skin is warm and dry.  Neurological:     General: No focal deficit present.     Mental Status: He is alert and oriented to person, place, and time.     Cranial Nerves: No cranial nerve deficit.     Motor: No weakness.     Gait: Gait normal.    Diagnostic Tests: I personally reviewed the catheterization images done at the [facility]. There is severe three-vessel disease with chronic total occlusion of the LAD which fills faintly via small right to left collaterals.  He does have good targets for grafting.  Impression: Connor Duffy is a 80 year old man with a past medical history significant for atrial fibrillation, DVT, hypertension, prostate cancer, arthritis, left total knee replacement, hyperlipidemia, and newly diagnosed coronary disease.    He presents with exertional angina.  Work-up included nuclear stress test which showed reversible ischemia in the anterior wall and apex.  Catheterization showed severe three-vessel disease with a total occlusion of the LAD.  EF was 60% on his nuclear test.  He has a history of paroxysmal atrial fibrillation.  A recent monitor showed 3% AF burden.  He has had a previous ablation.  I will think Maze procedure is indicated but I did discuss placing a left atrial appendage clip at the time of surgery.  That will add minimally to the operative time.  If he were to ever need to stop his blood thinners it would decrease his risk for having a stroke.  I  discussed the proposed procedure of coronary artery bypass grafting and left atrial appendage clip with Mr. and Mrs. Strole.  I informed him of the general nature of the procedure including the need for general anesthesia, the incisions to be used, the use of cardiopulmonary bypass, the use of drainage tubes and pacing wires postoperatively.  We discussed the expected hospital stay and overall recovery.  I informed them of the indications, risks, benefits, and alternatives.  They understand the risks include, but are not limited to death, MI, DVT, PE, bleeding, possible need for transfusion, infection, cardiac arrhythmias, respiratory or renal failure, as well as possibility of other unforeseeable complications.  He understands accepts the risks and wishes to proceed as soon as possible.   Plan: Coronary artery bypass grafting and left atrial appendage clip on Monday, 03/30/2021  Melrose Nakayama, MD [facility] [phone]

## 2021-03-26 NOTE — Patient Instructions (Signed)
Hold Pradaxa for 3 days prior to surgery

## 2021-03-27 ENCOUNTER — Other Ambulatory Visit: Payer: Self-pay

## 2021-03-27 ENCOUNTER — Other Ambulatory Visit: Payer: Self-pay | Admitting: *Deleted

## 2021-03-27 ENCOUNTER — Encounter (HOSPITAL_COMMUNITY): Payer: Self-pay

## 2021-03-27 ENCOUNTER — Ambulatory Visit (HOSPITAL_COMMUNITY)
Admission: RE | Admit: 2021-03-27 | Discharge: 2021-03-27 | Disposition: A | Discharge status: Home / Self Care | Payer: No Typology Code available for payment source | Source: Ambulatory Visit | Attending: Thoracic Surgery (Cardiothoracic Vascular Surgery) | Admitting: Thoracic Surgery (Cardiothoracic Vascular Surgery)

## 2021-03-27 ENCOUNTER — Other Ambulatory Visit (HOSPITAL_COMMUNITY): Payer: Self-pay | Admitting: Thoracic Surgery (Cardiothoracic Vascular Surgery)

## 2021-03-27 ENCOUNTER — Encounter (HOSPITAL_COMMUNITY)
Admission: RE | Admit: 2021-03-27 | Discharge: 2021-03-27 | Disposition: A | Discharge status: Home / Self Care | Payer: No Typology Code available for payment source | Source: Ambulatory Visit | Attending: Thoracic Surgery (Cardiothoracic Vascular Surgery) | Admitting: Thoracic Surgery (Cardiothoracic Vascular Surgery)

## 2021-03-27 ENCOUNTER — Ambulatory Visit (HOSPITAL_BASED_OUTPATIENT_CLINIC_OR_DEPARTMENT_OTHER)
Admission: RE | Admit: 2021-03-27 | Discharge: 2021-03-27 | Disposition: A | Discharge status: Home / Self Care | Payer: No Typology Code available for payment source | Source: Ambulatory Visit | Attending: Thoracic Surgery (Cardiothoracic Vascular Surgery) | Admitting: Thoracic Surgery (Cardiothoracic Vascular Surgery)

## 2021-03-27 DIAGNOSIS — I4891 Unspecified atrial fibrillation: Secondary | ICD-10-CM | POA: Insufficient documentation

## 2021-03-27 DIAGNOSIS — I25119 Atherosclerotic heart disease of native coronary artery with unspecified angina pectoris: Secondary | ICD-10-CM | POA: Insufficient documentation

## 2021-03-27 DIAGNOSIS — Z20822 Contact with and (suspected) exposure to covid-19: Secondary | ICD-10-CM | POA: Insufficient documentation

## 2021-03-27 DIAGNOSIS — Z01818 Encounter for other preprocedural examination: Secondary | ICD-10-CM

## 2021-03-27 DIAGNOSIS — Z951 Presence of aortocoronary bypass graft: Secondary | ICD-10-CM | POA: Insufficient documentation

## 2021-03-27 HISTORY — DX: Acute embolism and thrombosis of unspecified deep veins of unspecified lower extremity: I82.409

## 2021-03-27 HISTORY — DX: Dyspnea, unspecified: R06.00

## 2021-03-27 HISTORY — DX: Sleep apnea, unspecified: G47.30

## 2021-03-27 HISTORY — DX: Atherosclerotic heart disease of native coronary artery without angina pectoris: I25.10

## 2021-03-27 LAB — APTT: aPTT: 46 seconds — ABNORMAL HIGH (ref 24–36)

## 2021-03-27 LAB — BLOOD GAS, ARTERIAL
Acid-base deficit: 3.6 mmol/L — ABNORMAL HIGH (ref 0.0–2.0)
Bicarbonate: 20.3 mmol/L (ref 20.0–28.0)
Drawn by: 39870
FIO2: 21
O2 Saturation: 98.7 %
Patient temperature: 37
pCO2 arterial: 33.3 mmHg (ref 32.0–48.0)
pH, Arterial: 7.402 (ref 7.350–7.450)
pO2, Arterial: 119 mmHg — ABNORMAL HIGH (ref 83.0–108.0)

## 2021-03-27 LAB — URINALYSIS, ROUTINE W REFLEX MICROSCOPIC
Bacteria, UA: NONE SEEN
Bilirubin Urine: NEGATIVE
Glucose, UA: NEGATIVE mg/dL
Ketones, ur: NEGATIVE mg/dL
Leukocytes,Ua: NEGATIVE
Nitrite: NEGATIVE
Protein, ur: NEGATIVE mg/dL
Specific Gravity, Urine: 1.018 (ref 1.005–1.030)
pH: 5 (ref 5.0–8.0)

## 2021-03-27 LAB — CBC
HCT: 39.4 % (ref 39.0–52.0)
Hemoglobin: 13.3 g/dL (ref 13.0–17.0)
MCH: 31.7 pg (ref 26.0–34.0)
MCHC: 33.8 g/dL (ref 30.0–36.0)
MCV: 93.8 fL (ref 80.0–100.0)
Platelets: 206 10*3/uL (ref 150–400)
RBC: 4.2 MIL/uL — ABNORMAL LOW (ref 4.22–5.81)
RDW: 13.5 % (ref 11.5–15.5)
WBC: 6.1 10*3/uL (ref 4.0–10.5)
nRBC: 0 % (ref 0.0–0.2)

## 2021-03-27 LAB — COMPREHENSIVE METABOLIC PANEL
ALT: 17 U/L (ref 0–44)
AST: 24 U/L (ref 15–41)
Albumin: 3.7 g/dL (ref 3.5–5.0)
Alkaline Phosphatase: 71 U/L (ref 38–126)
Anion gap: 7 (ref 5–15)
BUN: 21 mg/dL (ref 8–23)
CO2: 23 mmol/L (ref 22–32)
Calcium: 8.6 mg/dL — ABNORMAL LOW (ref 8.9–10.3)
Chloride: 105 mmol/L (ref 98–111)
Creatinine, Ser: 1.1 mg/dL (ref 0.61–1.24)
GFR, Estimated: >60 mL/min (ref >60)
Glucose, Bld: 94 mg/dL (ref 70–99)
Potassium: 3.9 mmol/L (ref 3.5–5.1)
Sodium: 135 mmol/L (ref 135–145)
Total Bilirubin: 1.3 mg/dL — ABNORMAL HIGH (ref 0.3–1.2)
Total Protein: 6.5 g/dL (ref 6.5–8.1)

## 2021-03-27 LAB — SARS CORONAVIRUS 2 (TAT 6-24 HRS): SARS Coronavirus 2: NEGATIVE

## 2021-03-27 LAB — SURGICAL PCR SCREEN
MRSA, PCR: NEGATIVE
Staphylococcus aureus: NEGATIVE

## 2021-03-27 LAB — PROTIME-INR
INR: 1.2 (ref 0.8–1.2)
Prothrombin Time: 14.7 seconds (ref 11.4–15.2)

## 2021-03-27 LAB — HEMOGLOBIN A1C
Hgb A1c MFr Bld: 6 % — ABNORMAL HIGH (ref 4.8–5.6)
Mean Plasma Glucose: 125.5 mg/dL

## 2021-03-27 MED ORDER — HEPARIN 30,000 UNITS/1000 ML (OHS) CELLSAVER SOLUTION
Status: DC
  Filled 2021-03-27: qty 1000

## 2021-03-27 MED ORDER — TRANEXAMIC ACID (OHS) PUMP PRIME SOLUTION
2.0000 mg/kg | INTRAVENOUS | Status: DC
  Filled 2021-03-27: qty 1.74

## 2021-03-27 MED ORDER — EPINEPHRINE HCL 5 MG/250ML IV SOLN IN NS
0.0000 ug/min | INTRAVENOUS | Status: DC
  Filled 2021-03-27: qty 250

## 2021-03-27 MED ORDER — POTASSIUM CHLORIDE 2 MEQ/ML IV SOLN
80.0000 meq | INTRAVENOUS | Status: DC
  Filled 2021-03-27: qty 40

## 2021-03-27 MED ORDER — MILRINONE LACTATE IN DEXTROSE 20-5 MG/100ML-% IV SOLN
0.3000 ug/kg/min | INTRAVENOUS | Status: DC
  Filled 2021-03-27: qty 100

## 2021-03-27 MED ORDER — NOREPINEPHRINE 4 MG/250ML-% IV SOLN
0.0000 ug/min | INTRAVENOUS | Status: DC
Start: 2021-03-30 — End: 2021-03-30
  Filled 2021-03-27: qty 250

## 2021-03-27 MED ORDER — NITROGLYCERIN IN D5W 200-5 MCG/ML-% IV SOLN
2.0000 ug/min | INTRAVENOUS | Status: DC
  Filled 2021-03-27: qty 250

## 2021-03-27 MED ORDER — PLASMA-LYTE A IV SOLN
INTRAVENOUS | Status: DC
  Filled 2021-03-27: qty 5

## 2021-03-27 MED ORDER — VANCOMYCIN HCL 1500 MG/300ML IV SOLN
1500.0000 mg | INTRAVENOUS | Status: AC
  Administered 2021-03-30: 1500 mg via INTRAVENOUS
  Filled 2021-03-27: qty 300

## 2021-03-27 MED ORDER — TRANEXAMIC ACID 1000 MG/10ML IV SOLN
1.5000 mg/kg/h | INTRAVENOUS | Status: AC
  Administered 2021-03-30: 1.5 mg/kg/h via INTRAVENOUS
  Filled 2021-03-27: qty 25

## 2021-03-27 MED ORDER — TRANEXAMIC ACID (OHS) BOLUS VIA INFUSION
15.0000 mg/kg | INTRAVENOUS | Status: AC
  Administered 2021-03-30: 1308 mg via INTRAVENOUS
  Filled 2021-03-27: qty 1308

## 2021-03-27 MED ORDER — INSULIN REGULAR(HUMAN) IN NACL 100-0.9 UT/100ML-% IV SOLN
INTRAVENOUS | Status: AC
  Administered 2021-03-30: 1 [IU]/h via INTRAVENOUS
  Filled 2021-03-27: qty 100

## 2021-03-27 MED ORDER — DEXMEDETOMIDINE HCL IN NACL 400 MCG/100ML IV SOLN
0.1000 ug/kg/h | INTRAVENOUS | Status: AC
Start: 2021-03-30 — End: 2021-03-30
  Administered 2021-03-30: .7 ug/kg/h via INTRAVENOUS
  Filled 2021-03-27: qty 100

## 2021-03-27 MED ORDER — MAGNESIUM SULFATE 50 % IJ SOLN
40.0000 meq | INTRAMUSCULAR | Status: DC
  Filled 2021-03-27: qty 9.85

## 2021-03-27 MED ORDER — CEFAZOLIN SODIUM-DEXTROSE 2-4 GM/100ML-% IV SOLN
2.0000 g | INTRAVENOUS | Status: AC
  Administered 2021-03-30: 2 g via INTRAVENOUS
  Filled 2021-03-27 (×2): qty 100

## 2021-03-27 MED ORDER — CEFAZOLIN SODIUM-DEXTROSE 2-4 GM/100ML-% IV SOLN
2.0000 g | INTRAVENOUS | Status: AC
  Administered 2021-03-30: 2 g via INTRAVENOUS
  Filled 2021-03-27: qty 100

## 2021-03-27 MED ORDER — PHENYLEPHRINE HCL-NACL 20-0.9 MG/250ML-% IV SOLN
30.0000 ug/min | INTRAVENOUS | Status: AC
  Administered 2021-03-30: 60 ug/min via INTRAVENOUS
  Filled 2021-03-27: qty 250

## 2021-03-27 NOTE — Progress Notes (Addendum)
PCP - Genene Churn, MD @ [facility] Cardiologist - Brooke Edmisten @ [facility]  PPM/ICD - Denies  Chest x-ray - 03/27/21 EKG - 03/05/21 Stress Test - 03/24/15 ECHO - 07/2018 Cardiac Cath - 03/12/21 Dopplers- completed 03/27/21  PFT's- clarified w/ Alyssa, RN w/ [facility], PFTs not needed  Sleep Study - Yes, positive for OSA CPAP - No  DM- Denies  Blood Thinner Instructions: Stop Pradaxa 3 days prior to surgery Aspirin Instructions: N/A  ERAS Protcol - Not ordered PRE-SURGERY Ensure or G2- N/A  COVID TEST- Per pt, positive Home covid test July 11, did not notify pcp, nor sought treatment at clinic. Symptoms included Fatigue and headache x 3 days. Alyssa, RN with [facility] notified. Covid swab performed 03/27/21; results pending.   Anesthesia review: Yes, cardiac hx.  Patient denies shortness of breath, fever, cough and chest pain at PAT appointment   All instructions explained to the patient, with a verbal understanding of the material. Patient agrees to go over the instructions while at home for a better understanding. The opportunity to ask questions was provided.

## 2021-03-27 NOTE — Progress Notes (Signed)
Patient's wife called to inform us that yesterday she was exposed to someone who tested positive for covid today.  Patient did have his covid test done today.  Instructed  patient to call Dr. Leonarda Salon office and inform the on call provider.  I informed the patient that he will need to be retested on Monday when he arrives.

## 2021-03-27 NOTE — Pre-Procedure Instructions (Signed)
Surgical Instructions:    Your procedure is scheduled on Monday, September 26th.  Report to [facility] Main Entrance "A" at 05:30 A.M., then check in with the Admitting office.  Call this number if you have any questions prior to your surgery date, or have problems the morning of surgery:  [phone]    Remember:  Do not eat or drink after midnight the night before your surgery.     Take these medicines the morning of surgery with A SIP OF WATER:  atorvastatin (LIPITOR)  DAROLUTAMIDE  diltiazem (CARDIZEM CD) omeprazole (PRILOSEC)  IF NEEDED: carboxymethylcellulose (REFRESH PLUS) eye drops   >>STOP taking dabigatran (PRADAXA) 3 days prior to surgery. Your last dose should be taken 03/26/21<<   As of today, STOP taking any Aspirin (unless otherwise instructed by your surgeon), NSAIDs such as diclofenac sodium (VOLTAREN) GEL, Aleve, Naproxen, Ibuprofen, Motrin, Advil, Goody's, BC's, all herbal medications, fish oil, and all vitamins.             Special instructions:    [place]- Preparing For Surgery  Before surgery, you can play an important role. Because skin is not sterile, your skin needs to be as free of germs as possible. You can reduce the number of germs on your skin by washing with CHG (chlorahexidine gluconate) Soap before surgery.  CHG is an antiseptic cleaner which kills germs and bonds with the skin to continue killing germs even after washing.     Please do not use if you have an allergy to CHG or antibacterial soaps. If your skin becomes reddened/irritated stop using the CHG.  Do not shave (including legs and underarms) for at least 48 hours prior to first CHG shower. It is OK to shave your face.  Please follow these instructions carefully.     Shower the NIGHT BEFORE SURGERY and the MORNING OF SURGERY with CHG Soap.   If you chose to wash your hair, wash your hair first as usual with your normal shampoo. After you shampoo, rinse your hair and body  thoroughly to remove the shampoo.  Then [employer] and genitals (private parts) with your normal soap and rinse thoroughly to remove soap.  After that Use CHG Soap as you would any other liquid soap. You can apply CHG directly to the skin and wash gently with a clean washcloth.   Apply the CHG Soap to your body ONLY FROM THE NECK DOWN.  Do not use on open wounds or open sores. Avoid contact with your eyes, ears, mouth and genitals (private parts). Wash Face and genitals (private parts)  with your normal soap.   Wash thoroughly, paying special attention to the area where your surgery will be performed.  Thoroughly rinse your body with warm water from the neck down.  DO NOT shower/wash with your normal soap after using and rinsing off the CHG Soap.  Pat yourself dry with a CLEAN TOWEL.  Wear CLEAN PAJAMAS to bed the night before surgery.  Place CLEAN SHEETS on your bed the night before your surgery.  DO NOT SLEEP WITH PETS.   Day of Surgery:  Take a shower with CHG soap. Wear Clean/Comfortable clothing the morning of surgery Do not wear lotions, powders, colognes, or deodorant.   Remember to brush your teeth WITH YOUR REGULAR TOOTHPASTE. Do not wear jewelry. Men may shave face and neck. Do not bring valuables to the hospital. [facility] is not responsible for any belongings or valuables.  Do NOT Smoke (Tobacco/Vaping) or drink  Alcohol 24 hours prior to your procedure.  If you use a CPAP at night, you may bring all equipment for your overnight stay.   Contacts, glasses, dentures or bridgework may not be worn into surgery, please bring cases for these belongings.   For patients admitted to the hospital, discharge time will be determined by your treatment team.  Patients discharged the day of surgery will not be allowed to drive home, and someone needs to stay with them for 24 hours.  NO VISITORS WILL BE ALLOWED IN PRE-OP WHERE PATIENTS GET READY FOR SURGERY.  ONLY 1 SUPPORT PERSON  MAY BE PRESENT WHILE YOU ARE IN SURGERY.  IF YOU ARE TO BE ADMITTED, ONCE YOU ARE IN YOUR ROOM YOU WILL BE ALLOWED TWO (2) VISITORS.  Minor children may have two parents present. Special consideration for safety and communication needs will be reviewed on a case by case basis.    Please read over the following fact sheets that you were given.

## 2021-03-29 NOTE — Anesthesia Preprocedure Evaluation (Addendum)
Anesthesia Evaluation  Patient identified by MRN, date of birth, ID band Patient awake    Reviewed: Allergy & Precautions, H&P , NPO status , Patient's Chart, lab work & pertinent test results  Airway Mallampati: II  TM Distance: >3 FB Neck ROM: Full    Dental  (+) Dental Advisory Given, Teeth Intact   Pulmonary shortness of breath and with exertion, sleep apnea ,    Pulmonary exam normal breath sounds clear to auscultation       Cardiovascular Exercise Tolerance: Good + angina + CAD  Normal cardiovascular exam+ dysrhythmias Atrial Fibrillation  Rhythm:Regular Rate:Normal  LHC 2016  Nonobstructive coronary artery disease of the LAD at the bifurcation of the first diagonal branch of approximately 40% when compared to reference vessel size diameter.  Normal left ventricular ejection fraction, EF 50-55%vc   Neuro/Psych negative neurological ROS  negative psych ROS   GI/Hepatic negative GI ROS, Neg liver ROS,   Endo/Other  negative endocrine ROS  Renal/GU negative Renal ROS     Musculoskeletal  (+) Arthritis ,   Abdominal   Peds  Hematology negative hematology ROS (+)   Anesthesia Other Findings   Reproductive/Obstetrics                            Anesthesia Physical  Anesthesia Plan  ASA: 4  Anesthesia Plan: General   Post-op Pain Management:    Induction: Intravenous  PONV Risk Score and Plan: 3 and Treatment may vary due to age or medical condition and Midazolam  Airway Management Planned: Oral ETT  Additional Equipment: Arterial line, CVP, PA Cath, TEE and Ultrasound Guidance Line Placement  Intra-op Plan: Utilization Of Total Body Hypothermia per surgeon request  Post-operative Plan: Post-operative intubation/ventilation  Informed Consent: I have reviewed the patients History and Physical, chart, labs and discussed the procedure including the risks, benefits and  alternatives for the proposed anesthesia with the patient or authorized representative who has indicated his/her understanding and acceptance.     Dental advisory given  Plan Discussed with: CRNA  Anesthesia Plan Comments:        Anesthesia Quick Evaluation

## 2021-03-30 ENCOUNTER — Inpatient Hospital Stay (HOSPITAL_COMMUNITY): Payer: No Typology Code available for payment source

## 2021-03-30 ENCOUNTER — Inpatient Hospital Stay (HOSPITAL_COMMUNITY)
Admission: RE | Disposition: A | Discharge status: Home / Self Care | Payer: Self-pay | Source: Ambulatory Visit | Attending: Thoracic Surgery (Cardiothoracic Vascular Surgery)

## 2021-03-30 ENCOUNTER — Inpatient Hospital Stay (HOSPITAL_COMMUNITY): Payer: No Typology Code available for payment source | Admitting: Certified Registered Nurse Anesthetist

## 2021-03-30 ENCOUNTER — Other Ambulatory Visit: Payer: Self-pay

## 2021-03-30 ENCOUNTER — Inpatient Hospital Stay (HOSPITAL_COMMUNITY)
Admission: RE | Admit: 2021-03-30 | Discharge: 2021-04-04 | DRG: 236 | Disposition: A | Discharge status: Home / Self Care | Payer: No Typology Code available for payment source | Attending: Thoracic Surgery (Cardiothoracic Vascular Surgery) | Admitting: Thoracic Surgery (Cardiothoracic Vascular Surgery)

## 2021-03-30 ENCOUNTER — Encounter (HOSPITAL_COMMUNITY): Payer: Self-pay | Admitting: Thoracic Surgery (Cardiothoracic Vascular Surgery)

## 2021-03-30 ENCOUNTER — Inpatient Hospital Stay (HOSPITAL_COMMUNITY): Payer: No Typology Code available for payment source | Admitting: Physician Assistant

## 2021-03-30 DIAGNOSIS — I10 Essential (primary) hypertension: Secondary | ICD-10-CM | POA: Diagnosis present

## 2021-03-30 DIAGNOSIS — I2510 Atherosclerotic heart disease of native coronary artery without angina pectoris: Secondary | ICD-10-CM | POA: Diagnosis not present

## 2021-03-30 DIAGNOSIS — Z96652 Presence of left artificial knee joint: Secondary | ICD-10-CM | POA: Diagnosis present

## 2021-03-30 DIAGNOSIS — E8770 Fluid overload, unspecified: Secondary | ICD-10-CM | POA: Diagnosis present

## 2021-03-30 DIAGNOSIS — Z800 Family history of malignant neoplasm of digestive organs: Secondary | ICD-10-CM

## 2021-03-30 DIAGNOSIS — I878 Other specified disorders of veins: Secondary | ICD-10-CM | POA: Diagnosis present

## 2021-03-30 DIAGNOSIS — I25119 Atherosclerotic heart disease of native coronary artery with unspecified angina pectoris: Secondary | ICD-10-CM | POA: Diagnosis present

## 2021-03-30 DIAGNOSIS — Z7901 Long term (current) use of anticoagulants: Secondary | ICD-10-CM | POA: Diagnosis not present

## 2021-03-30 DIAGNOSIS — Z803 Family history of malignant neoplasm of breast: Secondary | ICD-10-CM | POA: Diagnosis not present

## 2021-03-30 DIAGNOSIS — Z8546 Personal history of malignant neoplasm of prostate: Secondary | ICD-10-CM

## 2021-03-30 DIAGNOSIS — I459 Conduction disorder, unspecified: Secondary | ICD-10-CM | POA: Diagnosis present

## 2021-03-30 DIAGNOSIS — D696 Thrombocytopenia, unspecified: Secondary | ICD-10-CM | POA: Diagnosis present

## 2021-03-30 DIAGNOSIS — E871 Hypo-osmolality and hyponatremia: Secondary | ICD-10-CM | POA: Diagnosis not present

## 2021-03-30 DIAGNOSIS — I959 Hypotension, unspecified: Secondary | ICD-10-CM | POA: Diagnosis present

## 2021-03-30 DIAGNOSIS — N179 Acute kidney failure, unspecified: Secondary | ICD-10-CM | POA: Diagnosis not present

## 2021-03-30 DIAGNOSIS — Z20822 Contact with and (suspected) exposure to covid-19: Secondary | ICD-10-CM | POA: Diagnosis present

## 2021-03-30 DIAGNOSIS — D62 Acute posthemorrhagic anemia: Secondary | ICD-10-CM | POA: Diagnosis not present

## 2021-03-30 DIAGNOSIS — E785 Hyperlipidemia, unspecified: Secondary | ICD-10-CM | POA: Diagnosis present

## 2021-03-30 DIAGNOSIS — Z96641 Presence of right artificial hip joint: Secondary | ICD-10-CM | POA: Diagnosis present

## 2021-03-30 DIAGNOSIS — I2582 Chronic total occlusion of coronary artery: Secondary | ICD-10-CM | POA: Diagnosis present

## 2021-03-30 DIAGNOSIS — Z888 Allergy status to other drugs, medicaments and biological substances status: Secondary | ICD-10-CM

## 2021-03-30 DIAGNOSIS — Z23 Encounter for immunization: Secondary | ICD-10-CM | POA: Diagnosis not present

## 2021-03-30 DIAGNOSIS — Z8042 Family history of malignant neoplasm of prostate: Secondary | ICD-10-CM | POA: Diagnosis not present

## 2021-03-30 DIAGNOSIS — Z7952 Long term (current) use of systemic steroids: Secondary | ICD-10-CM

## 2021-03-30 DIAGNOSIS — I4819 Other persistent atrial fibrillation: Principal | ICD-10-CM | POA: Diagnosis present

## 2021-03-30 DIAGNOSIS — Z951 Presence of aortocoronary bypass graft: Secondary | ICD-10-CM

## 2021-03-30 DIAGNOSIS — I4891 Unspecified atrial fibrillation: Secondary | ICD-10-CM

## 2021-03-30 DIAGNOSIS — J9811 Atelectasis: Secondary | ICD-10-CM | POA: Diagnosis not present

## 2021-03-30 DIAGNOSIS — J90 Pleural effusion, not elsewhere classified: Secondary | ICD-10-CM | POA: Diagnosis not present

## 2021-03-30 DIAGNOSIS — Z79899 Other long term (current) drug therapy: Secondary | ICD-10-CM | POA: Diagnosis not present

## 2021-03-30 DIAGNOSIS — I480 Paroxysmal atrial fibrillation: Secondary | ICD-10-CM | POA: Diagnosis not present

## 2021-03-30 HISTORY — PX: TEE WITHOUT CARDIOVERSION: SHX5443

## 2021-03-30 HISTORY — PX: CORONARY ARTERY BYPASS GRAFT: SHX141

## 2021-03-30 HISTORY — PX: CLIPPING OF ATRIAL APPENDAGE: SHX5773

## 2021-03-30 LAB — POCT I-STAT 7, (LYTES, BLD GAS, ICA,H+H)
Acid-Base Excess: 2 mmol/L (ref 0.0–2.0)
Acid-Base Excess: 1 mmol/L (ref 0.0–2.0)
Acid-Base Excess: 0 mmol/L (ref 0.0–2.0)
Acid-base deficit: 2 mmol/L (ref 0.0–2.0)
Acid-base deficit: 6 mmol/L — ABNORMAL HIGH (ref 0.0–2.0)
Acid-base deficit: 3 mmol/L — ABNORMAL HIGH (ref 0.0–2.0)
Bicarbonate: 26.2 mmol/L (ref 20.0–28.0)
Bicarbonate: 25.4 mmol/L (ref 20.0–28.0)
Bicarbonate: 23.8 mmol/L (ref 20.0–28.0)
Bicarbonate: 24.9 mmol/L (ref 20.0–28.0)
Bicarbonate: 19.3 mmol/L — ABNORMAL LOW (ref 20.0–28.0)
Bicarbonate: 23.8 mmol/L (ref 20.0–28.0)
Calcium, Ion: 1.06 mmol/L — ABNORMAL LOW (ref 1.15–1.40)
Calcium, Ion: 1.07 mmol/L — ABNORMAL LOW (ref 1.15–1.40)
Calcium, Ion: 1.07 mmol/L — ABNORMAL LOW (ref 1.15–1.40)
Calcium, Ion: 1.08 mmol/L — ABNORMAL LOW (ref 1.15–1.40)
Calcium, Ion: 1.04 mmol/L — ABNORMAL LOW (ref 1.15–1.40)
Calcium, Ion: 1.3 mmol/L (ref 1.15–1.40)
HCT: 26 % — ABNORMAL LOW (ref 39.0–52.0)
HCT: 25 % — ABNORMAL LOW (ref 39.0–52.0)
HCT: 26 % — ABNORMAL LOW (ref 39.0–52.0)
HCT: 26 % — ABNORMAL LOW (ref 39.0–52.0)
HCT: 28 % — ABNORMAL LOW (ref 39.0–52.0)
HCT: 29 % — ABNORMAL LOW (ref 39.0–52.0)
Hemoglobin: 8.8 g/dL — ABNORMAL LOW (ref 13.0–17.0)
Hemoglobin: 8.5 g/dL — ABNORMAL LOW (ref 13.0–17.0)
Hemoglobin: 8.8 g/dL — ABNORMAL LOW (ref 13.0–17.0)
Hemoglobin: 8.8 g/dL — ABNORMAL LOW (ref 13.0–17.0)
Hemoglobin: 9.5 g/dL — ABNORMAL LOW (ref 13.0–17.0)
Hemoglobin: 9.9 g/dL — ABNORMAL LOW (ref 13.0–17.0)
O2 Saturation: 100 %
O2 Saturation: 100 %
O2 Saturation: 100 %
O2 Saturation: 100 %
O2 Saturation: 99 %
O2 Saturation: 94 %
Patient temperature: 35.1
Patient temperature: 37.1
Potassium: 4.2 mmol/L (ref 3.5–5.1)
Potassium: 5.2 mmol/L — ABNORMAL HIGH (ref 3.5–5.1)
Potassium: 5.2 mmol/L — ABNORMAL HIGH (ref 3.5–5.1)
Potassium: 5.6 mmol/L — ABNORMAL HIGH (ref 3.5–5.1)
Potassium: 3.8 mmol/L (ref 3.5–5.1)
Potassium: 4.8 mmol/L (ref 3.5–5.1)
Sodium: 138 mmol/L (ref 135–145)
Sodium: 134 mmol/L — ABNORMAL LOW (ref 135–145)
Sodium: 135 mmol/L (ref 135–145)
Sodium: 137 mmol/L (ref 135–145)
Sodium: 140 mmol/L (ref 135–145)
Sodium: 136 mmol/L (ref 135–145)
TCO2: 27 mmol/L (ref 22–32)
TCO2: 27 mmol/L (ref 22–32)
TCO2: 25 mmol/L (ref 22–32)
TCO2: 26 mmol/L (ref 22–32)
TCO2: 20 mmol/L — ABNORMAL LOW (ref 22–32)
TCO2: 25 mmol/L (ref 22–32)
pCO2 arterial: 39.7 mmHg (ref 32.0–48.0)
pCO2 arterial: 36.1 mmHg (ref 32.0–48.0)
pCO2 arterial: 40.3 mmHg (ref 32.0–48.0)
pCO2 arterial: 42.4 mmHg (ref 32.0–48.0)
pCO2 arterial: 33.1 mmHg (ref 32.0–48.0)
pCO2 arterial: 47.5 mmHg (ref 32.0–48.0)
pH, Arterial: 7.427 (ref 7.350–7.450)
pH, Arterial: 7.455 — ABNORMAL HIGH (ref 7.350–7.450)
pH, Arterial: 7.356 (ref 7.350–7.450)
pH, Arterial: 7.4 (ref 7.350–7.450)
pH, Arterial: 7.365 (ref 7.350–7.450)
pH, Arterial: 7.308 — ABNORMAL LOW (ref 7.350–7.450)
pO2, Arterial: 371 mmHg — ABNORMAL HIGH (ref 83.0–108.0)
pO2, Arterial: 362 mmHg — ABNORMAL HIGH (ref 83.0–108.0)
pO2, Arterial: 335 mmHg — ABNORMAL HIGH (ref 83.0–108.0)
pO2, Arterial: 394 mmHg — ABNORMAL HIGH (ref 83.0–108.0)
pO2, Arterial: 120 mmHg — ABNORMAL HIGH (ref 83.0–108.0)
pO2, Arterial: 80 mmHg — ABNORMAL LOW (ref 83.0–108.0)

## 2021-03-30 LAB — POCT I-STAT, CHEM 8
BUN: 19 mg/dL (ref 8–23)
BUN: 18 mg/dL (ref 8–23)
BUN: 17 mg/dL (ref 8–23)
BUN: 18 mg/dL (ref 8–23)
BUN: 17 mg/dL (ref 8–23)
Calcium, Ion: 1.28 mmol/L (ref 1.15–1.40)
Calcium, Ion: 1.22 mmol/L (ref 1.15–1.40)
Calcium, Ion: 1.09 mmol/L — ABNORMAL LOW (ref 1.15–1.40)
Calcium, Ion: 1.05 mmol/L — ABNORMAL LOW (ref 1.15–1.40)
Calcium, Ion: 1.07 mmol/L — ABNORMAL LOW (ref 1.15–1.40)
Chloride: 104 mmol/L (ref 98–111)
Chloride: 105 mmol/L (ref 98–111)
Chloride: 103 mmol/L (ref 98–111)
Chloride: 103 mmol/L (ref 98–111)
Chloride: 105 mmol/L (ref 98–111)
Creatinine, Ser: 0.9 mg/dL (ref 0.61–1.24)
Creatinine, Ser: 0.9 mg/dL (ref 0.61–1.24)
Creatinine, Ser: 0.8 mg/dL (ref 0.61–1.24)
Creatinine, Ser: 0.8 mg/dL (ref 0.61–1.24)
Creatinine, Ser: 0.7 mg/dL (ref 0.61–1.24)
Glucose, Bld: 104 mg/dL — ABNORMAL HIGH (ref 70–99)
Glucose, Bld: 112 mg/dL — ABNORMAL HIGH (ref 70–99)
Glucose, Bld: 114 mg/dL — ABNORMAL HIGH (ref 70–99)
Glucose, Bld: 116 mg/dL — ABNORMAL HIGH (ref 70–99)
Glucose, Bld: 136 mg/dL — ABNORMAL HIGH (ref 70–99)
HCT: 32 % — ABNORMAL LOW (ref 39.0–52.0)
HCT: 31 % — ABNORMAL LOW (ref 39.0–52.0)
HCT: 25 % — ABNORMAL LOW (ref 39.0–52.0)
HCT: 24 % — ABNORMAL LOW (ref 39.0–52.0)
HCT: 27 % — ABNORMAL LOW (ref 39.0–52.0)
Hemoglobin: 10.9 g/dL — ABNORMAL LOW (ref 13.0–17.0)
Hemoglobin: 10.5 g/dL — ABNORMAL LOW (ref 13.0–17.0)
Hemoglobin: 8.5 g/dL — ABNORMAL LOW (ref 13.0–17.0)
Hemoglobin: 8.2 g/dL — ABNORMAL LOW (ref 13.0–17.0)
Hemoglobin: 9.2 g/dL — ABNORMAL LOW (ref 13.0–17.0)
Potassium: 3.9 mmol/L (ref 3.5–5.1)
Potassium: 4.1 mmol/L (ref 3.5–5.1)
Potassium: 5.4 mmol/L — ABNORMAL HIGH (ref 3.5–5.1)
Potassium: 5.5 mmol/L — ABNORMAL HIGH (ref 3.5–5.1)
Potassium: 5.3 mmol/L — ABNORMAL HIGH (ref 3.5–5.1)
Sodium: 138 mmol/L (ref 135–145)
Sodium: 138 mmol/L (ref 135–145)
Sodium: 134 mmol/L — ABNORMAL LOW (ref 135–145)
Sodium: 134 mmol/L — ABNORMAL LOW (ref 135–145)
Sodium: 136 mmol/L (ref 135–145)
TCO2: 27 mmol/L (ref 22–32)
TCO2: 25 mmol/L (ref 22–32)
TCO2: 26 mmol/L (ref 22–32)
TCO2: 24 mmol/L (ref 22–32)
TCO2: 24 mmol/L (ref 22–32)

## 2021-03-30 LAB — POCT I-STAT EG7
Acid-Base Excess: 0 mmol/L (ref 0.0–2.0)
Bicarbonate: 24.6 mmol/L (ref 20.0–28.0)
Calcium, Ion: 1.11 mmol/L — ABNORMAL LOW (ref 1.15–1.40)
HCT: 27 % — ABNORMAL LOW (ref 39.0–52.0)
Hemoglobin: 9.2 g/dL — ABNORMAL LOW (ref 13.0–17.0)
O2 Saturation: 80 %
Potassium: 4.2 mmol/L (ref 3.5–5.1)
Sodium: 139 mmol/L (ref 135–145)
TCO2: 26 mmol/L (ref 22–32)
pCO2, Ven: 40.2 mmHg — ABNORMAL LOW (ref 44.0–60.0)
pH, Ven: 7.394 (ref 7.250–7.430)
pO2, Ven: 45 mmHg (ref 32.0–45.0)

## 2021-03-30 LAB — APTT: aPTT: 32 seconds (ref 24–36)

## 2021-03-30 LAB — GLUCOSE, CAPILLARY
Glucose-Capillary: 111 mg/dL — ABNORMAL HIGH (ref 70–99)
Glucose-Capillary: 131 mg/dL — ABNORMAL HIGH (ref 70–99)
Glucose-Capillary: 113 mg/dL — ABNORMAL HIGH (ref 70–99)
Glucose-Capillary: 134 mg/dL — ABNORMAL HIGH (ref 70–99)
Glucose-Capillary: 133 mg/dL — ABNORMAL HIGH (ref 70–99)

## 2021-03-30 LAB — PLATELET COUNT: Platelets: 125 10*3/uL — ABNORMAL LOW (ref 150–400)

## 2021-03-30 LAB — CBC
HCT: 31.7 % — ABNORMAL LOW (ref 39.0–52.0)
Hemoglobin: 10.6 g/dL — ABNORMAL LOW (ref 13.0–17.0)
MCH: 31.5 pg (ref 26.0–34.0)
MCHC: 33.4 g/dL (ref 30.0–36.0)
MCV: 94.1 fL (ref 80.0–100.0)
Platelets: 130 10*3/uL — ABNORMAL LOW (ref 150–400)
RBC: 3.37 MIL/uL — ABNORMAL LOW (ref 4.22–5.81)
RDW: 13.5 % (ref 11.5–15.5)
WBC: 11.1 10*3/uL — ABNORMAL HIGH (ref 4.0–10.5)
nRBC: 0 % (ref 0.0–0.2)

## 2021-03-30 LAB — SARS CORONAVIRUS 2 BY RT PCR (HOSPITAL ORDER, PERFORMED IN ~~LOC~~ HOSPITAL LAB): SARS Coronavirus 2: NEGATIVE

## 2021-03-30 LAB — HEMOGLOBIN AND HEMATOCRIT, BLOOD
HCT: 25.8 % — ABNORMAL LOW (ref 39.0–52.0)
Hemoglobin: 8.7 g/dL — ABNORMAL LOW (ref 13.0–17.0)

## 2021-03-30 LAB — ECHO INTRAOPERATIVE TEE
Height: 71 in
Weight: 3040 oz

## 2021-03-30 LAB — PREPARE RBC (CROSSMATCH)

## 2021-03-30 LAB — PROTIME-INR
INR: 1.4 — ABNORMAL HIGH (ref 0.8–1.2)
Prothrombin Time: 16.7 seconds — ABNORMAL HIGH (ref 11.4–15.2)

## 2021-03-30 SURGERY — CORONARY ARTERY BYPASS GRAFTING (CABG)
Anesthesia: General | Site: Chest

## 2021-03-30 MED ORDER — METOPROLOL TARTRATE 12.5 MG HALF TABLET
12.5000 mg | ORAL_TABLET | Freq: Once | ORAL | Status: AC
  Administered 2021-03-30: 12.5 mg via ORAL
  Filled 2021-03-30: qty 1

## 2021-03-30 MED ORDER — ONDANSETRON HCL 4 MG/2ML IJ SOLN
INTRAMUSCULAR | Status: AC
  Filled 2021-03-30: qty 2

## 2021-03-30 MED ORDER — SODIUM CHLORIDE (PF) 0.9 % IJ SOLN
OROMUCOSAL | Status: DC | PRN
  Administered 2021-03-30: 12 mL via TOPICAL

## 2021-03-30 MED ORDER — DEXTROSE 50 % IV SOLN: 0.0000 mL | INTRAVENOUS | Status: DC | PRN

## 2021-03-30 MED ORDER — ACETAMINOPHEN 650 MG RE SUPP
650.0000 mg | Freq: Once | RECTAL | Status: AC
  Administered 2021-03-30: 650 mg via RECTAL

## 2021-03-30 MED ORDER — HEMOSTATIC AGENTS (NO CHARGE) OPTIME
TOPICAL | Status: DC | PRN
  Administered 2021-03-30: 1 via TOPICAL

## 2021-03-30 MED ORDER — SODIUM CHLORIDE 0.9% FLUSH: 3.0000 mL | INTRAVENOUS | Status: DC | PRN

## 2021-03-30 MED ORDER — CALCIUM CHLORIDE 10 % IV SOLN
1.0000 g | Freq: Once | INTRAVENOUS | Status: AC
  Administered 2021-03-30: 1 g via INTRAVENOUS

## 2021-03-30 MED ORDER — MIDAZOLAM HCL (PF) 5 MG/ML IJ SOLN
INTRAMUSCULAR | Status: DC | PRN
  Administered 2021-03-30: 4 mg via INTRAVENOUS
  Administered 2021-03-30 (×2): 1 mg via INTRAVENOUS

## 2021-03-30 MED ORDER — ATORVASTATIN CALCIUM 40 MG PO TABS
40.0000 mg | ORAL_TABLET | Freq: Every day | ORAL | Status: DC
  Administered 2021-03-30 – 2021-04-04 (×6): 40 mg via ORAL
  Filled 2021-03-30 (×6): qty 1

## 2021-03-30 MED ORDER — METOPROLOL TARTRATE 12.5 MG HALF TABLET
12.5000 mg | ORAL_TABLET | Freq: Two times a day (BID) | ORAL | Status: DC
  Administered 2021-03-30 – 2021-04-02 (×5): 12.5 mg via ORAL
  Filled 2021-03-30 (×5): qty 1

## 2021-03-30 MED ORDER — METOCLOPRAMIDE HCL 5 MG/ML IJ SOLN
10.0000 mg | Freq: Four times a day (QID) | INTRAMUSCULAR | Status: AC
  Administered 2021-03-30 – 2021-03-31 (×4): 10 mg via INTRAVENOUS
  Filled 2021-03-30 (×4): qty 2

## 2021-03-30 MED ORDER — CHLORHEXIDINE GLUCONATE 4 % EX LIQD: 30.0000 mL | CUTANEOUS | Status: DC

## 2021-03-30 MED ORDER — ASPIRIN EC 325 MG PO TBEC
325.0000 mg | DELAYED_RELEASE_TABLET | Freq: Every day | ORAL | Status: DC
  Administered 2021-03-31: 325 mg via ORAL
  Filled 2021-03-30: qty 1

## 2021-03-30 MED ORDER — CHOLECALCIFEROL 25 MCG (1000 UT) PO CAPS: 6000.0000 [IU] | ORAL_CAPSULE | ORAL | Status: DC

## 2021-03-30 MED ORDER — FENTANYL CITRATE (PF) 250 MCG/5ML IJ SOLN
INTRAMUSCULAR | Status: AC
  Filled 2021-03-30: qty 25

## 2021-03-30 MED ORDER — TRAMADOL HCL 50 MG PO TABS
50.0000 mg | ORAL_TABLET | ORAL | Status: DC | PRN
  Administered 2021-03-30: 100 mg via ORAL
  Administered 2021-03-31: 50 mg via ORAL
  Filled 2021-03-30: qty 2
  Filled 2021-03-30: qty 1

## 2021-03-30 MED ORDER — SODIUM CHLORIDE 0.45 % IV SOLN: INTRAVENOUS | Status: DC | PRN

## 2021-03-30 MED ORDER — POTASSIUM CHLORIDE 10 MEQ/50ML IV SOLN
10.0000 meq | INTRAVENOUS | Status: AC
Start: 2021-03-30 — End: 2021-03-30
  Administered 2021-03-30 (×3): 10 meq via INTRAVENOUS

## 2021-03-30 MED ORDER — MAGNESIUM SULFATE 4 GM/100ML IV SOLN
4.0000 g | Freq: Once | INTRAVENOUS | Status: AC
  Administered 2021-03-30: 4 g via INTRAVENOUS
  Filled 2021-03-30: qty 100

## 2021-03-30 MED ORDER — SODIUM CHLORIDE 0.9 % IV SOLN: 250.0000 mL | INTRAVENOUS | Status: DC

## 2021-03-30 MED ORDER — ROCURONIUM BROMIDE 10 MG/ML (PF) SYRINGE
PREFILLED_SYRINGE | INTRAVENOUS | Status: AC
  Filled 2021-03-30: qty 10

## 2021-03-30 MED ORDER — GABAPENTIN 300 MG PO CAPS
300.0000 mg | ORAL_CAPSULE | Freq: Every day | ORAL | Status: DC
  Administered 2021-03-30 – 2021-04-03 (×5): 300 mg via ORAL
  Filled 2021-03-30 (×5): qty 1

## 2021-03-30 MED ORDER — LACTATED RINGERS IV SOLN: 500.0000 mL | Freq: Once | INTRAVENOUS | Status: DC | PRN

## 2021-03-30 MED ORDER — LACTATED RINGERS IV SOLN: INTRAVENOUS | Status: DC

## 2021-03-30 MED ORDER — PROPOFOL 10 MG/ML IV BOLUS
INTRAVENOUS | Status: DC | PRN
  Administered 2021-03-30 (×2): 50 mg via INTRAVENOUS

## 2021-03-30 MED ORDER — 0.9 % SODIUM CHLORIDE (POUR BTL) OPTIME
TOPICAL | Status: DC | PRN
  Administered 2021-03-30: 5000 mL

## 2021-03-30 MED ORDER — METOPROLOL TARTRATE 5 MG/5ML IV SOLN
2.5000 mg | INTRAVENOUS | Status: DC | PRN
  Administered 2021-03-30: 2.5 mg via INTRAVENOUS
  Filled 2021-03-30: qty 5

## 2021-03-30 MED ORDER — INSULIN REGULAR(HUMAN) IN NACL 100-0.9 UT/100ML-% IV SOLN: INTRAVENOUS | Status: DC

## 2021-03-30 MED ORDER — ALBUMIN HUMAN 5 % IV SOLN: 12.5000 g | Freq: Once | INTRAVENOUS | Status: AC

## 2021-03-30 MED ORDER — OXYCODONE HCL 5 MG PO TABS
5.0000 mg | ORAL_TABLET | ORAL | Status: DC | PRN
  Administered 2021-03-30 – 2021-03-31 (×2): 10 mg via ORAL
  Filled 2021-03-30 (×2): qty 2

## 2021-03-30 MED ORDER — DOPAMINE-DEXTROSE 3.2-5 MG/ML-% IV SOLN
0.0000 ug/kg/min | INTRAVENOUS | Status: DC
  Administered 2021-03-30: 3 ug/kg/min via INTRAVENOUS

## 2021-03-30 MED ORDER — ACETAMINOPHEN 500 MG PO TABS
1000.0000 mg | ORAL_TABLET | Freq: Four times a day (QID) | ORAL | Status: DC
  Administered 2021-03-30 – 2021-04-04 (×17): 1000 mg via ORAL
  Filled 2021-03-30 (×16): qty 2

## 2021-03-30 MED ORDER — BISACODYL 10 MG RE SUPP
10.0000 mg | Freq: Every day | RECTAL | Status: DC
Start: 2021-03-31 — End: 2021-04-02

## 2021-03-30 MED ORDER — CARBOXYMETHYLCELLULOSE SODIUM 0.5 % OP SOLN: 1.0000 [drp] | Freq: Two times a day (BID) | OPHTHALMIC | Status: DC | PRN

## 2021-03-30 MED ORDER — CHLORHEXIDINE GLUCONATE 0.12 % MT SOLN
15.0000 mL | OROMUCOSAL | Status: AC
  Administered 2021-03-30: 15 mL via OROMUCOSAL

## 2021-03-30 MED ORDER — MIDAZOLAM HCL 2 MG/2ML IJ SOLN: 2.0000 mg | INTRAMUSCULAR | Status: DC | PRN

## 2021-03-30 MED ORDER — PHENYLEPHRINE 40 MCG/ML (10ML) SYRINGE FOR IV PUSH (FOR BLOOD PRESSURE SUPPORT)
PREFILLED_SYRINGE | INTRAVENOUS | Status: DC | PRN
  Administered 2021-03-30 (×2): 80 ug via INTRAVENOUS

## 2021-03-30 MED ORDER — ALBUMIN HUMAN 5 % IV SOLN
250.0000 mL | INTRAVENOUS | Status: AC | PRN
  Administered 2021-03-30 (×4): 12.5 g via INTRAVENOUS
  Filled 2021-03-30 (×2): qty 250

## 2021-03-30 MED ORDER — LACTATED RINGERS IV SOLN: INTRAVENOUS | Status: DC | PRN

## 2021-03-30 MED ORDER — PROPOFOL 10 MG/ML IV BOLUS
INTRAVENOUS | Status: AC
  Filled 2021-03-30: qty 40

## 2021-03-30 MED ORDER — HEPARIN SODIUM (PORCINE) 1000 UNIT/ML IJ SOLN
INTRAMUSCULAR | Status: DC | PRN
  Administered 2021-03-30: 25000 [IU] via INTRAVENOUS
  Administered 2021-03-30: 2000 [IU] via INTRAVENOUS

## 2021-03-30 MED ORDER — PANTOPRAZOLE SODIUM 40 MG PO TBEC
40.0000 mg | DELAYED_RELEASE_TABLET | Freq: Every day | ORAL | Status: DC
  Administered 2021-04-01 – 2021-04-04 (×4): 40 mg via ORAL
  Filled 2021-03-30 (×4): qty 1

## 2021-03-30 MED ORDER — SODIUM CHLORIDE 0.9% FLUSH
3.0000 mL | Freq: Two times a day (BID) | INTRAVENOUS | Status: DC
  Administered 2021-03-31 – 2021-04-02 (×4): 3 mL via INTRAVENOUS

## 2021-03-30 MED ORDER — CHLORHEXIDINE GLUCONATE CLOTH 2 % EX PADS
6.0000 | MEDICATED_PAD | Freq: Every day | CUTANEOUS | Status: DC
  Administered 2021-03-30 – 2021-04-02 (×4): 6 via TOPICAL

## 2021-03-30 MED ORDER — ACETAMINOPHEN 160 MG/5ML PO SOLN
1000.0000 mg | Freq: Four times a day (QID) | ORAL | Status: DC
Start: 2021-03-31 — End: 2021-04-04

## 2021-03-30 MED ORDER — DEXMEDETOMIDINE HCL IN NACL 400 MCG/100ML IV SOLN
0.0000 ug/kg/h | INTRAVENOUS | Status: DC
  Filled 2021-03-30: qty 100

## 2021-03-30 MED ORDER — PHENYLEPHRINE HCL-NACL 20-0.9 MG/250ML-% IV SOLN
0.0000 ug/min | INTRAVENOUS | Status: DC
  Administered 2021-03-30: 5 ug/min via INTRAVENOUS
  Filled 2021-03-30: qty 250

## 2021-03-30 MED ORDER — CHLORHEXIDINE GLUCONATE 0.12 % MT SOLN: 15.0000 mL | Freq: Once | OROMUCOSAL | Status: DC

## 2021-03-30 MED ORDER — MIDAZOLAM HCL (PF) 10 MG/2ML IJ SOLN
INTRAMUSCULAR | Status: AC
  Filled 2021-03-30: qty 2

## 2021-03-30 MED ORDER — DOPAMINE-DEXTROSE 3.2-5 MG/ML-% IV SOLN
0.0000 ug/kg/min | INTRAVENOUS | Status: DC
  Filled 2021-03-30: qty 250

## 2021-03-30 MED ORDER — ACETAMINOPHEN 160 MG/5ML PO SOLN: 650.0000 mg | Freq: Once | ORAL | Status: AC

## 2021-03-30 MED ORDER — VANCOMYCIN HCL IN DEXTROSE 1-5 GM/200ML-% IV SOLN
1000.0000 mg | Freq: Once | INTRAVENOUS | Status: AC
  Administered 2021-03-30: 1000 mg via INTRAVENOUS
  Filled 2021-03-30: qty 200

## 2021-03-30 MED ORDER — DOCUSATE SODIUM 100 MG PO CAPS
200.0000 mg | ORAL_CAPSULE | Freq: Every day | ORAL | Status: DC
  Administered 2021-03-31 – 2021-04-02 (×3): 200 mg via ORAL
  Filled 2021-03-30 (×3): qty 2

## 2021-03-30 MED ORDER — SODIUM CHLORIDE 0.9% IV SOLUTION: Freq: Once | INTRAVENOUS | Status: DC

## 2021-03-30 MED ORDER — FAMOTIDINE 20 MG IN NS 100 ML IVPB
20.0000 mg | Freq: Two times a day (BID) | INTRAVENOUS | Status: AC
  Administered 2021-03-30 (×2): 20 mg via INTRAVENOUS
  Filled 2021-03-30 (×2): qty 100

## 2021-03-30 MED ORDER — LIDOCAINE 2% (20 MG/ML) 5 ML SYRINGE: INTRAMUSCULAR | Status: DC | PRN

## 2021-03-30 MED ORDER — ALBUMIN HUMAN 5 % IV SOLN
INTRAVENOUS | Status: AC
  Administered 2021-03-30: 12.5 g via INTRAVENOUS
  Filled 2021-03-30: qty 250

## 2021-03-30 MED ORDER — ONDANSETRON HCL 4 MG/2ML IJ SOLN: 4.0000 mg | Freq: Four times a day (QID) | INTRAMUSCULAR | Status: DC | PRN

## 2021-03-30 MED ORDER — ORAL CARE MOUTH RINSE: 15.0000 mL | Freq: Once | OROMUCOSAL | Status: AC

## 2021-03-30 MED ORDER — PROTAMINE SULFATE 10 MG/ML IV SOLN
INTRAVENOUS | Status: DC | PRN
  Administered 2021-03-30: 270 mg via INTRAVENOUS

## 2021-03-30 MED ORDER — SODIUM CHLORIDE 0.9 % IV SOLN: 1.0000 g | Freq: Once | INTRAVENOUS | Status: DC

## 2021-03-30 MED ORDER — BISACODYL 5 MG PO TBEC
10.0000 mg | DELAYED_RELEASE_TABLET | Freq: Every day | ORAL | Status: DC
  Administered 2021-03-31 – 2021-04-02 (×3): 10 mg via ORAL
  Filled 2021-03-30 (×3): qty 2

## 2021-03-30 MED ORDER — ASPIRIN 81 MG PO CHEW: 324.0000 mg | CHEWABLE_TABLET | Freq: Every day | ORAL | Status: DC

## 2021-03-30 MED ORDER — MORPHINE SULFATE (PF) 2 MG/ML IV SOLN
1.0000 mg | INTRAVENOUS | Status: DC | PRN
  Administered 2021-03-31: 2 mg via INTRAVENOUS
  Filled 2021-03-30: qty 1

## 2021-03-30 MED ORDER — CEFAZOLIN SODIUM-DEXTROSE 2-4 GM/100ML-% IV SOLN
2.0000 g | Freq: Three times a day (TID) | INTRAVENOUS | Status: AC
  Administered 2021-03-30 – 2021-04-01 (×6): 2 g via INTRAVENOUS
  Filled 2021-03-30 (×6): qty 100

## 2021-03-30 MED ORDER — PLASMA-LYTE A IV SOLN
INTRAVENOUS | Status: DC | PRN
  Administered 2021-03-30: 1000 mL

## 2021-03-30 MED ORDER — SODIUM CHLORIDE 0.9 % IV SOLN: INTRAVENOUS | Status: DC

## 2021-03-30 MED ORDER — ROCURONIUM BROMIDE 10 MG/ML (PF) SYRINGE
PREFILLED_SYRINGE | INTRAVENOUS | Status: DC | PRN
  Administered 2021-03-30: 50 mg via INTRAVENOUS
  Administered 2021-03-30: 100 mg via INTRAVENOUS
  Administered 2021-03-30 (×2): 50 mg via INTRAVENOUS

## 2021-03-30 MED ORDER — ZOLPIDEM TARTRATE 5 MG PO TABS
5.0000 mg | ORAL_TABLET | Freq: Every day | ORAL | Status: DC
  Administered 2021-03-30 – 2021-04-03 (×5): 5 mg via ORAL
  Filled 2021-03-30 (×5): qty 1

## 2021-03-30 MED ORDER — SODIUM CHLORIDE 0.9 % IV SOLN: INTRAVENOUS | Status: DC | PRN

## 2021-03-30 MED ORDER — METOPROLOL TARTRATE 25 MG/10 ML ORAL SUSPENSION
12.5000 mg | Freq: Two times a day (BID) | ORAL | Status: DC
  Filled 2021-03-30: qty 5

## 2021-03-30 MED ORDER — FENTANYL CITRATE (PF) 250 MCG/5ML IJ SOLN
INTRAMUSCULAR | Status: DC | PRN
  Administered 2021-03-30 (×2): 150 ug via INTRAVENOUS
  Administered 2021-03-30: 250 ug via INTRAVENOUS
  Administered 2021-03-30: 50 ug via INTRAVENOUS
  Administered 2021-03-30 (×4): 100 ug via INTRAVENOUS
  Administered 2021-03-30: 50 ug via INTRAVENOUS
  Administered 2021-03-30 (×2): 100 ug via INTRAVENOUS

## 2021-03-30 MED ORDER — ~~LOC~~ CARDIAC SURGERY, PATIENT & FAMILY EDUCATION
Freq: Once | Status: DC
Start: 2021-03-30 — End: 2021-03-30
  Filled 2021-03-30: qty 1

## 2021-03-30 MED ORDER — NITROGLYCERIN IN D5W 200-5 MCG/ML-% IV SOLN: 0.0000 ug/min | INTRAVENOUS | Status: DC

## 2021-03-30 MED ORDER — DOPAMINE-DEXTROSE 3.2-5 MG/ML-% IV SOLN
INTRAVENOUS | Status: DC | PRN
  Administered 2021-03-30: 3 ug/kg/min via INTRAVENOUS

## 2021-03-30 MED ORDER — CHLORHEXIDINE GLUCONATE 0.12 % MT SOLN
15.0000 mL | Freq: Once | OROMUCOSAL | Status: AC
  Administered 2021-03-30: 15 mL via OROMUCOSAL
  Filled 2021-03-30: qty 15

## 2021-03-30 SURGICAL SUPPLY — 94 items
ADH SKN CLS APL DERMABOND .7 (GAUZE/BANDAGES/DRESSINGS) ×3
ATRICLIP EXCLUSION VLAA SYSTEM (Miscellaneous) ×4 IMPLANT
BAG DECANTER FOR FLEXI CONT (MISCELLANEOUS) ×4 IMPLANT
BLADE CLIPPER SURG (BLADE) ×4 IMPLANT
BLADE STERNUM SYSTEM 6 (BLADE) ×4 IMPLANT
BLADE SURG 11 STRL SS (BLADE) ×2 IMPLANT
BNDG ELASTIC 4X5.8 VLCR STR LF (GAUZE/BANDAGES/DRESSINGS) ×4 IMPLANT
BNDG ELASTIC 6X5.8 VLCR STR LF (GAUZE/BANDAGES/DRESSINGS) ×4 IMPLANT
BNDG GAUZE ELAST 4 BULKY (GAUZE/BANDAGES/DRESSINGS) ×4 IMPLANT
CANISTER SUCT 3000ML PPV (MISCELLANEOUS) ×4 IMPLANT
CANNULA EZ GLIDE AORTIC 21FR (CANNULA) ×4 IMPLANT
CATH CPB KIT HENDRICKSON (MISCELLANEOUS) ×4 IMPLANT
CATH ROBINSON RED A/P 18FR (CATHETERS) ×4 IMPLANT
CATH THORACIC 36FR (CATHETERS) ×4 IMPLANT
CATH THORACIC 36FR RT ANG (CATHETERS) ×4 IMPLANT
CLIP FOGARTY SPRING 6M (CLIP) ×3 IMPLANT
CLIP TI WIDE RED SMALL 24 (CLIP) ×4 IMPLANT
CONTAINER PROTECT SURGISLUSH (MISCELLANEOUS) ×4 IMPLANT
DEFOGGER ANTIFOG KIT (MISCELLANEOUS) ×1 IMPLANT
DERMABOND ADVANCED (GAUZE/BANDAGES/DRESSINGS) ×1
DERMABOND ADVANCED .7 DNX12 (GAUZE/BANDAGES/DRESSINGS) ×1 IMPLANT
DRAPE CARDIOVASCULAR INCISE (DRAPES) ×4
DRAPE SRG 135X102X78XABS (DRAPES) ×3 IMPLANT
DRAPE WARM FLUID 44X44 (DRAPES) ×4 IMPLANT
DRSG COVADERM 4X14 (GAUZE/BANDAGES/DRESSINGS) ×4 IMPLANT
ELECT REM PT RETURN 9FT ADLT (ELECTROSURGICAL) ×8
ELECTRODE REM PT RTRN 9FT ADLT (ELECTROSURGICAL) ×6 IMPLANT
FELT TEFLON 1X6 (MISCELLANEOUS) ×8 IMPLANT
GAUZE SPONGE 4X4 12PLY STRL (GAUZE/BANDAGES/DRESSINGS) ×8 IMPLANT
GAUZE SPONGE 4X4 12PLY STRL LF (GAUZE/BANDAGES/DRESSINGS) ×2 IMPLANT
GLOVE SURG MICRO LTX SZ6 (GLOVE) ×4 IMPLANT
GLOVE SURG SIGNA 7.5 PF LTX (GLOVE) ×12 IMPLANT
GLOVE SURG UNDER POLY LF SZ9 (GLOVE) ×1 IMPLANT
GOWN STRL REUS W/ TWL LRG LVL3 (GOWN DISPOSABLE) ×12 IMPLANT
GOWN STRL REUS W/ TWL XL LVL3 (GOWN DISPOSABLE) ×6 IMPLANT
GOWN STRL REUS W/TWL LRG LVL3 (GOWN DISPOSABLE) ×16
GOWN STRL REUS W/TWL XL LVL3 (GOWN DISPOSABLE) ×8
HEMOSTAT POWDER SURGIFOAM 1G (HEMOSTASIS) ×12 IMPLANT
HEMOSTAT SURGICEL 2X14 (HEMOSTASIS) ×4 IMPLANT
KIT BASIN OR (CUSTOM PROCEDURE TRAY) ×4 IMPLANT
KIT SUCTION CATH 14FR (SUCTIONS) ×9 IMPLANT
KIT TURNOVER KIT B (KITS) ×4 IMPLANT
KIT VASOVIEW HEMOPRO 2 VH 4000 (KITS) ×4 IMPLANT
MARKER GRAFT CORONARY BYPASS (MISCELLANEOUS) ×12 IMPLANT
NS IRRIG 1000ML POUR BTL (IV SOLUTION) ×20 IMPLANT
PACK E OPEN HEART (SUTURE) ×4 IMPLANT
PACK OPEN HEART (CUSTOM PROCEDURE TRAY) ×4 IMPLANT
PAD ARMBOARD 7.5X6 YLW CONV (MISCELLANEOUS) ×8 IMPLANT
PAD ELECT DEFIB RADIOL ZOLL (MISCELLANEOUS) ×4 IMPLANT
PENCIL BUTTON HOLSTER BLD 10FT (ELECTRODE) ×4 IMPLANT
POSITIONER HEAD DONUT 9IN (MISCELLANEOUS) ×4 IMPLANT
PUNCH AORTIC ROTATE 4.0MM (MISCELLANEOUS) IMPLANT
PUNCH AORTIC ROTATE 4.5MM 8IN (MISCELLANEOUS) ×2 IMPLANT
PUNCH AORTIC ROTATE 5MM 8IN (MISCELLANEOUS) IMPLANT
SET MPS 3-ND DEL (MISCELLANEOUS) ×4 IMPLANT
SUPPORT HEART JANKE-BARRON (MISCELLANEOUS) ×4 IMPLANT
SUT BONE WAX W31G (SUTURE) ×4 IMPLANT
SUT ETHIBOND NAB MH 2-0 36IN (SUTURE) ×1 IMPLANT
SUT MNCRL AB 4-0 PS2 18 (SUTURE) ×2 IMPLANT
SUT PROLENE 2 0 MH 48 (SUTURE) ×4 IMPLANT
SUT PROLENE 3 0 SH DA (SUTURE) ×4 IMPLANT
SUT PROLENE 4 0 RB 1 (SUTURE) ×12
SUT PROLENE 4 0 SH DA (SUTURE) IMPLANT
SUT PROLENE 4-0 RB1 .5 CRCL 36 (SUTURE) IMPLANT
SUT PROLENE 5 0 C 1 36 (SUTURE) ×2 IMPLANT
SUT PROLENE 6 0 C 1 30 (SUTURE) ×10 IMPLANT
SUT PROLENE 7 0 BV1 MDA (SUTURE) ×5 IMPLANT
SUT PROLENE 8 0 BV175 6 (SUTURE) ×15 IMPLANT
SUT SILK  1 MH (SUTURE) ×8
SUT SILK 1 MH (SUTURE) ×2 IMPLANT
SUT SILK 2 0 SH CR/8 (SUTURE) ×2 IMPLANT
SUT STEEL 6MS V (SUTURE) ×4 IMPLANT
SUT STEEL STERNAL CCS#1 18IN (SUTURE) IMPLANT
SUT STEEL SZ 6 DBL 3X14 BALL (SUTURE) ×4 IMPLANT
SUT VIC AB 1 CTX 36 (SUTURE) ×12
SUT VIC AB 1 CTX36XBRD ANBCTR (SUTURE) ×8 IMPLANT
SUT VIC AB 2-0 CT1 27 (SUTURE) ×4
SUT VIC AB 2-0 CT1 TAPERPNT 27 (SUTURE) ×1 IMPLANT
SUT VIC AB 2-0 CTX 27 (SUTURE) IMPLANT
SUT VIC AB 3-0 SH 27 (SUTURE)
SUT VIC AB 3-0 SH 27X BRD (SUTURE) IMPLANT
SUT VICRYL 4-0 PS2 18IN ABS (SUTURE) IMPLANT
SYSTEM EXCLUSION ATRICLIP VLAA (Miscellaneous) IMPLANT
SYSTEM SAHARA CHEST DRAIN ATS (WOUND CARE) ×4 IMPLANT
TAPE CLOTH SURG 4X10 WHT LF (GAUZE/BANDAGES/DRESSINGS) ×1 IMPLANT
TAPE PAPER 2X10 WHT MICROPORE (GAUZE/BANDAGES/DRESSINGS) ×3 IMPLANT
TOWEL GREEN STERILE (TOWEL DISPOSABLE) ×4 IMPLANT
TOWEL GREEN STERILE FF (TOWEL DISPOSABLE) ×4 IMPLANT
TRAY FOLEY SLVR 14FR TEMP STAT (SET/KITS/TRAYS/PACK) ×2 IMPLANT
TRAY FOLEY SLVR 16FR TEMP STAT (SET/KITS/TRAYS/PACK) ×4 IMPLANT
TUBE SUCT INTRACARD DLP 20F (MISCELLANEOUS) ×2 IMPLANT
TUBING LAP HI FLOW INSUFFLATIO (TUBING) ×4 IMPLANT
UNDERPAD 30X36 HEAVY ABSORB (UNDERPADS AND DIAPERS) ×4 IMPLANT
WATER STERILE IRR 1000ML POUR (IV SOLUTION) ×8 IMPLANT

## 2021-03-30 NOTE — Progress Notes (Signed)
Outpatient order

## 2021-03-30 NOTE — Interval H&P Note (Signed)
History and Physical Interval Note:  03/30/2021 7:21 AM  Connor Duffy  has presented today for surgery, with the diagnosis of CAD, Afib.  The various methods of treatment have been discussed with the patient and family. After consideration of risks, benefits and other options for treatment, the patient has consented to  Procedure(s): CORONARY ARTERY BYPASS GRAFTING (CABG) (N/A) TRANSESOPHAGEAL ECHOCARDIOGRAM (TEE) (N/A) CLIPPING OF ATRIAL APPENDAGE (Left) as a surgical intervention.  The patient's history has been reviewed, patient examined, no change in status, stable for surgery.  I have reviewed the patient's chart and labs.  Questions were answered to the patient's satisfaction.     Melrose Nakayama

## 2021-03-30 NOTE — Procedures (Signed)
Extubation Procedure Note  Patient Details:   Name: Connor Duffy DOB: 1940/10/16 MRN: [id number]   Airway Documentation:    Vent end date: 03/30/21 Vent end time: 1743   Evaluation  O2 sats: stable throughout Complications: No apparent complications Patient did tolerate procedure well. Bilateral Breath Sounds: Clear, Diminished   Pt extubated to 4L [place] per rapid wean protocol. NIF was -20 and VC was 1L. Pt had positive cuff leak prior to extubation. No stridor noted.  Vilinda Blanks 03/30/2021, 5:44 PM

## 2021-03-30 NOTE — Op Note (Signed)
NAME: PLUMER, MITTELSTAEDT MEDICAL RECORD NO: [id number] ACCOUNT NO: [id number] DATE OF BIRTH: 28-Jul-1940 FACILITY: [facility] LOCATION: [facility] PHYSICIAN: Revonda Standard. Roxan Hockey, MD  Operative Report   DATE OF PROCEDURE: 03/30/2021  PREOPERATIVE DIAGNOSES:  Three-vessel coronary disease and paroxysmal atrial fibrillation.  POSTOPERATIVE DIAGNOSES:  Three-vessel coronary disease and paroxysmal atrial fibrillation.  PROCEDURE PERFORMED:  Median sternotomy, extracorporeal circulation, coronary artery bypass grafting x4 (left internal mammary artery to LAD, saphenous vein graft to first diagonal, saphenous vein graft to the obtuse marginal 1, saphenous vein graft to  posterior descending), endoscopic vein harvest right leg.  Left atrial appendage clip with AtriCure Flex-V 40 mm clip.  SURGEON: Revonda Standard. Roxan Hockey, MD  ASSISTANT: Ellwood Handler, PA  ANESTHESIA: General  CLINICAL NOTE:  Connor Duffy is a 80 year old man with a history of atrial fibrillation, DVT, hypertension, prostate cancer, arthritis, hyperlipidemia and newly diagnosed coronary disease.  He presented with exertional angina.  A nuclear stress test was  positive.  On catheterization, he was found to have severe 3-vessel disease with total occlusion of the LAD.  Ejection fraction was 60%.  He was offered coronary artery bypass grafting. Given his history of paroxysmal atrial fibrillation, we also  discussed placement of a left atrial appendage clip.  He had previously had an ablation, so it was not felt that a maze procedure would be beneficial.  The patient understood and accepted the risks and agreed to proceed.  OPERATIVE NOTE:  Mr. Dino was brought to the preoperative holding area on 03/30/2021.  Anesthesia placed a Swan-Ganz catheter and arterial blood pressure monitoring line.  He was taken to the operating room and anesthetized and intubated.  A Foley  catheter was placed.  Intravenous antibiotics were administered.  The chest, abdomen and  legs were prepped and draped in the usual sterile fashion.  Transesophageal echocardiography was performed by Dr. Lissa Hoard.  It showed preserved left ventricular  function with no significant valvular pathology.  A timeout was performed.  A median sternotomy was performed and the left internal mammary artery was harvested using standard technique.  Simultaneously, an incision was made in the medial aspect of the right leg at the level of the knee.  The greater  saphenous vein was harvested from the groin to the upper calf endoscopically. Both the vein and mammary artery were good quality vessels.  2000 units was administered during the vessel harvest.  The remainder of the full heparin dose was given prior to  opening the pericardium.  The pericardium was opened.  The ascending aorta was inspected.  It was relatively normal.  The aorta was cannulated via concentric 2-0 Ethibond pledgeted pursestring sutures.  A dual stage venous cannula was placed via a pursestring suture in the right  atrial appendage.  Cardiopulmonary bypass was initiated.  Flows were maintained per protocol.  The patient was cooled to 32 degrees Celsius.  The coronary arteries were inspected.  Anastomotic sites were chosen.  The conduits were inspected and cut to  length.  A foam pad was placed in the pericardium to insulate the heart.  A temperature probe was placed in the myocardial septum and a cardioplegia cannula was placed in the ascending aorta.  The aorta was cross clamped.  The left ventricle was emptied via the aortic root vent.  Cardiac arrest then was achieved with a combination of cold antegrade blood cardioplegia and topical iced saline.  After achieving a complete diastolic arrest and  septal cooling to 10 degrees Celsius with 1.5 liters of cardioplegia,  the following distal anastomoses were performed.  A reversed saphenous vein graft was placed end-to-side to the first diagonal branch of the LAD.  This was a 1.5 mm  good quality target. Vein was anastomosed end-to-side with a running 7-0 Prolene suture.  All anastomoses were probed proximally and distally  at their completion to ensure patency.  Cardioplegia was administered down the graft and there was good flow and good hemostasis.  Additional cardioplegia was administered.  A reversed saphenous vein graft was placed end-to-side to the posterior descending branch of the right coronary.  This was a 1.5 mm good quality target.  The vein was good quality.  The anastomosis was performed  with a running 7-0 Prolene suture.  A probe passed easily proximally and distally.  Cardioplegia was administered.  There was good flow and good hemostasis.  A reversed saphenous vein graft then was placed end-to-side to obtuse marginal 1.  This was a 1.5 mm good quality target vessel.  The vein was of good quality.  It was anastomosed end-to-side with a running 7-0 Prolene suture.  A probe passed easily  proximally and distally.  Cardioplegia was administered.  There was good flow and good hemostasis.    The left atrial appendage was measured for a 40 mm Flex-V clip and that was placed at the base of the appendage without difficulty.  The left internal mammary artery was brought through a window in the pericardium.  The distal end was beveled.  It was anastomosed end-to-side to the distal LAD.  The LAD was a 1.5 mm fair quality target.  The probe did pass to the apex distally.  The  mammary was a good quality conduit, it was anastomosed end-to-side with a running 8-0 Prolene suture.  At the completion of the anastomosis, the bulldog clamp was briefly removed. Rapid septal rewarming was noted.  The bulldog clamp was replaced.  The  mammary pedicle was tacked to the epicardial surface of the heart with 6-0 Prolene sutures.  Additional cardioplegia was administered. The vein grafts were cut to length.  The cardioplegia cannula was removed from the ascending aorta.  The proximal vein  graft anastomoses were performed to 4.5 mm punch aortotomies with running 6-0 Prolene  sutures.  At the completion of the final proximal anastomosis, the patient was placed in Trendelenburg position.  Lidocaine was administered.  The aortic root was de-aired and the aortic crossclamp was removed.  The crossclamp time was 73 minutes.  On inspecting the proximal anastomoses, there was large amount of blood coming from inferiorly.  Inspection revealed a large leak at the heel of the posterior descending vein.  Rather than attempt to repair this rather large tear, the decision was made to rearrest the heart and redo the anastomosis.  A new cardioplegia cannula was placed via a pursestring suture that was pledgeted.  The aorta was again cross-clamped.  A bulldog clamp was placed on the left mammary.  1 liter of cardioplegia was administered.  There was a diastolic arrest. The vein to  posterior descending anastomosis was taken down and then reperformed with a running 7-0 Prolene suture.  Again, the probe passed easily proximally and distally.  The graft was deaired.  The aortic crossclamp was removed.  The second clamp time was 11 minutes.  There was good hemostasis at the anastomosis. All the remaining anastomoses were inspected for hemostasis.  Epicardial pacing wires were placed on the right ventricle and right atrium.  When the patient rewarmed to a  core  temperature of 37 degrees Celsius, he was weaned from cardiopulmonary bypass on the first attempt.  He weaned from bypass without inotropic support.  His initial cardiac index was low, but it improved rapidly with volume administration.   Transesophageal echocardiography showed preserved left ventricular wall motion.  A test dose of protamine was administered and was well tolerated.  The atrial and aortic cannula were removed.  The remainder of the protamine was administered without  incident.  The chest was irrigated with warm saline.  Hemostasis was  achieved.  Left pleural and mediastinal chest tubes were placed through separate subcostal incisions.  Sternum was closed with a combination of single and double heavy gauge stainless  steel wires.  The pectoralis fascia, subcutaneous tissue and skin were closed in standard fashion.  It should be noted after closure of the chest, the cardiac output dropped without any other changes being noted in terms of rate, rhythm, pulmonary artery  pressures or mean arterial pressure.  The patient was started on low dose dopamine infusion for stabilization for transport to the surgical intensive care unit.  All sponge, needle and instrument counts were correct at the end of the procedure, the  patient was taken from the operating room to the surgical intensive care unit in good condition.   SHW D: 03/30/2021 6:32:05 pm T: 03/30/2021 11:52:00 pm  JOB: [id number]/ [id number]

## 2021-03-30 NOTE — Hospital Course (Addendum)
History of Present Illness:  Connor Duffy is a 80 year old man with a past medical history significant for atrial fibrillation, DVT, hypertension, prostate cancer, arthritis, left total knee replacement, hyperlipidemia, and newly diagnosed coronary disease.  He had an ablation for A. fib in May 2020.  Recently he has been experiencing chest tightness and palpitations with exertion.  He works at [employer] about 5 days a week.  He mainly does elliptical or bike work because of knee issues.  Recently about 5 minutes into his workout he will experience tightness in his chest and shortness of breath.  He also feels like his heart is going to beat out of his chest.  He thought he was having episodes of atrial fibrillation.  He had a nuclear stress test which showed reversible ischemia of the large size in the mid to distal anterior, anteroseptal, and apical walls.  Ejection fraction was 60%.  He then underwent cardiac catheterization where he was found to have chronic total occlusion of his LAD, 75% stenosis in the mid circumflex, and diffuse disease in the right coronary.   It was felt the patient would best be treated with Coronary bypass grafting.  He was referred to [facility] for evaluation.  He was evaluated by Dr. Roxan Hockey who felt patient would benefit from coronary bypass grafting procedure with clipping of his left atrial appendage.  The risks and benefits of the procedure were explained to the patient and he was agreeable to proceed.  Hospital Course:  Connor Duffy presented to [facility] on 03/30/2021.  He was taken to the operating room and underwent CABG x 4 utilizing LIMA to LAD, SVG to Diagonal, SVG to OM, and SVG to PDA.  He also underwent clipping of LA appendage with a size 40 mm Atricure Flex Clip.  Finally, he had endoscopic harvest of greater saphenous vein from his right leg.  He tolerated the procedure without difficulty and was taken to the SICU in stable  condition.  He remained hemodynamically stable.  He was weaned from the ventilator per protocol and extubated by 6 PM on the day of surgery.  He developed atrial fibrillation with RVR on the morning of the first postoperative day.  Amiodarone infusion was initiated.  He converted back to sinus rhythm by midday on postop day 1.  He had low blood pressure and was treated with a 500 ml NS bolus.  He developed mild gastric dilation which was treated with reglan and slow diet advancement.  He developed mild AKI with creatinine level peaking at 1.46.  The patient's chest tubes were removed on 04/01/2021.  The patient converted back into rate controlled Atrial Fibrillation.  This is chronic for the patient.  His pacing wires were removed without difficulty.  He was restarted on his home regimen of Pradaxa for stroke prevention.  He remained hypotensive, thus addition ACE/ARB could not be instituted.  He was felt stable for transfer to the progressive care unit on 04/02/2021. He had a lot of diarrhea on 09/29 after getting a laxative;this did stop. His a fib rate was not well controlled on 09/30. Lopressor was increased to 25 mg bid and he was restarted on Pradaxa on 09/30  Patient has no history of diabetes and pre op HGA1C is 6. Accu checks and sliding scale have been stopped. He has been ambulating on room air with good oxygenation. He is tolerating a diet. His wounds are clean, dry, and healing without signs of infection. Thrombocytopenia  did resolve as last platelet count was up to 206,000. CXR on 09/30 did show elevation of the left hemi diaphragm.  The patient continued to have some issues with his atrial fibrillation and rate requiring ongoing medication adjustments.  At time of discharge the patient has been resumed on his home Cardizem and amiodarone has been stopped.  It is noted he failed Tikosyn previously.  Clinically, he continued to do quite well and his recovery.  Oxygen was weaned and he maintains good  saturations on room air.  Incisions were noted to be healing well without evidence of infection.  He was tolerating diet.  He was tolerating ambulation and cardiac rehab modalities.  At the time of discharge patient was felt to be quite stable.

## 2021-03-30 NOTE — Anesthesia Procedure Notes (Signed)
Central Venous Catheter Insertion Performed by: Nolon Nations, MD, anesthesiologist Start/End9/26/2022 7:10 AM, 03/30/2021 7:15 AM Patient location: Pre-op. Preanesthetic checklist: patient identified, IV checked, site marked, risks and benefits discussed, surgical consent, monitors and equipment checked, pre-op evaluation, timeout performed and anesthesia consent Position: Trendelenburg Hand hygiene performed  and maximum sterile barriers used  PA cath was placed.Swan type:thermodilution PA Cath depth:47 Procedure performed without using ultrasound guided technique. Attempts: 1 Post procedure assessment: free fluid flow and no air  Patient tolerated the procedure well with no immediate complications.

## 2021-03-30 NOTE — Brief Op Note (Signed)
03/30/2021  6:22 PM  PATIENT:  Connor Duffy  80 y.o. male  PRE-OPERATIVE DIAGNOSIS:  Coronary Artery Disease, Afib  POST-OPERATIVE DIAGNOSIS:  Coronary Artery Disease, Afib  PROCEDURE:  Procedure(s):  CORONARY ARTERY BYPASS GRAFTING x 4 -LIMA to LAD -SVG to DIAGONAL -SVG to OM -SVG to PDA  CLIPPING OF ATRIAL APPENDAGE (Left) -40 mm Atricure Flex Clip  ENDOSCOPIC HARVEST OF GREATER SAPHENOUS VEIN -Right Leg (27/15 min)  TRANSESOPHAGEAL ECHOCARDIOGRAM (TEE) (N/A)  SURGEON:  Surgeon(s) and Role:    [place] Nakayama, MD - Primary  PHYSICIAN ASSISTANT: Ellwood Handler PA-C  ASSISTANTS: Dineen Kid RNFA   ANESTHESIA:   general  EBL:  775 mL   BLOOD ADMINISTERED:   CC CELLSAVER  DRAINS:  Left Pleural Chest Tube, Mediastinal Chest Drains    LOCAL MEDICATIONS USED:  NONE  SPECIMEN:  No Specimen  DISPOSITION OF SPECIMEN:  N/A  COUNTS:  YES  TOURNIQUET:  * No tourniquets in log *  DICTATION: .Dragon Dictation  PLAN OF CARE: Admit to inpatient   PATIENT DISPOSITION:  ICU - intubated and hemodynamically stable.   Delay start of Pharmacological VTE agent (>24hrs) due to surgical blood loss or risk of bleeding: yes

## 2021-03-30 NOTE — Transfer of Care (Signed)
Immediate Anesthesia Transfer of Care Note  Patient: Connor Duffy  Procedure(s) Performed: CORONARY ARTERY BYPASS GRAFTING (CABG), ON PUMP, TIMES FOUR, USING LEFT INTERNAL MAMMARY ARTERY AND RIGHT ENDOSCOPICALLY HARVESTED GREATER SAPHENOUS VEIN (Chest) TRANSESOPHAGEAL ECHOCARDIOGRAM (TEE) CLIPPING OF ATRIAL USING ATRICURE 40MM CLIP (Left)  Patient Location: PACU  Anesthesia Type:General  Level of Consciousness: Patient remains intubated per anesthesia plan  Airway & Oxygen Therapy: Patient remains intubated per anesthesia plan and Patient placed on Ventilator (see vital sign flow sheet for setting)  Post-op Assessment: Report given to RN and Post -op Vital signs reviewed and stable  Post vital signs: Reviewed and stable  Last Vitals:  Vitals Value Taken Time  BP 135/99 03/30/21 1357  Temp 35.4 C 03/30/21 1400  Pulse 80 03/30/21 1359  Resp 11 03/30/21 1400  SpO2 98 % 03/30/21 1359  Vitals shown include unvalidated device data.  Last Pain:  Vitals:   03/30/21 0630  TempSrc:   PainSc: 0-No pain         Complications: No notable events documented.

## 2021-03-30 NOTE — Discharge Instructions (Signed)

## 2021-03-30 NOTE — Anesthesia Procedure Notes (Addendum)
Arterial Line Insertion Start/End9/26/2022 6:45 AM, 03/30/2021 7:00 AM Performed by: Reece Agar, CRNA  Patient location: Pre-op. Preanesthetic checklist: patient identified, IV checked, site marked, risks and benefits discussed, surgical consent, monitors and equipment checked, pre-op evaluation, timeout performed and anesthesia consent Lidocaine 1% used for infiltration Right, radial was placed Catheter size: 20 G Hand hygiene performed , maximum sterile barriers used  and Seldinger technique used Allen's test indicative of satisfactory collateral circulation Attempts: 1 Procedure performed without using ultrasound guided technique. Following insertion, dressing applied and Biopatch. Post procedure assessment: normal and unchanged  Patient tolerated the procedure well with no immediate complications. Additional procedure comments: Inserted by Justin Mend, SRNA under the supervision of CRNA and MDA.

## 2021-03-30 NOTE — Anesthesia Procedure Notes (Addendum)
Procedure Name: Intubation Date/Time: 03/30/2021 7:57 AM Performed by: Justin Mend, RN Pre-anesthesia Checklist: Patient identified, Emergency Drugs available, Suction available and Patient being monitored Patient Re-evaluated:Patient Re-evaluated prior to induction Oxygen Delivery Method: Circle System Utilized Preoxygenation: Pre-oxygenation with 100% oxygen Induction Type: IV induction Ventilation: Mask ventilation without difficulty Laryngoscope Size: Mac and 4 Grade View: Grade I Tube type: Oral Tube size: 8.0 mm Number of attempts: 1 Airway Equipment and Method: Stylet Placement Confirmation: ETT inserted through vocal cords under direct vision, positive ETCO2 and breath sounds checked- equal and bilateral Secured at: 23 cm Tube secured with: Tape Dental Injury: Teeth and Oropharynx as per pre-operative assessment  Comments: Performed by Justin Mend, SRNA under the supervision of CRNA and MDA

## 2021-03-30 NOTE — Anesthesia Procedure Notes (Signed)
Central Venous Catheter Insertion Performed by: Nolon Nations, MD, anesthesiologist Start/End9/26/2022 6:55 AM, 03/30/2021 7:10 AM Patient location: Pre-op. Preanesthetic checklist: patient identified, IV checked, site marked, risks and benefits discussed, surgical consent, monitors and equipment checked, pre-op evaluation, timeout performed and anesthesia consent Position: Trendelenburg Lidocaine 1% used for infiltration and patient sedated Hand hygiene performed  and maximum sterile barriers used  Catheter size: 8.5 Fr Sheath introducer Procedure performed using ultrasound guided technique. Ultrasound Notes:anatomy identified, needle tip was noted to be adjacent to the nerve/plexus identified, no ultrasound evidence of intravascular and/or intraneural injection and image(s) printed for medical record Attempts: 1 Following insertion, line sutured, dressing applied and Biopatch. Post procedure assessment: blood return through all ports, free fluid flow and no air  Patient tolerated the procedure well with no immediate complications.

## 2021-03-30 NOTE — Progress Notes (Signed)
      [address]       [place] [address]             [phone]      Extubated, no complaints  BP 111/88   Pulse 80   Temp 97.9 F (36.6 C)   Resp 16   Ht 5\' 11"  (1.803 m)   Wt 86.2 kg   SpO2 100%   BMI 26.50 kg/m  25/15 CI 2.1 on dopamine at 3   Intake/Output Summary (Last 24 hours) at 03/30/2021 1800 Last data filed at 03/30/2021 1700 Gross per 24 hour  Intake 4391.78 ml  Output 3170 ml  Net 1221.78 ml  Minimal CT output  Hct 32, K= 5.3  Doing well s/p CABG  Remo Lipps C. Roxan Hockey, MD [facility] [phone]

## 2021-03-30 NOTE — Progress Notes (Signed)
  Echocardiogram Echocardiogram Transesophageal has been performed.  Connor Duffy 03/30/2021, 8:39 AM

## 2021-03-30 NOTE — Anesthesia Postprocedure Evaluation (Addendum)
Anesthesia Post Note  Patient: Connor Duffy  Procedure(s) Performed: CORONARY ARTERY BYPASS GRAFTING (CABG), ON PUMP, TIMES FOUR, USING LEFT INTERNAL MAMMARY ARTERY AND RIGHT ENDOSCOPICALLY HARVESTED GREATER SAPHENOUS VEIN (Chest) TRANSESOPHAGEAL ECHOCARDIOGRAM (TEE) CLIPPING OF ATRIAL USING ATRICURE 40MM CLIP (Left)     Patient location during evaluation: SICU Anesthesia Type: General Level of consciousness: sedated and patient remains intubated per anesthesia plan Pain management: pain level controlled Vital Signs Assessment: post-procedure vital signs reviewed and stable Respiratory status: patient on ventilator - see flowsheet for VS and patient remains intubated per anesthesia plan Cardiovascular status: stable Anesthetic complications: no   No notable events documented.  Last Vitals:  Vitals:   03/30/21 1715 03/30/21 1730  BP: (!) 83/67 104/80  Pulse: 80 80  Resp: 13 13  Temp: 36.5 C 36.5 C  SpO2: 100% 100%    Last Pain:  Vitals:   03/30/21 1600  TempSrc: Core  PainSc:                  Nolon Nations

## 2021-03-31 ENCOUNTER — Inpatient Hospital Stay (HOSPITAL_COMMUNITY): Payer: No Typology Code available for payment source

## 2021-03-31 ENCOUNTER — Encounter (HOSPITAL_COMMUNITY): Payer: Self-pay | Admitting: Thoracic Surgery (Cardiothoracic Vascular Surgery)

## 2021-03-31 LAB — CBC
HCT: 39.5 % (ref 39.0–52.0)
HCT: 28.7 % — ABNORMAL LOW (ref 39.0–52.0)
HCT: 29.8 % — ABNORMAL LOW (ref 39.0–52.0)
Hemoglobin: 12.4 g/dL — ABNORMAL LOW (ref 13.0–17.0)
Hemoglobin: 9.9 g/dL — ABNORMAL LOW (ref 13.0–17.0)
Hemoglobin: 10.2 g/dL — ABNORMAL LOW (ref 13.0–17.0)
MCH: 33.1 pg (ref 26.0–34.0)
MCH: 32 pg (ref 26.0–34.0)
MCH: 32.2 pg (ref 26.0–34.0)
MCHC: 31.4 g/dL (ref 30.0–36.0)
MCHC: 34.5 g/dL (ref 30.0–36.0)
MCHC: 34.2 g/dL (ref 30.0–36.0)
MCV: 105.3 fL — ABNORMAL HIGH (ref 80.0–100.0)
MCV: 92.9 fL (ref 80.0–100.0)
MCV: 94 fL (ref 80.0–100.0)
Platelets: 193 10*3/uL (ref 150–400)
Platelets: 137 10*3/uL — ABNORMAL LOW (ref 150–400)
Platelets: 142 10*3/uL — ABNORMAL LOW (ref 150–400)
RBC: 3.75 MIL/uL — ABNORMAL LOW (ref 4.22–5.81)
RBC: 3.09 MIL/uL — ABNORMAL LOW (ref 4.22–5.81)
RBC: 3.17 MIL/uL — ABNORMAL LOW (ref 4.22–5.81)
RDW: 14.5 % (ref 11.5–15.5)
RDW: 13.8 % (ref 11.5–15.5)
RDW: 13.9 % (ref 11.5–15.5)
WBC: 8.2 10*3/uL (ref 4.0–10.5)
WBC: 9.1 10*3/uL (ref 4.0–10.5)
WBC: 11.6 10*3/uL — ABNORMAL HIGH (ref 4.0–10.5)
nRBC: 0.2 % (ref 0.0–0.2)
nRBC: 0 % (ref 0.0–0.2)
nRBC: 0 % (ref 0.0–0.2)

## 2021-03-31 LAB — COMPREHENSIVE METABOLIC PANEL
ALT: 14 U/L (ref 0–44)
AST: 42 U/L — ABNORMAL HIGH (ref 15–41)
Albumin: 3.3 g/dL — ABNORMAL LOW (ref 3.5–5.0)
Alkaline Phosphatase: 42 U/L (ref 38–126)
Anion gap: 5 (ref 5–15)
BUN: 13 mg/dL (ref 8–23)
CO2: 21 mmol/L — ABNORMAL LOW (ref 22–32)
Calcium: 7.7 mg/dL — ABNORMAL LOW (ref 8.9–10.3)
Chloride: 102 mmol/L (ref 98–111)
Creatinine, Ser: 0.98 mg/dL (ref 0.61–1.24)
GFR, Estimated: >60 mL/min (ref >60)
Glucose, Bld: 127 mg/dL — ABNORMAL HIGH (ref 70–99)
Potassium: 4.1 mmol/L (ref 3.5–5.1)
Sodium: 128 mmol/L — ABNORMAL LOW (ref 135–145)
Total Bilirubin: 1 mg/dL (ref 0.3–1.2)
Total Protein: 5.1 g/dL — ABNORMAL LOW (ref 6.5–8.1)

## 2021-03-31 LAB — BASIC METABOLIC PANEL
Anion gap: 3 — ABNORMAL LOW (ref 5–15)
Anion gap: 8 (ref 5–15)
BUN: 16 mg/dL (ref 8–23)
BUN: 16 mg/dL (ref 8–23)
CO2: 39 mmol/L — ABNORMAL HIGH (ref 22–32)
CO2: 22 mmol/L (ref 22–32)
Calcium: 8.4 mg/dL — ABNORMAL LOW (ref 8.9–10.3)
Calcium: 7.6 mg/dL — ABNORMAL LOW (ref 8.9–10.3)
Chloride: 93 mmol/L — ABNORMAL LOW (ref 98–111)
Chloride: 99 mmol/L (ref 98–111)
Creatinine, Ser: 0.66 mg/dL (ref 0.61–1.24)
Creatinine, Ser: 1.16 mg/dL (ref 0.61–1.24)
GFR, Estimated: >60 mL/min (ref >60)
GFR, Estimated: >60 mL/min (ref >60)
Glucose, Bld: 153 mg/dL — ABNORMAL HIGH (ref 70–99)
Glucose, Bld: 153 mg/dL — ABNORMAL HIGH (ref 70–99)
Potassium: 4.3 mmol/L (ref 3.5–5.1)
Potassium: 4.4 mmol/L (ref 3.5–5.1)
Sodium: 135 mmol/L (ref 135–145)
Sodium: 129 mmol/L — ABNORMAL LOW (ref 135–145)

## 2021-03-31 LAB — GLUCOSE, CAPILLARY
Glucose-Capillary: 122 mg/dL — ABNORMAL HIGH (ref 70–99)
Glucose-Capillary: 81 mg/dL (ref 70–99)
Glucose-Capillary: 118 mg/dL — ABNORMAL HIGH (ref 70–99)
Glucose-Capillary: 116 mg/dL — ABNORMAL HIGH (ref 70–99)
Glucose-Capillary: 113 mg/dL — ABNORMAL HIGH (ref 70–99)
Glucose-Capillary: 129 mg/dL — ABNORMAL HIGH (ref 70–99)
Glucose-Capillary: 140 mg/dL — ABNORMAL HIGH (ref 70–99)
Glucose-Capillary: 148 mg/dL — ABNORMAL HIGH (ref 70–99)
Glucose-Capillary: 140 mg/dL — ABNORMAL HIGH (ref 70–99)

## 2021-03-31 LAB — MAGNESIUM
Magnesium: 2.2 mg/dL (ref 1.7–2.4)
Magnesium: 2.3 mg/dL (ref 1.7–2.4)
Magnesium: 2.1 mg/dL (ref 1.7–2.4)

## 2021-03-31 MED ORDER — FUROSEMIDE 10 MG/ML IJ SOLN
40.0000 mg | Freq: Once | INTRAMUSCULAR | Status: AC
  Administered 2021-03-31: 40 mg via INTRAVENOUS
  Filled 2021-03-31: qty 4

## 2021-03-31 MED ORDER — POTASSIUM CHLORIDE 10 MEQ/50ML IV SOLN
10.0000 meq | INTRAVENOUS | Status: AC
Start: 2021-03-31 — End: 2021-03-31
  Administered 2021-03-31 (×3): 10 meq via INTRAVENOUS
  Filled 2021-03-31: qty 50

## 2021-03-31 MED ORDER — INSULIN ASPART 100 UNIT/ML IJ SOLN
0.0000 [IU] | INTRAMUSCULAR | Status: DC
  Administered 2021-03-31 – 2021-04-01 (×4): 2 [IU] via SUBCUTANEOUS

## 2021-03-31 MED ORDER — AMIODARONE HCL IN DEXTROSE 360-4.14 MG/200ML-% IV SOLN
60.0000 mg/h | INTRAVENOUS | Status: AC
  Administered 2021-03-31 (×2): 60 mg/h via INTRAVENOUS
  Filled 2021-03-31 (×2): qty 200

## 2021-03-31 MED ORDER — AMIODARONE HCL IN DEXTROSE 360-4.14 MG/200ML-% IV SOLN
30.0000 mg/h | INTRAVENOUS | Status: AC
  Administered 2021-03-31 (×2): 30 mg/h via INTRAVENOUS
  Filled 2021-03-31: qty 200

## 2021-03-31 MED ORDER — METOCLOPRAMIDE HCL 5 MG/ML IJ SOLN
10.0000 mg | Freq: Four times a day (QID) | INTRAMUSCULAR | Status: AC
  Administered 2021-03-31 – 2021-04-01 (×4): 10 mg via INTRAVENOUS
  Filled 2021-03-31 (×4): qty 2

## 2021-03-31 MED ORDER — AMIODARONE LOAD VIA INFUSION
150.0000 mg | Freq: Once | INTRAVENOUS | Status: AC
  Administered 2021-03-31: 150 mg via INTRAVENOUS
  Filled 2021-03-31: qty 83.34

## 2021-03-31 MED ORDER — INSULIN DETEMIR 100 UNIT/ML ~~LOC~~ SOLN
10.0000 [IU] | Freq: Two times a day (BID) | SUBCUTANEOUS | Status: AC
  Administered 2021-03-31 (×2): 10 [IU] via SUBCUTANEOUS
  Filled 2021-03-31 (×2): qty 0.1

## 2021-03-31 MED ORDER — SIMETHICONE 80 MG PO CHEW
160.0000 mg | CHEWABLE_TABLET | Freq: Four times a day (QID) | ORAL | Status: DC | PRN
  Administered 2021-03-31 (×2): 160 mg via ORAL
  Filled 2021-03-31 (×2): qty 2

## 2021-03-31 MED ORDER — ENOXAPARIN SODIUM 40 MG/0.4ML IJ SOSY
40.0000 mg | PREFILLED_SYRINGE | Freq: Every day | INTRAMUSCULAR | Status: DC
  Administered 2021-03-31: 40 mg via SUBCUTANEOUS
  Filled 2021-03-31: qty 0.4

## 2021-03-31 MED FILL — Heparin Sodium (Porcine) Inj 1000 Unit/ML: Qty: 1000 | Status: AC

## 2021-03-31 MED FILL — Magnesium Sulfate Inj 50%: INTRAMUSCULAR | Qty: 10 | Status: AC

## 2021-03-31 MED FILL — Potassium Chloride Inj 2 mEq/ML: INTRAVENOUS | Qty: 40 | Status: AC

## 2021-03-31 NOTE — Progress Notes (Addendum)
[address]       [place] [address]             [phone]      1 Day Post-Op  Procedure(s) (LRB): CORONARY ARTERY BYPASS GRAFTING (CABG), ON PUMP, TIMES FOUR, USING LEFT INTERNAL MAMMARY ARTERY AND RIGHT ENDOSCOPICALLY HARVESTED GREATER SAPHENOUS VEIN (N/A) TRANSESOPHAGEAL ECHOCARDIOGRAM (TEE) (N/A) CLIPPING OF ATRIAL USING ATRICURE 40MM CLIP (Left)   Total Length of Stay:  LOS: 1 day    SUBJECTIVE: On dopamine most of the morning due to hypotension but this has been turned off since he converted back to SR and BP improved.   Vitals:   03/31/21 1445 03/31/21 1500  BP: 113/82 110/60  Pulse: 80 79  Resp: (!) 22 20  Temp: 99.5 F (37.5 C) 99.5 F (37.5 C)  SpO2: 93% 100%    Intake/Output      09/26 0701 09/27 0700 09/27 0701 09/28 0700   P.O. 780    I.V. (mL/kg) 3737.2 (43.4)    Blood 413    IV Piggyback 1540.2    Total Intake(mL/kg) 6470.4 (75.1)    Urine (mL/kg/hr) 4370 (2.1) 1250 (1.7)   Blood 775    Chest Tube 390 300   Total Output 5535 1550   Net +935.4 -1550            sodium chloride Stopped (03/30/21 1621)   sodium chloride     sodium chloride 10 mL/hr at 03/30/21 1419   amiodarone 30 mg/hr (03/31/21 1423)    ceFAZolin (ANCEF) IV 2 g (03/31/21 1412)   dexmedetomidine (PRECEDEX) IV infusion Stopped (03/30/21 1727)   DOPamine 3 mcg/kg/min (03/31/21 0700)   insulin Stopped (03/31/21 1152)   lactated ringers     lactated ringers 20 mL/hr at 03/31/21 0700   lactated ringers 20 mL/hr at 03/30/21 1700   nitroGLYCERIN 10 mcg/min (03/31/21 0700)   phenylephrine (NEO-SYNEPHRINE) Adult infusion Stopped (03/30/21 1753)    CBC    Component Value Date/Time   WBC 9.1 03/31/2021 0600   RBC 3.09 (L) 03/31/2021 0600   HGB 9.9 (L) 03/31/2021 0600   HCT 28.7 (L) 03/31/2021 0600   PLT 137 (L) 03/31/2021 0600   MCV 92.9 03/31/2021 0600   MCH 32.0 03/31/2021 0600   MCHC 34.5 03/31/2021 0600   RDW 13.8 03/31/2021 0600   LYMPHSABS  1.1 03/05/2020 1612   MONOABS 0.6 03/05/2020 1612   EOSABS 0.1 03/05/2020 1612   BASOSABS 0.0 03/05/2020 1612   CMP     Component Value Date/Time   NA 128 (L) 03/31/2021 0600   K 4.1 03/31/2021 0600   CL 102 03/31/2021 0600   CO2 21 (L) 03/31/2021 0600   GLUCOSE 127 (H) 03/31/2021 0600   BUN 13 03/31/2021 0600   CREATININE 0.98 03/31/2021 0600   CALCIUM 7.7 (L) 03/31/2021 0600   PROT 5.1 (L) 03/31/2021 0600   ALBUMIN 3.3 (L) 03/31/2021 0600   AST 42 (H) 03/31/2021 0600   ALT 14 03/31/2021 0600   ALKPHOS 42 03/31/2021 0600   BILITOT 1.0 03/31/2021 0600   GFRNONAA >60 03/31/2021 0600   GFRAA >60 03/05/2020 1612   ABG    Component Value Date/Time   PHART 7.308 (L) 03/30/2021 1843   PCO2ART 47.5 03/30/2021 1843   PO2ART 80 (L) 03/30/2021 1843   HCO3 23.8 03/30/2021 1843   TCO2 25 03/30/2021 1843   ACIDBASEDEF 3.0 (H) 03/30/2021 1843   O2SAT 94.0 03/30/2021 1843   CBG (last  3)  Recent Labs    03/31/21 0746 03/31/21 0929 03/31/21 1149  GLUCAP 113* 129* 140*     ASSESSMENT: Stable day. Converted from AF back to NSR after amio load started. BP stable off DA.  RN preparing to remove chest tubes, PA cath, and arterial line now. Out of bed later this evening.    Antony Odea, PA-C     Doing well.   Pt dumped 450 from CT while standing Will keep tubes for now  [employer]

## 2021-03-31 NOTE — Care Management (Signed)
1354 03-31-21 Case Manager spoke with the patients wife and the [facility] [place] is aware that the patient is hospitalized. Family has the [place] authorization number. Case Manager will follow for additional transition of care needs.

## 2021-03-31 NOTE — Progress Notes (Signed)
1 Day Post-Op Procedure(s) (LRB): CORONARY ARTERY BYPASS GRAFTING (CABG), ON PUMP, TIMES FOUR, USING LEFT INTERNAL MAMMARY ARTERY AND RIGHT ENDOSCOPICALLY HARVESTED GREATER SAPHENOUS VEIN (N/A) TRANSESOPHAGEAL ECHOCARDIOGRAM (TEE) (N/A) CLIPPING OF ATRIAL USING ATRICURE 40MM CLIP (Left) Subjective: C/o "gas pain" denies nausea  Objective: Vital signs in last 24 hours: Temp:  [95.2 F (35.1 C)-100.2 F (37.9 C)] 99.5 F (37.5 C) (09/27 0700) Pulse Rate:  [73-195] 195 (09/27 0700) Resp:  [11-22] 18 (09/27 0700) BP: (83-135)/(67-99) 98/70 (09/27 0700) SpO2:  [76 %-100 %] 97 % (09/27 0700) Arterial Line BP: (95-156)/(46-89) 136/67 (09/27 0700) FiO2 (%):  [40 %-50 %] 40 % (09/26 1714)  Hemodynamic parameters for last 24 hours: PAP: (22-31)/(14-24) 27/22 CO:  [2.1 L/min] 2.1 L/min CI:  [1 L/min/m2] 1 L/min/m2  Intake/Output from previous day: 09/26 0701 - 09/27 0700 In: 6470.4 [P.O.:780; I.V.:3737.2; Blood:413; IV Piggyback:1540.2] Out: 1443 [XVQMG:8676; Blood:775; Chest Tube:390] Intake/Output this shift: Total I/O In: -  Out: 200 [Chest Tube:200]  General appearance: alert, cooperative, and no distress Neurologic: intact Heart: irregularly irregular rhythm Lungs: diminished breath sounds left base Abdomen: mildly distended, tympanitic  Lab Results: Recent Labs    03/31/21 0447 03/31/21 0600  WBC 8.2 9.1  HGB 12.4* 9.9*  HCT 39.5 28.7*  PLT 193 137*   BMET:  Recent Labs    03/31/21 0447 03/31/21 0600  NA 135 128*  K 4.3 4.1  CL 93* 102  CO2 39* 21*  GLUCOSE 153* 127*  BUN 16 13  CREATININE 0.66 0.98  CALCIUM 8.4* 7.7*    PT/INR:  Recent Labs    03/30/21 1406  LABPROT 16.7*  INR 1.4*   ABG    Component Value Date/Time   PHART 7.308 (L) 03/30/2021 1843   HCO3 23.8 03/30/2021 1843   TCO2 25 03/30/2021 1843   ACIDBASEDEF 3.0 (H) 03/30/2021 1843   O2SAT 94.0 03/30/2021 1843   CBG (last 3)  Recent Labs    03/31/21 0304 03/31/21 0504  03/31/21 0746  GLUCAP 118* 116* 113*    Assessment/Plan: S/P Procedure(s) (LRB): CORONARY ARTERY BYPASS GRAFTING (CABG), ON PUMP, TIMES FOUR, USING LEFT INTERNAL MAMMARY ARTERY AND RIGHT ENDOSCOPICALLY HARVESTED GREATER SAPHENOUS VEIN (N/A) TRANSESOPHAGEAL ECHOCARDIOGRAM (TEE) (N/A) CLIPPING OF ATRIAL USING ATRICURE 40MM CLIP (Left) POD # 1 CABG, LAA clip CV- hemodynamics are good- dc Swan and A line  Wean dopamine, nitroglycerin  In AF with RVR this AM- amiodarone protocol RESP_ IS for atelectasis. Left hemidiaphragm elevated, follow RENAL- creatinine and lytes OK, diurese GI- some gastric dil on CXR- Reglan x 24 hours  Clears, advance slowly if tolerated ENDO- CBG well controlled  Transition from drip to levemir + SSI Anemia secondary to ABL- mild, follow SCD + enoxaparin until Pradaxa restarted Dc chest tubes Mobilize   LOS: 1 day    Connor Duffy 03/31/2021

## 2021-03-31 NOTE — Plan of Care (Signed)

## 2021-03-31 NOTE — Progress Notes (Signed)
  Amiodarone Drug - Drug Interaction Consult Note  Recommendations: -No DDIs noted  Amiodarone is metabolized by the cytochrome P450 system and therefore has the potential to cause many drug interactions. Amiodarone has an average plasma half-life of 50 days (range 20 to 100 days).   There is potential for drug interactions to occur several weeks or months after stopping treatment and the onset of drug interactions may be slow after initiating amiodarone.   []  Statins: Increased risk of myopathy. Simvastatin- restrict dose to 20mg  daily. Other statins: counsel patients to report any muscle pain or weakness immediately.  []  Anticoagulants: Amiodarone can increase anticoagulant effect. Consider warfarin dose reduction. Patients should be monitored closely and the dose of anticoagulant altered accordingly, remembering that amiodarone levels take several weeks to stabilize.  []  Antiepileptics: Amiodarone can increase plasma concentration of phenytoin, the dose should be reduced. Note that small changes in phenytoin dose can result in large changes in levels. Monitor patient and counsel on signs of toxicity.  []  Beta blockers: increased risk of bradycardia, AV block and myocardial depression. Sotalol - avoid concomitant use.  []   Calcium channel blockers (diltiazem and verapamil): increased risk of bradycardia, AV block and myocardial depression.  []   Cyclosporine: Amiodarone increases levels of cyclosporine. Reduced dose of cyclosporine is recommended.  []  Digoxin dose should be halved when amiodarone is started.  []  Diuretics: increased risk of cardiotoxicity if hypokalemia occurs.  []  Oral hypoglycemic agents (glyburide, glipizide, glimepiride): increased risk of hypoglycemia. Patient's glucose levels should be monitored closely when initiating amiodarone therapy.   []  Drugs that prolong the QT interval:  Torsades de pointes risk may be increased with concurrent use - avoid if possible.   Monitor QTc, also keep magnesium/potassium WNL if concurrent therapy can't be avoided.  Antibiotics: e.g. fluoroquinolones, erythromycin.  Antiarrhythmics: e.g. quinidine, procainamide, disopyramide, sotalol.  Antipsychotics: e.g. phenothiazines, haloperidol.   Lithium, tricyclic antidepressants, and methadone. Thank Concha Pyo  03/31/2021 8:26 AM

## 2021-04-01 ENCOUNTER — Inpatient Hospital Stay (HOSPITAL_COMMUNITY): Payer: No Typology Code available for payment source

## 2021-04-01 LAB — POCT I-STAT 7, (LYTES, BLD GAS, ICA,H+H)
Acid-base deficit: 4 mmol/L — ABNORMAL HIGH (ref 0.0–2.0)
Bicarbonate: 21.9 mmol/L (ref 20.0–28.0)
Calcium, Ion: 1.15 mmol/L (ref 1.15–1.40)
HCT: 26 % — ABNORMAL LOW (ref 39.0–52.0)
Hemoglobin: 8.8 g/dL — ABNORMAL LOW (ref 13.0–17.0)
O2 Saturation: 99 %
Patient temperature: 36.6
Potassium: 5 mmol/L (ref 3.5–5.1)
Sodium: 136 mmol/L (ref 135–145)
TCO2: 23 mmol/L (ref 22–32)
pCO2 arterial: 39.9 mmHg (ref 32.0–48.0)
pH, Arterial: 7.346 — ABNORMAL LOW (ref 7.350–7.450)
pO2, Arterial: 134 mmHg — ABNORMAL HIGH (ref 83.0–108.0)

## 2021-04-01 LAB — CBC
HCT: 28.8 % — ABNORMAL LOW (ref 39.0–52.0)
Hemoglobin: 9.6 g/dL — ABNORMAL LOW (ref 13.0–17.0)
MCH: 32.1 pg (ref 26.0–34.0)
MCHC: 33.3 g/dL (ref 30.0–36.0)
MCV: 96.3 fL (ref 80.0–100.0)
Platelets: 134 10*3/uL — ABNORMAL LOW (ref 150–400)
RBC: 2.99 MIL/uL — ABNORMAL LOW (ref 4.22–5.81)
RDW: 14.1 % (ref 11.5–15.5)
WBC: 10.5 10*3/uL (ref 4.0–10.5)
nRBC: 0 % (ref 0.0–0.2)

## 2021-04-01 LAB — GLUCOSE, CAPILLARY
Glucose-Capillary: 118 mg/dL — ABNORMAL HIGH (ref 70–99)
Glucose-Capillary: 130 mg/dL — ABNORMAL HIGH (ref 70–99)
Glucose-Capillary: 113 mg/dL — ABNORMAL HIGH (ref 70–99)
Glucose-Capillary: 126 mg/dL — ABNORMAL HIGH (ref 70–99)
Glucose-Capillary: 147 mg/dL — ABNORMAL HIGH (ref 70–99)
Glucose-Capillary: 122 mg/dL — ABNORMAL HIGH (ref 70–99)

## 2021-04-01 LAB — BASIC METABOLIC PANEL
Anion gap: 7 (ref 5–15)
BUN: 20 mg/dL (ref 8–23)
CO2: 23 mmol/L (ref 22–32)
Calcium: 7.6 mg/dL — ABNORMAL LOW (ref 8.9–10.3)
Chloride: 99 mmol/L (ref 98–111)
Creatinine, Ser: 1.32 mg/dL — ABNORMAL HIGH (ref 0.61–1.24)
GFR, Estimated: 56 mL/min — ABNORMAL LOW (ref >60)
Glucose, Bld: 120 mg/dL — ABNORMAL HIGH (ref 70–99)
Potassium: 4.3 mmol/L (ref 3.5–5.1)
Sodium: 129 mmol/L — ABNORMAL LOW (ref 135–145)

## 2021-04-01 MED ORDER — INFLUENZA VAC A&B SA ADJ QUAD 0.5 ML IM PRSY
0.5000 mL | PREFILLED_SYRINGE | INTRAMUSCULAR | Status: DC
  Filled 2021-04-01: qty 0.5

## 2021-04-01 MED ORDER — METOCLOPRAMIDE HCL 5 MG/ML IJ SOLN
10.0000 mg | Freq: Four times a day (QID) | INTRAMUSCULAR | Status: DC
  Administered 2021-04-01 – 2021-04-02 (×3): 10 mg via INTRAVENOUS
  Filled 2021-04-01 (×4): qty 2

## 2021-04-01 MED ORDER — SODIUM CHLORIDE 0.9 % IV SOLN: INTRAVENOUS | Status: AC

## 2021-04-01 MED ORDER — AMIODARONE HCL 200 MG PO TABS
400.0000 mg | ORAL_TABLET | Freq: Two times a day (BID) | ORAL | Status: DC
  Administered 2021-04-01 – 2021-04-04 (×7): 400 mg via ORAL
  Filled 2021-04-01 (×7): qty 2

## 2021-04-01 MED ORDER — INSULIN ASPART 100 UNIT/ML IJ SOLN
0.0000 [IU] | Freq: Three times a day (TID) | INTRAMUSCULAR | Status: DC
  Administered 2021-04-01: 3 [IU] via SUBCUTANEOUS
  Administered 2021-04-02: 2 [IU] via SUBCUTANEOUS

## 2021-04-01 MED ORDER — ASPIRIN EC 81 MG PO TBEC
81.0000 mg | DELAYED_RELEASE_TABLET | Freq: Every day | ORAL | Status: DC
  Administered 2021-04-01 – 2021-04-04 (×4): 81 mg via ORAL
  Filled 2021-04-01 (×4): qty 1

## 2021-04-01 NOTE — Progress Notes (Addendum)
      [address]       [place] [address]             [phone]      2 Days Post-Op  Procedure(s) (LRB): CORONARY ARTERY BYPASS GRAFTING (CABG), ON PUMP, TIMES FOUR, USING LEFT INTERNAL MAMMARY ARTERY AND RIGHT ENDOSCOPICALLY HARVESTED GREATER SAPHENOUS VEIN (N/A) TRANSESOPHAGEAL ECHOCARDIOGRAM (TEE) (N/A) CLIPPING OF ATRIAL USING ATRICURE 40MM CLIP (Left)   Total Length of Stay:  LOS: 2 days    SUBJECTIVE:  Vitals:   04/01/21 1500 04/01/21 1511  BP: (!) 87/63   Pulse: 99   Resp: 15   Temp:  98.3 F (36.8 C)  SpO2: 100%     Intake/Output      09/27 0701 09/28 0700 09/28 0701 09/29 0700   P.O. 240 480   I.V. (mL/kg) 744 (8.2) 557.8 (6.1)   Blood     IV Piggyback 300    Total Intake(mL/kg) 1284 (14.1) 1037.8 (11.4)   Urine (mL/kg/hr) 1485 (0.7) 185 (0.2)   Blood     Chest Tube 1260 50   Total Output 2745 235   Net -1461.1 +802.8            sodium chloride Stopped (03/30/21 1621)   sodium chloride     sodium chloride 10 mL/hr at 03/30/21 1419   lactated ringers Stopped (03/31/21 1601)   lactated ringers 20 mL/hr at 03/30/21 1700    CBC    Component Value Date/Time   WBC 10.5 04/01/2021 0325   RBC 2.99 (L) 04/01/2021 0325   HGB 9.6 (L) 04/01/2021 0325   HCT 28.8 (L) 04/01/2021 0325   PLT 134 (L) 04/01/2021 0325   MCV 96.3 04/01/2021 0325   MCH 32.1 04/01/2021 0325   MCHC 33.3 04/01/2021 0325   RDW 14.1 04/01/2021 0325   LYMPHSABS 1.1 03/05/2020 1612   MONOABS 0.6 03/05/2020 1612   EOSABS 0.1 03/05/2020 1612   BASOSABS 0.0 03/05/2020 1612   CMP     Component Value Date/Time   NA 129 (L) 04/01/2021 0325   K 4.3 04/01/2021 0325   CL 99 04/01/2021 0325   CO2 23 04/01/2021 0325   GLUCOSE 120 (H) 04/01/2021 0325   BUN 20 04/01/2021 0325   CREATININE 1.32 (H) 04/01/2021 0325   CALCIUM 7.6 (L) 04/01/2021 0325   PROT 5.1 (L) 03/31/2021 0600   ALBUMIN 3.3 (L) 03/31/2021 0600   AST 42 (H) 03/31/2021 0600   ALT 14 03/31/2021  0600   ALKPHOS 42 03/31/2021 0600   BILITOT 1.0 03/31/2021 0600   GFRNONAA 56 (L) 04/01/2021 0325   GFRAA >60 03/05/2020 1612   ABG    Component Value Date/Time   PHART 7.308 (L) 03/30/2021 1843   PCO2ART 47.5 03/30/2021 1843   PO2ART 80 (L) 03/30/2021 1843   HCO3 23.8 03/30/2021 1843   TCO2 25 03/30/2021 1843   ACIDBASEDEF 3.0 (H) 03/30/2021 1843   O2SAT 94.0 03/30/2021 1843   CBG (last 3)  Recent Labs    04/01/21 0651 04/01/21 1105 04/01/21 1507  GLUCAP 126* 113* 147*     ASSESSMENT:  Patient in and out of A. Fib, rates in the 120s at times... BP is too low to push IV Lopressor... May benefit from additional Amiodarone bolus if persists.  Patient had prior to surgery and will need Pradaxa restarted once pacing wires are out     Ellwood Handler, PA-C 04/01/21

## 2021-04-01 NOTE — Discharge Summary (Signed)
[address]       [place] [address]             [phone]    Physician Discharge Summary  Patient ID: Connor Duffy MRN: [id number] DOB/AGE: 78-14-1942 78 y.o.  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[facility])    Dyspnea 03/14/2015   Atypical chest pain 03/14/2015   PAF (paroxysmal atrial fibrillation) ([facility]) 03/14/2015   Arthritis    Cancer ([facility])    Spinal stenosis    Atrial fibrillation (H[facility])    Degenerative arthritis of hip 07/08/2011     Discharge Diagnoses:  Patient Active Problem List   Diagnosis Date Noted   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   Genetic testing 05/30/2019   Family history of breast cancer    Family history of prostate cancer    Family history of colon cancer    Family history of breast cancer in male    Chronic anticoagulation    History of deep vein thrombosis    Dysphagia    Acute lower GI bleeding 05/29/2018   Overweight (BMI 25.0-29.9) 05/25/2017   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  He had an ablation for A. fib in May 2020.  Recently he has been experiencing chest tightness and palpitations with exertion.  He works at [employer] about 5 days a week.  He mainly does elliptical or bike work because of knee issues.  Recently about 5 minutes into his workout he will experience tightness in his chest and shortness of breath.  He also feels like his heart is going to beat out of his chest.  He thought he was having episodes of atrial fibrillation.  He had a nuclear stress test which showed reversible ischemia of the large size in the mid to distal anterior, anteroseptal, and apical walls.  Ejection fraction was 60%.  He then underwent cardiac catheterization where he was found to have chronic total occlusion of his LAD, 75% stenosis in the mid circumflex, and diffuse disease in the right coronary.   It was felt the patient would best be treated with Coronary bypass grafting.  He was referred to [facility] for evaluation.  He was evaluated by Dr. Roxan Hockey who felt patient would benefit from coronary bypass grafting procedure with clipping of his left atrial appendage.  The risks and benefits of the procedure were explained to the patient and he  was agreeable to proceed.  Hospital Course:  Connor Duffy presented to [facility] on 03/30/2021.  He was taken to the operating room and underwent CABG x 4 utilizing LIMA to LAD, SVG to Diagonal, SVG to OM, and SVG to PDA.  He also underwent clipping of LA appendage with a size 40 mm Atricure Flex Clip.  Finally, he had endoscopic harvest of greater saphenous vein from his right leg.  He tolerated the procedure without difficulty and was taken to the SICU in stable condition.  He remained hemodynamically stable.  He was weaned from the ventilator per protocol and extubated by 6 PM on the day of surgery.  He developed atrial fibrillation with RVR on the morning of the first  postoperative day.  Amiodarone infusion was initiated.  He converted back to sinus rhythm by midday on postop day 1.  He had low blood pressure and was treated with a 500 ml NS bolus.  He developed mild gastric dilation which was treated with reglan and slow diet advancement.  He developed mild AKI with creatinine level peaking at 1.46.  The patient's chest tubes were removed on 04/01/2021.  The patient converted back into rate controlled Atrial Fibrillation.  This is chronic for the patient.  His pacing wires were removed without difficulty.  He was restarted on his home regimen of Pradaxa for stroke prevention.  He remained hypotensive, thus addition ACE/ARB could not be instituted.  He was felt stable for transfer to the progressive care unit on 04/02/2021. He had a lot of diarrhea on 09/29 after getting a laxative;this did stop. His a fib rate was not well controlled on 09/30. Lopressor was increased to 25 mg bid and he was restarted on Pradaxa on 09/30  Patient has no history of diabetes and pre op HGA1C is 6. Accu checks and sliding scale have been stopped. He has been ambulating on room air with good oxygenation. He is tolerating a diet. His wounds are clean, dry, and healing without signs of infection. Thrombocytopenia did resolve as last platelet count was up to 206,000. CXR on 09/30 did show elevation of the left hemi diaphragm.  The patient continued to have some issues with his atrial fibrillation and rate requiring ongoing medication adjustments.  At time of discharge the patient has been resumed on his home Cardizem and amiodarone has been stopped.  It is noted he failed Tikosyn previously.  Clinically, he continued to do quite well and his recovery.  Oxygen was weaned and he maintains good saturations on room air.  Incisions were noted to be healing well without evidence of infection.  He was tolerating diet.  He was tolerating ambulation and cardiac rehab modalities.  At the time of discharge patient  was felt to be quite stable. Lasix has been discontinued with elevation of creatinine.  Consults: None  Treatments: surgery:   DATE OF PROCEDURE: 03/30/2021   PREOPERATIVE DIAGNOSES:  Three-vessel coronary disease and paroxysmal atrial fibrillation.   POSTOPERATIVE DIAGNOSES:  Three-vessel coronary disease and paroxysmal atrial fibrillation.   PROCEDURE PERFORMED:  Median sternotomy, extracorporeal circulation, coronary artery bypass grafting x4 (left internal mammary artery to LAD, saphenous vein graft to first diagonal, saphenous vein graft to the obtuse marginal 1, saphenous vein graft to  posterior descending), endoscopic vein harvest right leg.  Left atrial appendage clip with AtriCure Flex-V 40 mm clip.   Discharge Exam: Blood pressure 91/70, pulse 83, temperature 97.6 F (36.4 C), temperature source Oral, resp. rate 18, height 5'  11" (1.803 m), weight 88.2 kg, SpO2 95 %.   General appearance: alert, cooperative, and no distress Heart: irregularly irregular rhythm and tachy Lungs: dim left base Abdomen: benign Extremities: + LE edema, some venous stasis changes Wound: incis healing well   Discharge Medications:  The patient has been discharged on:   1.Beta Blocker:  Yes [ X  ]                              No   [   ]                              If No, reason:  2.Ace Inhibitor/ARB: Yes [   ]                                     No  [ x   ]                                     If No, reason: hypotension  3.Statin:   Yes [ X  ]                  No  [   ]                  If No, reason:  4.Ecasa:  Yes  [ X  ]                  No   [   ]                  If No, reason:  Patient had ACS upon admission:  Plavix/P2Y12 inhibitor: Yes [   ]                                      No  [  x ]     Discharge Instructions     Amb Referral to Cardiac Rehabilitation   Complete by: As directed    Diagnosis: CABG   CABG X ___: 4   After initial evaluation and assessments  completed: Virtual Based Care may be provided alone or in conjunction with Phase 2 Cardiac Rehab based on patient barriers.: Yes   Discharge patient   Complete by: As directed    Discharge disposition: 01-Home or Self Care   Discharge patient date: 04/04/2021      Allergies as of 04/04/2021       Reactions   Protective Adhesive Powder Dermatitis   SILK TAPE caused blisters, bleeding, and open wounds.   Wound Dressing Adhesive Dermatitis   SILK TAPE caused blisters, bleeding, and open wounds.        Medication List     STOP taking these medications    diltiazem 120 MG 24 hr capsule Commonly known as: TIAZAC       TAKE these medications    aspirin 81 MG EC tablet Take 1 tablet (81 mg total) by mouth daily. Swallow whole.   atorvastatin 40 MG tablet Commonly known as: LIPITOR Take 40 mg by mouth daily.   carboxymethylcellulose 0.5 % Soln Commonly known  as: REFRESH PLUS Place 1 drop into both eyes 2 (two) times daily as needed (dry eyes).   D3-1000 25 MCG (1000 UT) capsule Generic drug: Cholecalciferol Take 6,000 Units by mouth once a week. Sundays   dabigatran 150 MG Caps capsule Commonly known as: PRADAXA Take 150 mg 2 (two) times daily by mouth.   darolutamide 300 MG tablet Commonly known as: NUBEQA Take 600 mg by mouth 2 (two) times daily.   diltiazem 120 MG 24 hr capsule Commonly known as: CARDIZEM CD Take 1 capsule (120 mg total) by mouth daily.   gabapentin 300 MG capsule Commonly known as: NEURONTIN Take 300 mg at bedtime by mouth.   traMADol 50 MG tablet Commonly known as: ULTRAM Take 1 tablet (50 mg total) by mouth every 6 (six) hours as needed for up to 7 days for moderate pain.   Zoledronic Acid 4 MG Solr Inject 3.5 mg into the vein every 3 (three) months.   zolpidem 5 MG tablet Commonly known as: AMBIEN Take 5 mg by mouth at bedtime.        Follow-up Information     Melrose Nakayama, MD Follow up on 04/28/2021.   Specialty:  Cardiothoracic Surgery Why: Appointment is at 11:45, please get CXR at 11:15 at [facility] located on first floor of our office building Contact information: [address] [place] [address] [phone]         [facility] Follow up.   Why: contact at time of discharge and arrange followup with Cardiologist Contact information: [place] [address] [phone]         [facility] Follow up.   Why: rolling walker Contact information: [place] [address] [phone]                 Signed: John Giovanni, PA-C  04/05/2021, 9:25 AM

## 2021-04-01 NOTE — Progress Notes (Signed)
2 Days Post-Op Procedure(s) (LRB): CORONARY ARTERY BYPASS GRAFTING (CABG), ON PUMP, TIMES FOUR, USING LEFT INTERNAL MAMMARY ARTERY AND RIGHT ENDOSCOPICALLY HARVESTED GREATER SAPHENOUS VEIN (N/A) TRANSESOPHAGEAL ECHOCARDIOGRAM (TEE) (N/A) CLIPPING OF ATRIAL USING ATRICURE 40MM CLIP (Left) Subjective: No complaints this AM Denies nausea  Objective: Vital signs in last 24 hours: Temp:  [98 F (36.7 C)-99.5 F (37.5 C)] 98.3 F (36.8 C) (09/28 0600) Pulse Rate:  [64-249] 65 (09/28 0600) Resp:  [11-29] 12 (09/28 0600) BP: (81-132)/(56-104) 91/69 (09/28 0600) SpO2:  [73 %-100 %] 100 % (09/28 0600) Arterial Line BP: (105-135)/(54-68) 125/64 (09/27 1500) Weight:  [91.1 kg] 91.1 kg (09/28 0500)  Hemodynamic parameters for last 24 hours: PAP: (25-29)/(15-23) 26/15  Intake/Output from previous day: 09/27 0701 - 09/28 0700 In: 1284 [P.O.:240; I.V.:744; IV Piggyback:300] Out: 2745 [Urine:1485; Chest Tube:1260] Intake/Output this shift: No intake/output data recorded.  General appearance: alert, cooperative, and no distress Neurologic: intact Heart: regular rate and rhythm Lungs: diminished breath sounds bibasilar Abdomen: not distended but hypoactive BS  Lab Results: Recent Labs    03/31/21 1627 04/01/21 0325  WBC 11.6* 10.5  HGB 10.2* 9.6*  HCT 29.8* 28.8*  PLT 142* 134*   BMET:  Recent Labs    03/31/21 1627 04/01/21 0325  NA 129* 129*  K 4.4 4.3  CL 99 99  CO2 22 23  GLUCOSE 153* 120*  BUN 16 20  CREATININE 1.16 1.32*  CALCIUM 7.6* 7.6*    PT/INR:  Recent Labs    03/30/21 1406  LABPROT 16.7*  INR 1.4*   ABG    Component Value Date/Time   PHART 7.308 (L) 03/30/2021 1843   HCO3 23.8 03/30/2021 1843   TCO2 25 03/30/2021 1843   ACIDBASEDEF 3.0 (H) 03/30/2021 1843   O2SAT 94.0 03/30/2021 1843   CBG (last 3)  Recent Labs    03/31/21 1149 03/31/21 1630 03/31/21 1948  GLUCAP 140* 148* 140*    Assessment/Plan: S/P Procedure(s) (LRB): CORONARY ARTERY  BYPASS GRAFTING (CABG), ON PUMP, TIMES FOUR, USING LEFT INTERNAL MAMMARY ARTERY AND RIGHT ENDOSCOPICALLY HARVESTED GREATER SAPHENOUS VEIN (N/A) TRANSESOPHAGEAL ECHOCARDIOGRAM (TEE) (N/A) CLIPPING OF ATRIAL USING ATRICURE 40MM CLIP (Left) POD # 2 CV- in SR on amiodarone drip  Will resume Pradaxa once pacing wires out  BP a little soft this AM- will give NS 500 ml RESP- still some left lower lobe atelectasis- improved from yesterday RENAL- creatinine up slightly to 1.3  Mild hyponatremia ENDO- CBG mildly elevated  Change to [facility] HS SSI GI- gastric dilatation improved but still hypoactive BS  Continue Reglan another 24 hours, advance diet Anemia secondary to ABL- stable SCD + enoxaparin for DVT prophylaxis Cardiac rehab Dc chest tubes   LOS: 2 days    Connor Duffy 04/01/2021

## 2021-04-02 LAB — BASIC METABOLIC PANEL
Anion gap: 9 (ref 5–15)
BUN: 25 mg/dL — ABNORMAL HIGH (ref 8–23)
CO2: 22 mmol/L (ref 22–32)
Calcium: 7.8 mg/dL — ABNORMAL LOW (ref 8.9–10.3)
Chloride: 100 mmol/L (ref 98–111)
Creatinine, Ser: 1.3 mg/dL — ABNORMAL HIGH (ref 0.61–1.24)
GFR, Estimated: 57 mL/min — ABNORMAL LOW (ref >60)
Glucose, Bld: 155 mg/dL — ABNORMAL HIGH (ref 70–99)
Potassium: 3.7 mmol/L (ref 3.5–5.1)
Sodium: 131 mmol/L — ABNORMAL LOW (ref 135–145)

## 2021-04-02 LAB — GLUCOSE, CAPILLARY
Glucose-Capillary: 138 mg/dL — ABNORMAL HIGH (ref 70–99)
Glucose-Capillary: 102 mg/dL — ABNORMAL HIGH (ref 70–99)
Glucose-Capillary: 117 mg/dL — ABNORMAL HIGH (ref 70–99)
Glucose-Capillary: 118 mg/dL — ABNORMAL HIGH (ref 70–99)

## 2021-04-02 LAB — CBC
HCT: 28.8 % — ABNORMAL LOW (ref 39.0–52.0)
Hemoglobin: 9.7 g/dL — ABNORMAL LOW (ref 13.0–17.0)
MCH: 31.6 pg (ref 26.0–34.0)
MCHC: 33.7 g/dL (ref 30.0–36.0)
MCV: 93.8 fL (ref 80.0–100.0)
Platelets: 141 10*3/uL — ABNORMAL LOW (ref 150–400)
RBC: 3.07 MIL/uL — ABNORMAL LOW (ref 4.22–5.81)
RDW: 14 % (ref 11.5–15.5)
WBC: 7.6 10*3/uL (ref 4.0–10.5)
nRBC: 0 % (ref 0.0–0.2)

## 2021-04-02 MED ORDER — ~~LOC~~ CARDIAC SURGERY, PATIENT & FAMILY EDUCATION: Freq: Once | Status: AC

## 2021-04-02 MED ORDER — MAGNESIUM HYDROXIDE 400 MG/5ML PO SUSP
30.0000 mL | Freq: Every day | ORAL | Status: DC | PRN
Start: 2021-04-02 — End: 2021-04-03

## 2021-04-02 MED ORDER — SODIUM CHLORIDE 0.9% FLUSH: 3.0000 mL | INTRAVENOUS | Status: DC | PRN

## 2021-04-02 MED ORDER — SODIUM CHLORIDE 0.9 % IV SOLN: 250.0000 mL | INTRAVENOUS | Status: DC | PRN

## 2021-04-02 MED ORDER — DAROLUTAMIDE 300 MG PO TABS
600.0000 mg | ORAL_TABLET | Freq: Two times a day (BID) | ORAL | Status: DC
  Administered 2021-04-03 – 2021-04-04 (×2): 600 mg via ORAL
  Filled 2021-04-02 (×3): qty 2

## 2021-04-02 MED ORDER — SODIUM CHLORIDE 0.9% FLUSH
3.0000 mL | Freq: Two times a day (BID) | INTRAVENOUS | Status: DC
  Administered 2021-04-02 – 2021-04-04 (×4): 3 mL via INTRAVENOUS

## 2021-04-02 MED ORDER — LOPERAMIDE HCL 1 MG/7.5ML PO SUSP
2.0000 mg | Freq: Once | ORAL | Status: AC
  Administered 2021-04-02: 2 mg via ORAL
  Filled 2021-04-02: qty 15

## 2021-04-02 MED FILL — Lidocaine HCl Local Preservative Free (PF) Inj 1%: INTRAMUSCULAR | Qty: 10 | Status: AC

## 2021-04-02 MED FILL — Electrolyte-R (PH 7.4) Solution: INTRAVENOUS | Qty: 4000 | Status: AC

## 2021-04-02 MED FILL — Mannitol IV Soln 20%: INTRAVENOUS | Qty: 500 | Status: AC

## 2021-04-02 MED FILL — Sodium Bicarbonate IV Soln 8.4%: INTRAVENOUS | Qty: 50 | Status: AC

## 2021-04-02 MED FILL — Heparin Sodium (Porcine) Inj 1000 Unit/ML: INTRAMUSCULAR | Qty: 10 | Status: AC

## 2021-04-02 MED FILL — Sodium Chloride IV Soln 0.9%: INTRAVENOUS | Qty: 2000 | Status: AC

## 2021-04-02 MED FILL — Potassium Chloride Inj 2 mEq/ML: INTRAVENOUS | Qty: 20 | Status: AC

## 2021-04-02 NOTE — Progress Notes (Signed)
3 Days Post-Op Procedure(s) (LRB): CORONARY ARTERY BYPASS GRAFTING (CABG), ON PUMP, TIMES FOUR, USING LEFT INTERNAL MAMMARY ARTERY AND RIGHT ENDOSCOPICALLY HARVESTED GREATER SAPHENOUS VEIN (N/A) TRANSESOPHAGEAL ECHOCARDIOGRAM (TEE) (N/A) CLIPPING OF ATRIAL USING ATRICURE 40MM CLIP (Left) Subjective: No complaints this AM Denies pain, + flatus, no BM  Objective: Vital signs in last 24 hours: Temp:  [97.6 F (36.4 C)-98.3 F (36.8 C)] 98 F (36.7 C) (09/29 0700) Pulse Rate:  [70-114] 93 (09/29 0700) Cardiac Rhythm: Atrial fibrillation (09/29 0000) Resp:  [13-27] 17 (09/29 0700) BP: (84-150)/(56-86) 94/58 (09/29 0700) SpO2:  [96 %-100 %] 99 % (09/29 0700) Weight:  [90.2 kg] 90.2 kg (09/29 0500)  Hemodynamic parameters for last 24 hours:    Intake/Output from previous day: 09/28 0701 - 09/29 0700 In: 1277.8 [P.O.:720; I.V.:557.8] Out: 1260 [Urine:1210; Chest Tube:50] Intake/Output this shift: No intake/output data recorded.  General appearance: alert, cooperative, and no distress Neurologic: intact Heart: irregularly irregular rhythm Lungs: diminished breath sounds bibasilar Abdomen: normal findings: soft, non-tender  Lab Results: Recent Labs    03/31/21 1627 04/01/21 0325  WBC 11.6* 10.5  HGB 10.2* 9.6*  HCT 29.8* 28.8*  PLT 142* 134*   BMET:  Recent Labs    03/31/21 1627 04/01/21 0325  NA 129* 129*  K 4.4 4.3  CL 99 99  CO2 22 23  GLUCOSE 153* 120*  BUN 16 20  CREATININE 1.16 1.32*  CALCIUM 7.6* 7.6*    PT/INR:  Recent Labs    03/30/21 1406  LABPROT 16.7*  INR 1.4*   ABG    Component Value Date/Time   PHART 7.308 (L) 03/30/2021 1843   HCO3 23.8 03/30/2021 1843   TCO2 25 03/30/2021 1843   ACIDBASEDEF 3.0 (H) 03/30/2021 1843   O2SAT 94.0 03/30/2021 1843   CBG (last 3)  Recent Labs    04/01/21 1507 04/01/21 2227 04/02/21 0704  GLUCAP 147* 122* 138*    Assessment/Plan: S/P Procedure(s) (LRB): CORONARY ARTERY BYPASS GRAFTING (CABG), ON  PUMP, TIMES FOUR, USING LEFT INTERNAL MAMMARY ARTERY AND RIGHT ENDOSCOPICALLY HARVESTED GREATER SAPHENOUS VEIN (N/A) TRANSESOPHAGEAL ECHOCARDIOGRAM (TEE) (N/A) CLIPPING OF ATRIAL USING ATRICURE 40MM CLIP (Left) Plan for transfer to step-down: see transfer orders POD # 3 CV- in rate controlled atrial fib- on amiodarone and Lopressor  BP still relatively low  Will dc pacing wires  Start Pradaxa tomorrow RESP- continue IS for atelectasis RENAL- labs pending, good UO ENDO- CBG mildly elevated, continue SSI SCD + enoxaparin for DVT prophylaxis Dc enoxaparin when Pradaxa resumed GI- tolerating diet Cardiac rehab   LOS: 3 days    Melrose Nakayama 04/02/2021

## 2021-04-02 NOTE — Plan of Care (Signed)

## 2021-04-02 NOTE — Progress Notes (Signed)
      [name]       [place] [address]             [phone]      3 Days Post-Op  Procedure(s) (LRB): CORONARY ARTERY BYPASS GRAFTING (CABG), ON PUMP, TIMES FOUR, USING LEFT INTERNAL MAMMARY ARTERY AND RIGHT ENDOSCOPICALLY HARVESTED GREATER SAPHENOUS VEIN (N/A) TRANSESOPHAGEAL ECHOCARDIOGRAM (TEE) (N/A) CLIPPING OF ATRIAL USING ATRICURE 40MM CLIP (Left)   Total Length of Stay:  LOS: 3 days    SUBJECTIVE:  Vitals:   04/02/21 1215 04/02/21 1230  BP: 95/63 90/63  Pulse: 85 89  Resp: (!) 22 (!) 25  Temp:    SpO2: 93% 97%    Intake/Output      09/28 0701 09/29 0700 09/29 0701 09/30 0700   P.O. 720    I.V. (mL/kg) 557.8 (6.2)    IV Piggyback     Total Intake(mL/kg) 1277.8 (14.2)    Urine (mL/kg/hr) 1210 (0.6) 500 (0.7)   Stool  4   Chest Tube 50    Total Output 1260 504   Net +17.8 -504            sodium chloride Stopped (03/30/21 1621)   sodium chloride     sodium chloride 10 mL/hr at 03/30/21 1419   lactated ringers Stopped (03/31/21 1601)   lactated ringers 20 mL/hr at 03/30/21 1700    CBC    Component Value Date/Time   WBC 7.6 04/02/2021 0730   RBC 3.07 (L) 04/02/2021 0730   HGB 9.7 (L) 04/02/2021 0730   HCT 28.8 (L) 04/02/2021 0730   PLT 141 (L) 04/02/2021 0730   MCV 93.8 04/02/2021 0730   MCH 31.6 04/02/2021 0730   MCHC 33.7 04/02/2021 0730   RDW 14.0 04/02/2021 0730   LYMPHSABS 1.1 03/05/2020 1612   MONOABS 0.6 03/05/2020 1612   EOSABS 0.1 03/05/2020 1612   BASOSABS 0.0 03/05/2020 1612   CMP     Component Value Date/Time   NA 131 (L) 04/02/2021 0730   K 3.7 04/02/2021 0730   CL 100 04/02/2021 0730   CO2 22 04/02/2021 0730   GLUCOSE 155 (H) 04/02/2021 0730   BUN 25 (H) 04/02/2021 0730   CREATININE 1.30 (H) 04/02/2021 0730   CALCIUM 7.8 (L) 04/02/2021 0730   PROT 5.1 (L) 03/31/2021 0600   ALBUMIN 3.3 (L) 03/31/2021 0600   AST 42 (H) 03/31/2021 0600   ALT 14 03/31/2021 0600   ALKPHOS 42 03/31/2021 0600    BILITOT 1.0 03/31/2021 0600   GFRNONAA 57 (L) 04/02/2021 0730   GFRAA >60 03/05/2020 1612   ABG    Component Value Date/Time   PHART 7.308 (L) 03/30/2021 1843   PCO2ART 47.5 03/30/2021 1843   PO2ART 80 (L) 03/30/2021 1843   HCO3 23.8 03/30/2021 1843   TCO2 25 03/30/2021 1843   ACIDBASEDEF 3.0 (H) 03/30/2021 1843   O2SAT 94.0 03/30/2021 1843   CBG (last 3)  Recent Labs    04/01/21 2227 04/02/21 0704 04/02/21 1134  GLUCAP 122* 138* 102*    ASSESSMENT: Patient sleeping and not awakened EPW removed earlier this am History of and remains in a fib with CVR. On Amiodarone 400 mg bid and Lopressor 12.5 mg bid.   Nani Skillern, PA-C 04/02/2021 14:58

## 2021-04-02 NOTE — Progress Notes (Signed)
Temporary epicardial wires discontinued per order. Vital signs taken per protocol. Minimal drainage from atrial wire site post removal. Will continue to monitor.

## 2021-04-02 NOTE — Progress Notes (Signed)
Checked with pt x2, having diarrhea and preferred not to walk right now. Sts he will walk with RN later. Encouraged IS, will f/u tomorrow. [place], ACSM 2:13 PM 04/02/2021

## 2021-04-03 ENCOUNTER — Inpatient Hospital Stay (HOSPITAL_COMMUNITY): Payer: No Typology Code available for payment source

## 2021-04-03 LAB — CBC
HCT: 29.1 % — ABNORMAL LOW (ref 39.0–52.0)
Hemoglobin: 9.9 g/dL — ABNORMAL LOW (ref 13.0–17.0)
MCH: 32 pg (ref 26.0–34.0)
MCHC: 34 g/dL (ref 30.0–36.0)
MCV: 94.2 fL (ref 80.0–100.0)
Platelets: 206 10*3/uL (ref 150–400)
RBC: 3.09 MIL/uL — ABNORMAL LOW (ref 4.22–5.81)
RDW: 14 % (ref 11.5–15.5)
WBC: 8.8 10*3/uL (ref 4.0–10.5)
nRBC: 0 % (ref 0.0–0.2)

## 2021-04-03 LAB — BASIC METABOLIC PANEL
Anion gap: 9 (ref 5–15)
BUN: 26 mg/dL — ABNORMAL HIGH (ref 8–23)
CO2: 21 mmol/L — ABNORMAL LOW (ref 22–32)
Calcium: 8 mg/dL — ABNORMAL LOW (ref 8.9–10.3)
Chloride: 101 mmol/L (ref 98–111)
Creatinine, Ser: 1.36 mg/dL — ABNORMAL HIGH (ref 0.61–1.24)
GFR, Estimated: 54 mL/min — ABNORMAL LOW (ref >60)
Glucose, Bld: 156 mg/dL — ABNORMAL HIGH (ref 70–99)
Potassium: 3.7 mmol/L (ref 3.5–5.1)
Sodium: 131 mmol/L — ABNORMAL LOW (ref 135–145)

## 2021-04-03 LAB — GLUCOSE, CAPILLARY: Glucose-Capillary: 113 mg/dL — ABNORMAL HIGH (ref 70–99)

## 2021-04-03 MED ORDER — METOPROLOL TARTRATE 25 MG PO TABS
25.0000 mg | ORAL_TABLET | Freq: Two times a day (BID) | ORAL | Status: DC
  Administered 2021-04-03 (×2): 25 mg via ORAL
  Filled 2021-04-03 (×3): qty 1

## 2021-04-03 MED ORDER — ALBUTEROL SULFATE (2.5 MG/3ML) 0.083% IN NEBU: 2.5000 mg | INHALATION_SOLUTION | Freq: Four times a day (QID) | RESPIRATORY_TRACT | Status: DC | PRN

## 2021-04-03 MED ORDER — DABIGATRAN ETEXILATE MESYLATE 150 MG PO CAPS
150.0000 mg | ORAL_CAPSULE | Freq: Two times a day (BID) | ORAL | Status: DC
  Administered 2021-04-03 – 2021-04-04 (×3): 150 mg via ORAL
  Filled 2021-04-03 (×4): qty 1

## 2021-04-03 MED ORDER — FUROSEMIDE 40 MG PO TABS
40.0000 mg | ORAL_TABLET | Freq: Every day | ORAL | Status: DC
  Administered 2021-04-03: 40 mg via ORAL
  Filled 2021-04-03: qty 1

## 2021-04-03 MED ORDER — POTASSIUM CHLORIDE CRYS ER 20 MEQ PO TBCR
30.0000 meq | EXTENDED_RELEASE_TABLET | Freq: Two times a day (BID) | ORAL | Status: AC
  Administered 2021-04-03 (×2): 30 meq via ORAL
  Filled 2021-04-03 (×2): qty 1

## 2021-04-03 MED ORDER — METOPROLOL TARTRATE 25 MG/10 ML ORAL SUSPENSION
25.0000 mg | Freq: Two times a day (BID) | ORAL | Status: DC
  Filled 2021-04-03: qty 10

## 2021-04-03 MED ORDER — INFLUENZA VAC A&B SA ADJ QUAD 0.5 ML IM PRSY
0.5000 mL | PREFILLED_SYRINGE | INTRAMUSCULAR | Status: AC
  Administered 2021-04-04: 0.5 mL via INTRAMUSCULAR
  Filled 2021-04-03: qty 0.5

## 2021-04-03 MED ORDER — POTASSIUM CHLORIDE CRYS ER 20 MEQ PO TBCR: 20.0000 meq | EXTENDED_RELEASE_TABLET | Freq: Every day | ORAL | Status: DC

## 2021-04-03 MED ORDER — ASPIRIN 81 MG PO TBEC: 81.0000 mg | DELAYED_RELEASE_TABLET | Freq: Every day | ORAL | 11 refills | Status: AC

## 2021-04-03 NOTE — Progress Notes (Addendum)
      [address]       [place] [address]             [phone]        4 Days Post-Op Procedure(s) (LRB): CORONARY ARTERY BYPASS GRAFTING (CABG), ON PUMP, TIMES FOUR, USING LEFT INTERNAL MAMMARY ARTERY AND RIGHT ENDOSCOPICALLY HARVESTED GREATER SAPHENOUS VEIN (N/A) TRANSESOPHAGEAL ECHOCARDIOGRAM (TEE) (N/A) CLIPPING OF ATRIAL USING ATRICURE 40MM CLIP (Left)  Subjective: Patient with a lot of diarrhea yesterday. Has stopped. He is asking when he can go home.  Objective: Vital signs in last 24 hours: Temp:  [97.6 F (36.4 C)-98.8 F (37.1 C)] 98.8 F (37.1 C) (09/30 0404) Pulse Rate:  [73-100] 91 (09/30 0404) Cardiac Rhythm: Heart block (09/29 1938) Resp:  [15-27] 18 (09/30 0404) BP: (80-128)/(53-78) 128/74 (09/30 0404) SpO2:  [93 %-99 %] 98 % (09/30 0404) Weight:  [88.4 kg-92.8 kg] 88.4 kg (09/30 0549)  Pre op weight 86.2 kg Current Weight  04/03/21 88.4 kg      Intake/Output from previous day: 09/29 0701 - 09/30 0700 In: -  Out: 505 [Urine:500; Stool:5]   Physical Exam:  Cardiovascular: Armandina Stammer Pulmonary: Clear to auscultation on the right and slightly diminished left base Abdomen: Soft, non tender, bowel sounds present. Extremities: Mild bilateral lower extremity edema. Wounds: Clean and dry.  No erythema or signs of infection.  Lab Results: CBC: Recent Labs    04/02/21 0730 04/03/21 0122  WBC 7.6 8.8  HGB 9.7* 9.9*  HCT 28.8* 29.1*  PLT 141* 206   BMET:  Recent Labs    04/02/21 0730 04/03/21 0122  NA 131* 131*  K 3.7 3.7  CL 100 101  CO2 22 21*  GLUCOSE 155* 156*  BUN 25* 26*  CREATININE 1.30* 1.36*  CALCIUM 7.8* 8.0*    PT/INR:  Lab Results  Component Value Date   INR 1.4 (H) 03/30/2021   INR 1.2 03/27/2021   INR 1.0 04/04/2015   ABG:  INR: Will add last result for INR, ABG once components are confirmed Will add last 4 CBG results once components are confirmed  Assessment/Plan:  1. CV - History of a  fib. Had LA clip at time of surgery. A fib rate earlier this am increased into the 120's. On Lopressor 12.5 mg bid and Amiodarone 400 mg bid. He was on Cardizem prior to surgery. Will increase Lopressor and discuss with Dr. Doree Barthel. Will also discuss with surgeon if ok to restart Pradaxa 2.  Pulmonary - On room air. CXR this am appears to show left pleural effusion, bowel gas. Encourage incentive spirometer. 3. Volume Overload - Lasix 40 mg daily 4.  Expected post op acute blood loss anemia - H and H this am stable at 9.9 and 29.1 5. CBGs 117/118/113. Pre op HGA1C 6. No history of DM. Stop accu checks and SS PRN 6. Supplement potassium 7. Creatinine slightly increased from 1.3 to 1.36. Creatinine upon admission 1.1.  8. Thrombocytopenia resolved as platelets up to 206,000 9. Will consider discharge once a fib rate better controlled  Donielle M ZimmermanPA-C 04/03/2021,7:37 AM   Patient seen and examined, agree with above CXR shows elevated left hemidiaphragm Will resume pradaxa Hopefully home over the weekend  [place]. Roxan Hockey, MD [facility] [phone]

## 2021-04-03 NOTE — Progress Notes (Signed)
   04/03/21 1405  Clinical Encounter Type  Visited With Patient and family together  Visit Type Initial  Referral From Nurse  Consult/Referral To Chaplain   Chaplain responded to consult for Advance Directive. Pt's wife was at bedside. Chaplain gave Pt paperwork and provided AD education. Pt and Pt's wife's questions were answered. Chaplain remains available.  This note was prepared by Chaplain Resident, Dante Gang, MDiv. Chaplain remains available as needed through the on-call pager: [phone].

## 2021-04-03 NOTE — Progress Notes (Signed)
CARDIAC REHAB PHASE I   POST:  Rate/Rhythm: 134 ST  BP:  Sitting: 137/92    Pt was amb by mobility team when I came to room. Educated pt on sternal precautions, wound care, heart healthy diet, and exercise. Encouraged continued IS use. Pt was receptive to education. Discussed CRPHII w/ pt. Pt is excited about CRPHII. Will refer to [place]. Will continue to follow.   [phone] Sheppard Plumber, [place], ACSM-CEP 04/03/2021 12:07 PM

## 2021-04-03 NOTE — Progress Notes (Signed)
ANTICOAGULATION CONSULT NOTE - Initial Consult  Pharmacy Consult for Pradaxa (Dabigatran)  Indication: atrial fibrillation  Allergies  Allergen Reactions   Protective Adhesive Powder Dermatitis    SILK TAPE caused blisters, bleeding, and open wounds.   Wound Dressing Adhesive Dermatitis    SILK TAPE caused blisters, bleeding, and open wounds.    Patient Measurements: Height: 5\' 11"  (180.3 cm) Weight: 88.4 kg (194 lb 14.2 oz) IBW/kg (Calculated) : 75.3  Vital Signs: Temp: 97.5 F (36.4 C) (09/30 0743) Temp Source: Oral (09/30 0743) BP: 114/70 (09/30 0743) Pulse Rate: 92 (09/30 0743)  Labs: Recent Labs    04/01/21 0325 04/02/21 0730 04/03/21 0122  HGB 9.6* 9.7* 9.9*  HCT 28.8* 28.8* 29.1*  PLT 134* 141* 206  CREATININE 1.32* 1.30* 1.36*    Estimated Creatinine Clearance: 48.4 mL/min (A) (by C-G formula based on SCr of 1.36 mg/dL (H)).   Medical History: Past Medical History:  Diagnosis Date   Arthritis    Atrial fibrillation ([place])    Cancer ([place])    PROSTATE   Coronary artery disease    DVT (deep venous thrombosis) ([facility])    Right leg   Dyspnea    Dysrhythmia    Family history of breast cancer    Family history of breast cancer in male    Family history of colon cancer    Family history of prostate cancer    Sleep apnea    Spinal stenosis     Medications:  Scheduled:   acetaminophen  1,000 mg Oral Q6H   Or   acetaminophen (TYLENOL) oral liquid 160 mg/5 mL  1,000 mg Per Tube Q6H   amiodarone  400 mg Oral BID   aspirin EC  81 mg Oral Daily   atorvastatin  40 mg Oral Daily   darolutamide  900 mg Oral Daily   furosemide  40 mg Oral Daily   gabapentin  300 mg Oral QHS   influenza vaccine adjuvanted  0.5 mL Intramuscular Tomorrow-1000   metoprolol tartrate  25 mg Oral BID   Or   metoprolol tartrate  25 mg Per Tube BID   pantoprazole  40 mg Oral Daily   potassium chloride  30 mEq Oral BID   [START ON 04/04/2021] potassium chloride  20 mEq Oral Daily    sodium chloride flush  3 mL Intravenous Q12H   zolpidem  5 mg Oral QHS   Infusions:   sodium chloride      Assessment: 80 YO male on Pradaxa (dabigatran) PTA for AF who presented for elective CABG 9/26. Dabigatran was held for the procedure and enoxaparin initiated in the interim. Last dose of enoxaparin was 9/27 PM. Pharmacy now consulted to resume PTA Pradaxa.    Goal of Therapy:  Monitor platelets by anticoagulation protocol: Yes   Plan:  - Restart dabigatran 150mg  PO twice daily 9/30 AM - Continue to monitor H&H and platelets    Thank you for allowing pharmacy to be a part of this patient's care.  Ardyth Harps, PharmD Clinical Pharmacist

## 2021-04-03 NOTE — TOC Progression Note (Signed)
Transition of Care [facility]) - Progression Note    Patient Details  Name: Connor Duffy MRN: [id number] Date of Birth: Dec 22, 1940  [facility]) CM/SW Contact  Zenon Mayo, RN Phone Number: 04/03/2021, 3:11 PM  Clinical Narrative:    NCM notified by Cardiac rehab patient will need a rolling walker, NCM contacted [facility].  Patient's PCP is Dr. Duke Salvia fax [phone].  NCM faxed the order for the rolling walker and cardiac rehab note and h/p to MD.  NCM received call from Antony Odea and she has consulted the MD to sign off on the walker. The rolling walker will be mailed to him by the [place].        Expected Discharge Plan and Services                                                 Social Determinants of Health (SDOH) Interventions    Readmission Risk Interventions No flowsheet data found.

## 2021-04-03 NOTE — Progress Notes (Signed)
Mobility Specialist Progress Note   04/03/21 1120  Mobility  Activity Ambulated in hall  Level of Assistance Modified independent, requires aide device or extra time  Assistive Device Front wheel walker  Distance Ambulated (ft) 470 ft (450+20)  Mobility Ambulated with assistance in hallway  Mobility Response Tolerated well  Mobility performed by Mobility specialist  $Mobility charge 1 Mobility   Pt received sitting EOB, denies any pain or symptoms. Agreeable to mobility session, reviewed sternal precaution before start. Pt has good spatial awareness with AD and steady gait. X1 standing break d/t DOE, quickly resolved w/ pursed lip breathing. Pt returned to bed w/ exercise physiologist in room, table and call bell by side.  Pre Mobility: 121 HR, 127/87 BP During Mobility: 137 HR  Post Mobility: 126 HR, 137/92 BP  Holland Falling Mobility Specialist Phone Number [phone]

## 2021-04-03 NOTE — TOC Progression Note (Signed)
Transition of Care [facility]) - Progression Note    Patient Details  Name: Connor Duffy MRN: [id number] Date of Birth: 1941-05-24  Transition of [facility]) CM/SW Contact  Zenon Mayo, RN Phone Number: 04/03/2021, 2:47 PM  Clinical Narrative:    NCM left vm with [place] Ar with [facility] to see if we are able to get a rolling walker for patient.  Due to the inclement weather not sure if will hear back from [place] today.          Expected Discharge Plan and Services                                                 Social Determinants of Health (SDOH) Interventions    Readmission Risk Interventions No flowsheet data found.

## 2021-04-03 NOTE — Progress Notes (Signed)
CARDIAC REHAB PHASE I   PRE:  Rate/Rhythm: 117 afib    BP: sitting 102/82    SaO2: 96 RA  MODE:  Ambulation: 400 ft   POST:  Rate/Rhythm: 122 afib    BP: sitting 103/66     SaO2: 97 RA  Pt uncomfortable in bed and wanting to push himself up with his arms. Frustrated with restrictions. Talked with him and his wife regarding sternal precaution. Pt able to move out of bed following precautions. Walked with RW. Steady, fatigue with distance, eager to sit. Needs reminders for sternal precautions as he likes to be independent.   Reviewed ed with pt and wife. Receptive, gave [facility] booklet.  [place], ACSM 04/03/2021 2:29 PM

## 2021-04-04 DIAGNOSIS — I4819 Other persistent atrial fibrillation: Secondary | ICD-10-CM | POA: Diagnosis not present

## 2021-04-04 LAB — BASIC METABOLIC PANEL
Anion gap: 7 (ref 5–15)
BUN: 27 mg/dL — ABNORMAL HIGH (ref 8–23)
CO2: 23 mmol/L (ref 22–32)
Calcium: 7.5 mg/dL — ABNORMAL LOW (ref 8.9–10.3)
Chloride: 103 mmol/L (ref 98–111)
Creatinine, Ser: 1.46 mg/dL — ABNORMAL HIGH (ref 0.61–1.24)
GFR, Estimated: 49 mL/min — ABNORMAL LOW (ref >60)
Glucose, Bld: 118 mg/dL — ABNORMAL HIGH (ref 70–99)
Potassium: 4 mmol/L (ref 3.5–5.1)
Sodium: 133 mmol/L — ABNORMAL LOW (ref 135–145)

## 2021-04-04 MED ORDER — DILTIAZEM HCL ER COATED BEADS 120 MG PO CP24: 120.0000 mg | ORAL_CAPSULE | Freq: Every day | ORAL | 1 refills | Status: DC

## 2021-04-04 MED ORDER — METOPROLOL TARTRATE 25 MG/10 ML ORAL SUSPENSION: 25.0000 mg | Freq: Two times a day (BID) | ORAL | Status: DC

## 2021-04-04 MED ORDER — METOPROLOL TARTRATE 25 MG PO TABS
37.5000 mg | ORAL_TABLET | Freq: Two times a day (BID) | ORAL | Status: DC
  Administered 2021-04-04: 37.5 mg via ORAL
  Filled 2021-04-04: qty 1

## 2021-04-04 MED ORDER — TRAMADOL HCL 50 MG PO TABS: 50.0000 mg | ORAL_TABLET | Freq: Four times a day (QID) | ORAL | 0 refills | Status: DC | PRN

## 2021-04-04 MED ORDER — AMIODARONE HCL 200 MG PO TABS: 200.0000 mg | ORAL_TABLET | Freq: Two times a day (BID) | ORAL | Status: DC

## 2021-04-04 MED ORDER — DILTIAZEM HCL ER COATED BEADS 120 MG PO CP24
120.0000 mg | ORAL_CAPSULE | Freq: Every day | ORAL | Status: DC
  Filled 2021-04-04: qty 1

## 2021-04-04 NOTE — Progress Notes (Signed)
CARDIAC REHAB PHASE I   1010-1041 Patient ambulating in hallway independently and just returned to room. Wife at bedside. Wife with questions about home care including activity progression, assisting with ADLs, Infection prevention. All questions answered. Anticipate discharge today or tomorrow.  Jariel Drost Minus Breeding RN, BSN

## 2021-04-04 NOTE — Progress Notes (Addendum)
[address]       [place] [address]             [phone]      5 Days Post-Op Procedure(s) (LRB): CORONARY ARTERY BYPASS GRAFTING (CABG), ON PUMP, TIMES FOUR, USING LEFT INTERNAL MAMMARY ARTERY AND RIGHT ENDOSCOPICALLY HARVESTED GREATER SAPHENOUS VEIN (N/A) TRANSESOPHAGEAL ECHOCARDIOGRAM (TEE) (N/A) CLIPPING OF ATRIAL USING ATRICURE 40MM CLIP (Left) Subjective: Wants to go home, feels well  Objective: Vital signs in last 24 hours: Temp:  [97.6 F (36.4 C)-99.2 F (37.3 C)] 99.2 F (37.3 C) (10/01 0312) Pulse Rate:  [92-108] 93 (10/01 0312) Cardiac Rhythm: Atrial fibrillation (09/30 1900) Resp:  [16-20] 16 (10/01 0312) BP: (102-137)/(72-92) 102/75 (10/01 0312) SpO2:  [96 %-98 %] 98 % (10/01 0312) Weight:  [88.2 kg] 88.2 kg (10/01 0403)  Hemodynamic parameters for last 24 hours:    Intake/Output from previous day: 09/30 0701 - 10/01 0700 In: 960 [P.O.:960] Out: 4 [Urine:4] Intake/Output this shift: No intake/output data recorded.  General appearance: alert, cooperative, and no distress Heart: irregularly irregular rhythm and tachy Lungs: dim left base Abdomen: benign Extremities: + LE edema, some venous stasis changes Wound: incis healing well  Lab Results: Recent Labs    04/02/21 0730 04/03/21 0122  WBC 7.6 8.8  HGB 9.7* 9.9*  HCT 28.8* 29.1*  PLT 141* 206   BMET:  Recent Labs    04/03/21 0122 04/04/21 0107  NA 131* 133*  K 3.7 4.0  CL 101 103  CO2 21* 23  GLUCOSE 156* 118*  BUN 26* 27*  CREATININE 1.36* 1.46*  CALCIUM 8.0* 7.5*    PT/INR: No results for input(s): LABPROT, INR in the last 72 hours. ABG    Component Value Date/Time   PHART 7.308 (L) 03/30/2021 1843   HCO3 23.8 03/30/2021 1843   TCO2 25 03/30/2021 1843   ACIDBASEDEF 3.0 (H) 03/30/2021 1843   O2SAT 94.0 03/30/2021 1843   CBG (last 3)  Recent Labs    04/02/21 1607 04/02/21 2111 04/03/21 0617  GLUCAP 117* 118* 113*    Meds Scheduled Meds:   acetaminophen  1,000 mg Oral Q6H   Or   acetaminophen (TYLENOL) oral liquid 160 mg/5 mL  1,000 mg Per Tube Q6H   amiodarone  400 mg Oral BID   aspirin EC  81 mg Oral Daily   atorvastatin  40 mg Oral Daily   dabigatran  150 mg Oral Q12H   darolutamide  600 mg Oral BID   furosemide  40 mg Oral Daily   gabapentin  300 mg Oral QHS   influenza vaccine adjuvanted  0.5 mL Intramuscular Tomorrow-1000   metoprolol tartrate  25 mg Oral BID   Or   metoprolol tartrate  25 mg Per Tube BID   pantoprazole  40 mg Oral Daily   potassium chloride  20 mEq Oral Daily   sodium chloride flush  3 mL Intravenous Q12H   zolpidem  5 mg Oral QHS   Continuous Infusions:  sodium chloride     PRN Meds:.sodium chloride, metoprolol tartrate, ondansetron (ZOFRAN) IV, oxyCODONE, simethicone, sodium chloride flush, traMADol  Xrays DG Chest 2 View  Result Date: 04/03/2021 CLINICAL DATA:  Heart surgery, atelectasis EXAM: CHEST - 2 VIEW COMPARISON:  04/01/2021 FINDINGS: Interval removal of left chest tube. No pneumothorax. Left pleural effusion with left base atelectasis or infiltrate. Right lung clear. IMPRESSION: Interval removal of left chest tube without pneumothorax. Small left effusion with left base atelectasis  or infiltrate, similar to prior study. Electronically Signed   By: Rolm Baptise M.D.   On: 04/03/2021 08:31    Assessment/Plan: S/P Procedure(s) (LRB): CORONARY ARTERY BYPASS GRAFTING (CABG), ON PUMP, TIMES FOUR, USING LEFT INTERNAL MAMMARY ARTERY AND RIGHT ENDOSCOPICALLY HARVESTED GREATER SAPHENOUS VEIN (N/A) TRANSESOPHAGEAL ECHOCARDIOGRAM (TEE) (N/A) CLIPPING OF ATRIAL USING ATRICURE 40MM CLIP (Left)  1 afeb, VSS, afib, tachy to 120-130's at times, will increase metoprolol further 2 sats good on RA 3 UOP not recorded, weight stable, not much above preop 4 creat conts slow trend higher- some of edema is chronic, will stop lasix for now Nubeqa doesn't need dose adjustment at this point per  pharmacist 5 conts routine rehab/pulm toilet 6 home soon     LOS: 5 days    Connor Giovanni PA-C Pager [phone] 04/04/2021    Chart reviewed, patient examined, agree with above. He has had persistent atrial fib s/p ablation in past but has had good rate control at home on Cardizem and has been able to work out at the gym without tachycardia. He has been on Tikosyn before but did not maintain sinus. I think it is best to resume his cardizem for rate control, stop metoprolol and amio. Will start cardizem now and if rate control adequate he can go home later today.

## 2021-04-04 NOTE — TOC Transition Note (Signed)
Transition of Care [facility]) - CM/SW Discharge Note   Patient Details  Name: Caspar Favila MRN: [id number] Date of Birth: 1940/08/31  Transition of Care [facility]) CM/SW Contact:  Zenon Mayo, RN Phone Number: 04/04/2021, 12:02 PM   Clinical Narrative:    NCM notified by Cardiac rehab patient will need a rolling walker, NCM contacted [place].  Patient's PCP is Dr. Duke Salvia fax [phone].  NCM faxed the order for the rolling walker and cardiac rehab note and h/p to MD.  NCM received call from Antony Odea and she has consulted the MD to sign off on the walker. The rolling walker will be mailed to him by the [place]. NCM spoke with patient asked if he has another insurance, he states he has medicare, asked if would like to get the walker thru his medicare, he said yes if it will be quicker.  NCM made referral to [facility] with [employer] for the rolling walker , this will be brought up to his room.      Final next level of care: Home/Self Care Barriers to Discharge: No Barriers Identified   Patient Goals and CMS Choice Patient states their goals for this hospitalization and ongoing recovery are:: return home   Choice offered to / list presented to : NA  Discharge Placement                       Discharge Plan and Services                DME Arranged: Walker rolling DME Agency: [employer] Date DME Agency Contacted: 04/04/21 Time DME Agency Contacted: 53 Representative spoke with at DME Agency: [place]: NA          Social Determinants of Health ([place]) Interventions     Readmission Risk Interventions No flowsheet data found.

## 2021-04-04 NOTE — Progress Notes (Signed)
Discharged to home with family office visits in place teaching done   left chest tube sutures in per verbal order form Dr. Cyndia Bent  (verble)

## 2021-04-06 LAB — TYPE AND SCREEN
ABO/RH(D): O POS
Antibody Screen: NEGATIVE
Unit division: 0
Unit division: 0
Unit division: 0
Unit division: 0

## 2021-04-06 LAB — BPAM RBC
Blood Product Expiration Date: 202210282359
Blood Product Expiration Date: 202210282359
Blood Product Expiration Date: 202210282359
Blood Product Expiration Date: 202210282359
ISSUE DATE / TIME: 202209260949
ISSUE DATE / TIME: 202209260949
ISSUE DATE / TIME: 202209260949
ISSUE DATE / TIME: 202209260949
Unit Type and Rh: 5100
Unit Type and Rh: 5100
Unit Type and Rh: 5100
Unit Type and Rh: 5100

## 2021-04-07 ENCOUNTER — Telehealth: Payer: Self-pay

## 2021-04-07 NOTE — Telephone Encounter (Signed)
Patient's wife, Gatha Mayer contacted the office to state patient did not have suture removal appointment scheduled after he was discharged from the hospital s/p CABG x4 with Dr. Roxan Hockey. Suture removal appointment placed and patient aware.  She also stated that he has been walking frequently around the house and last time he walked his heart rate increased to 145 but it would come back down after being seated. She stated that he was not dizzy or lightheaded during this time and he actually felt fine. Advised that this is not uncommon but did advise patient to make sure an appointment was made with his Cardiologist at the [facility] for follow-up. She acknowledged receipt.

## 2021-04-08 ENCOUNTER — Telehealth (HOSPITAL_COMMUNITY): Payer: Self-pay

## 2021-04-08 NOTE — Telephone Encounter (Signed)
Attempted to call patient in regards to Cardiac Rehab - LM on VM Need to advised pt that if he is interested in the cardiac rehab program he needs to call the [place] to get an authorization sent over.

## 2021-04-09 ENCOUNTER — Ambulatory Visit (INDEPENDENT_AMBULATORY_CARE_PROVIDER_SITE_OTHER): Payer: Self-pay

## 2021-04-09 ENCOUNTER — Other Ambulatory Visit: Payer: Self-pay

## 2021-04-09 VITALS — BP 119/66 | HR 72 | Resp 20

## 2021-04-09 DIAGNOSIS — Z4802 Encounter for removal of sutures: Secondary | ICD-10-CM

## 2021-04-09 NOTE — Telephone Encounter (Signed)
Pt has f/u with cardiologist at the [facility] on 04/16/2021

## 2021-04-09 NOTE — Addendum Note (Signed)
Addended by: Charlena Cross F on: 04/09/2021 01:04 PM   Modules accepted: Orders

## 2021-04-09 NOTE — Progress Notes (Signed)
Patient arrived for nurse visit to remove 2 sutures post- procedure CABG x4 with Dr. Roxan Hockey 03/30/21.  Sutures removed with no signs/ symptoms of infection noted.  Patient tolerated procedure well.  Patient/ family instructed to keep the incision sites clean and dry.  Patient/ family acknowledged instructions given.    Patient still experiencing increased heart rate when active. Patient has known history if Afib and was rate controlled on Cardizem before surgery. Patient states that he cannot be very mobile due to his heart racing when walking around. States that his heart rate will go to 140-150's and also experiences dizziness/ lightheadedness. Advised patient to contact his Cardiologist office at the [facility] to get follow-up appointment as soon as possible for possible medication adjustment/treatment. Also, stated that Dr. Roxan Hockey would be notified.

## 2021-04-10 ENCOUNTER — Emergency Department (HOSPITAL_COMMUNITY): Payer: No Typology Code available for payment source

## 2021-04-10 ENCOUNTER — Encounter (HOSPITAL_COMMUNITY): Payer: Self-pay | Admitting: *Deleted

## 2021-04-10 ENCOUNTER — Other Ambulatory Visit: Payer: Self-pay

## 2021-04-10 ENCOUNTER — Emergency Department (HOSPITAL_COMMUNITY)
Admission: EM | Admit: 2021-04-10 | Discharge: 2021-04-11 | Disposition: A | Discharge status: Home / Self Care | Payer: No Typology Code available for payment source | Attending: Emergency Medicine | Admitting: Emergency Medicine

## 2021-04-10 DIAGNOSIS — I4891 Unspecified atrial fibrillation: Secondary | ICD-10-CM | POA: Insufficient documentation

## 2021-04-10 DIAGNOSIS — Z79899 Other long term (current) drug therapy: Secondary | ICD-10-CM | POA: Diagnosis not present

## 2021-04-10 DIAGNOSIS — Z951 Presence of aortocoronary bypass graft: Secondary | ICD-10-CM | POA: Diagnosis not present

## 2021-04-10 DIAGNOSIS — R0602 Shortness of breath: Secondary | ICD-10-CM | POA: Insufficient documentation

## 2021-04-10 DIAGNOSIS — Z96652 Presence of left artificial knee joint: Secondary | ICD-10-CM | POA: Diagnosis not present

## 2021-04-10 DIAGNOSIS — R5383 Other fatigue: Secondary | ICD-10-CM | POA: Diagnosis not present

## 2021-04-10 DIAGNOSIS — Z8546 Personal history of malignant neoplasm of prostate: Secondary | ICD-10-CM | POA: Insufficient documentation

## 2021-04-10 DIAGNOSIS — Z7982 Long term (current) use of aspirin: Secondary | ICD-10-CM | POA: Insufficient documentation

## 2021-04-10 DIAGNOSIS — R42 Dizziness and giddiness: Secondary | ICD-10-CM | POA: Diagnosis not present

## 2021-04-10 DIAGNOSIS — R002 Palpitations: Secondary | ICD-10-CM

## 2021-04-10 DIAGNOSIS — Z96641 Presence of right artificial hip joint: Secondary | ICD-10-CM | POA: Insufficient documentation

## 2021-04-10 DIAGNOSIS — R079 Chest pain, unspecified: Secondary | ICD-10-CM

## 2021-04-10 LAB — BASIC METABOLIC PANEL
Anion gap: 8 (ref 5–15)
BUN: 20 mg/dL (ref 8–23)
CO2: 24 mmol/L (ref 22–32)
Calcium: 8.7 mg/dL — ABNORMAL LOW (ref 8.9–10.3)
Chloride: 103 mmol/L (ref 98–111)
Creatinine, Ser: 1.3 mg/dL — ABNORMAL HIGH (ref 0.61–1.24)
GFR, Estimated: 57 mL/min — ABNORMAL LOW (ref >60)
Glucose, Bld: 115 mg/dL — ABNORMAL HIGH (ref 70–99)
Potassium: 4.4 mmol/L (ref 3.5–5.1)
Sodium: 135 mmol/L (ref 135–145)

## 2021-04-10 LAB — CBC
HCT: 31.7 % — ABNORMAL LOW (ref 39.0–52.0)
Hemoglobin: 9.8 g/dL — ABNORMAL LOW (ref 13.0–17.0)
MCH: 31.1 pg (ref 26.0–34.0)
MCHC: 30.9 g/dL (ref 30.0–36.0)
MCV: 100.6 fL — ABNORMAL HIGH (ref 80.0–100.0)
Platelets: 361 10*3/uL (ref 150–400)
RBC: 3.15 MIL/uL — ABNORMAL LOW (ref 4.22–5.81)
RDW: 16 % — ABNORMAL HIGH (ref 11.5–15.5)
WBC: 9.6 10*3/uL (ref 4.0–10.5)
nRBC: 0 % (ref 0.0–0.2)

## 2021-04-10 LAB — TROPONIN I (HIGH SENSITIVITY)
Troponin I (High Sensitivity): 210 ng/L (ref <18)
Troponin I (High Sensitivity): 195 ng/L (ref <18)

## 2021-04-10 MED ORDER — AMIODARONE HCL 200 MG PO TABS: 200.0000 mg | ORAL_TABLET | Freq: Two times a day (BID) | ORAL | 0 refills | Status: DC

## 2021-04-10 MED ORDER — AMIODARONE HCL 200 MG PO TABS: 400.0000 mg | ORAL_TABLET | Freq: Two times a day (BID) | ORAL | 0 refills | Status: DC

## 2021-04-10 MED ORDER — ETOMIDATE 2 MG/ML IV SOLN
5.0000 mg | Freq: Once | INTRAVENOUS | Status: AC
  Administered 2021-04-10: 5 mg via INTRAVENOUS
  Filled 2021-04-10: qty 10

## 2021-04-10 MED ORDER — ONDANSETRON HCL 4 MG/2ML IJ SOLN
4.0000 mg | Freq: Once | INTRAMUSCULAR | Status: AC
  Administered 2021-04-10: 4 mg via INTRAVENOUS
  Filled 2021-04-10: qty 2

## 2021-04-10 MED ORDER — SODIUM CHLORIDE 0.9 % IV BOLUS
250.0000 mL | Freq: Once | INTRAVENOUS | Status: AC
  Administered 2021-04-10: 250 mL via INTRAVENOUS

## 2021-04-10 MED ORDER — AMIODARONE HCL 200 MG PO TABS
400.0000 mg | ORAL_TABLET | Freq: Once | ORAL | Status: AC
  Administered 2021-04-11: 400 mg via ORAL
  Filled 2021-04-10: qty 2

## 2021-04-10 MED ORDER — SODIUM CHLORIDE 0.9 % IV BOLUS: 1000.0000 mL | Freq: Once | INTRAVENOUS | Status: DC

## 2021-04-10 NOTE — Discharge Instructions (Signed)
Take Amiodarone 400 mg twice daily for 10 days, then amiodarone 200 mg twice daily for 10 days. Follow up with the cardiology electrophysiology clinic at the [facility] in [place] on 10/14.  Return to the ED for new or worsening symptoms including chest pain, shortness of breath, high heart rate, palpitations.

## 2021-04-10 NOTE — ED Provider Notes (Signed)
[facility] EMERGENCY DEPARTMENT Provider Note   CSN: [id number] Arrival date & time: 04/10/21  1453     History Chief Complaint  Patient presents with   Atrial Fibrillation         Connor Duffy is a 80 y.o. male.  HPI This is a 80 year old male with history of A. fib on anticoagulation and diltiazem, metastatic prostate cancer, DVTs, CAD status post CABG who presents with palpitations.  Patient reportedly discharged about a week ago after CABG and has been having palpitations with high heart rate since then.  At cardiac rehab home visit today he was noted to have heart rate in the 130s.  Patient reports this occurs every time he is active even just for a walk down the hall and sometimes occurs at rest.  Resolve spontaneously.  Is associated with shortness of breath, lightheadedness, and fatigue.  Denies associated chest pain.  Denies any syncopal episodes.  Reports compliance with his medications since discharge.    Past Medical History:  Diagnosis Date   Arthritis    Atrial fibrillation ([place])    Cancer ([place])    PROSTATE   Coronary artery disease    DVT (deep venous thrombosis) (HCC)    Right leg   Dyspnea    Dysrhythmia    Family history of breast cancer    Family history of breast cancer in male    Family history of colon cancer    Family history of prostate cancer    Sleep apnea    Spinal stenosis     Patient Active Problem List   Diagnosis Date Noted   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   Genetic testing 05/30/2019   Family history of breast cancer    Family history of prostate cancer    Family history of colon cancer    Family history of breast cancer in male    Chronic anticoagulation    History of deep vein thrombosis    Dysphagia    Acute lower GI bleeding 05/29/2018   Overweight (BMI 25.0-29.9) 05/25/2017   S/P left TKA 05/24/2017   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[place])    Dyspnea 03/14/2015   Atypical chest pain 03/14/2015   PAF (paroxysmal atrial  fibrillation) ([place]) 03/14/2015   Arthritis    Cancer ([place])    Spinal stenosis    Atrial fibrillation ([place])    Degenerative arthritis of hip 07/08/2011    Past Surgical History:  Procedure Laterality Date   BACK SURGERY     spinal stenosis surgery and herniated disc surgery   CARDIAC CATHETERIZATION N/A 04/08/2015   Procedure: Left Heart Cath and Coronary Angiography;  Surgeon: Jerline Pain, MD;  Location: [place] [facility];  Service: Cardiovascular;  Laterality: N/A;   CLIPPING OF ATRIAL APPENDAGE Left 03/30/2021   Procedure: CLIPPING OF ATRIAL USING ATRICURE 40MM CLIP;  Surgeon: Melrose Nakayama, MD;  Location: [place];  Service: Open Heart Surgery;  Laterality: Left;   CORONARY ARTERY BYPASS GRAFT N/A 03/30/2021   Procedure: CORONARY ARTERY BYPASS GRAFTING (CABG), ON PUMP, TIMES FOUR, USING LEFT INTERNAL MAMMARY ARTERY AND RIGHT ENDOSCOPICALLY HARVESTED GREATER SAPHENOUS VEIN;  Surgeon: Melrose Nakayama, MD;  Location: [place];  Service: Open Heart Surgery;  Laterality: N/A;   FLEXIBLE SIGMOIDOSCOPY N/A 05/30/2018   Procedure: FLEXIBLE SIGMOIDOSCOPY;  Surgeon: Ronald Lobo, MD;  Location: [facility] ENDOSCOPY;  Service: Endoscopy;  Laterality: N/A;  unprepped, unsedated   HERNIA REPAIR     inguinal bilateral   JOINT REPLACEMENT  Left total knee Dr. Alvan Dame 05/24/17   KNEE ARTHROSCOPY  LEFT   Also right   PENILE PROSTHESIS IMPLANT     PROSTATECTOMY     TEE WITHOUT CARDIOVERSION N/A 03/30/2021   Procedure: TRANSESOPHAGEAL ECHOCARDIOGRAM (TEE);  Surgeon: Melrose Nakayama, MD;  Location: [place];  Service: Open Heart Surgery;  Laterality: N/A;   TOTAL HIP ARTHROPLASTY  07/08/2011   Procedure: TOTAL HIP ARTHROPLASTY ANTERIOR APPROACH;  Surgeon: Mcarthur Rossetti;  Location: [facility];  Service: Orthopedics;  Laterality: Right;  Right Total Hip Arthroplasty, Direct Anterior Approach   (c-arm)   TOTAL KNEE ARTHROPLASTY Left 05/24/2017   Procedure: LEFT TOTAL KNEE ARTHROPLASTY;  Surgeon:  Paralee Cancel, MD;  Location: [facility];  Service: Orthopedics;  Laterality: Left;  Adductor Block       Family History  Problem Relation Age of Onset   Cancer - Colon Father        dx 48s   Breast cancer Father        unsure age of dx   Breast cancer Sister        dx 55s   Prostate cancer Brother 50    Social History   Tobacco Use   Smoking status: Never   Smokeless tobacco: Never  Vaping Use   Vaping Use: Never used  Substance Use Topics   Alcohol use: No   Drug use: No    Home Medications Prior to Admission medications   Medication Sig Start Date End Date Taking? Authorizing Provider  amiodarone (PACERONE) 200 MG tablet Take 2 tablets (400 mg total) by mouth 2 (two) times daily for 10 days. 04/11/21 04/21/21 Yes Coralee Pesa, MD  amiodarone (PACERONE) 200 MG tablet Take 1 tablet (200 mg total) by mouth 2 (two) times daily. 04/21/21 05/21/21 Yes Coralee Pesa, MD  aspirin EC 81 MG EC tablet Take 1 tablet (81 mg total) by mouth daily. Swallow whole. 04/03/21  Yes Lars Pinks M, PA-C  atorvastatin (LIPITOR) 40 MG tablet Take 40 mg by mouth daily.   Yes [provider]  CHOLECALCIFEROL PO Take 15,000 Units by mouth once a week.   Yes [provider]  dabigatran (PRADAXA) 150 MG CAPS capsule Take 150 mg 2 (two) times daily by mouth.   Yes [provider]  darolutamide (NUBEQA) 300 MG tablet Take 600 mg by mouth 2 (two) times daily.   Yes [provider]  diltiazem (CARDIZEM CD) 120 MG 24 hr capsule Take 1 capsule (120 mg total) by mouth daily. 04/04/21  Yes Gold, Wayne E, PA-C  gabapentin (NEURONTIN) 300 MG capsule Take 300 mg at bedtime by mouth.   Yes [provider]  Zoledronic Acid 4 MG SOLR Inject 3.5 mg into the vein every 3 (three) months. 06/03/20  Yes [provider]  zolpidem (AMBIEN) 5 MG tablet Take 5 mg by mouth at bedtime. 12/24/20  Yes [provider]    Allergies    Protective adhesive  powder and Wound dressing adhesive  Review of Systems   Review of Systems  Constitutional:  Positive for fatigue. Negative for chills and fever.  HENT:  Negative for ear pain and sore throat.   Eyes:  Negative for pain and visual disturbance.  Respiratory:  Positive for shortness of breath. Negative for cough.   Cardiovascular:  Positive for palpitations. Negative for chest pain.  Gastrointestinal:  Negative for abdominal pain and vomiting.  Genitourinary:  Negative for dysuria and hematuria.  Musculoskeletal:  Negative for  arthralgias and back pain.  Skin:  Negative for color change and rash.  Neurological:  Positive for light-headedness. Negative for seizures and syncope.  All other systems reviewed and are negative.  Physical Exam Updated Vital Signs BP 119/81 (BP Location: Right Arm)   Pulse 85   Temp 98.6 F (37 C) (Oral)   Resp (!) 24   Ht 5\' 11"  (1.803 m)   Wt 88.2 kg   SpO2 96%   BMI 27.12 kg/m   Physical Exam Vitals and nursing note reviewed.  Constitutional:      Appearance: He is well-developed.  HENT:     Head: Normocephalic and atraumatic.  Eyes:     Conjunctiva/sclera: Conjunctivae normal.  Cardiovascular:     Rate and Rhythm: Normal rate. Rhythm irregular.     Heart sounds: No murmur heard.    Comments: Surgical wounds to chest are c/d/I.  Pulmonary:     Effort: Pulmonary effort is normal. No respiratory distress.     Breath sounds: Normal breath sounds.  Abdominal:     Palpations: Abdomen is soft.     Tenderness: There is no abdominal tenderness.  Musculoskeletal:     Cervical back: Neck supple.     Right lower leg: No edema.     Left lower leg: No edema.  Skin:    General: Skin is warm and dry.  Neurological:     Mental Status: He is alert.    ED Results / Procedures / Treatments   Labs (all labs ordered are listed, but only abnormal results are displayed) Labs Reviewed  BASIC METABOLIC PANEL - Abnormal; Notable for the following  components:      Result Value   Glucose, Bld 115 (*)    Creatinine, Ser 1.30 (*)    Calcium 8.7 (*)    GFR, Estimated 57 (*)    All other components within normal limits  CBC - Abnormal; Notable for the following components:   RBC 3.15 (*)    Hemoglobin 9.8 (*)    HCT 31.7 (*)    MCV 100.6 (*)    RDW 16.0 (*)    All other components within normal limits  TROPONIN I (HIGH SENSITIVITY) - Abnormal; Notable for the following components:   Troponin I (High Sensitivity) 210 (*)    All other components within normal limits  TROPONIN I (HIGH SENSITIVITY) - Abnormal; Notable for the following components:   Troponin I (High Sensitivity) 195 (*)    All other components within normal limits    EKG EKG Interpretation  Date/Time:  Friday April 10 2021 17:27:03 EDT Ventricular Rate:  103 PR Interval:    QRS Duration: 104 QT Interval:  380 QTC Calculation: 442 R Axis:   -32 Text Interpretation: Atrial flutter/fibrillation Ventricular premature complex Left axis deviation RSR' in V1 or V2, probably normal variant Nonspecific T abnormalities, lateral leads Confirmed by Lennice Sites [phone]) on 04/10/2021 6:44:40 PM  Radiology DG Chest 1 View  Result Date: 04/10/2021 CLINICAL DATA:  Atrial fibrillation EXAM: CHEST  1 VIEW COMPARISON:  None. FINDINGS: Sternal wires overlie enlarged cardiac silhouette. There is chronic elevation LEFT hemidiaphragm. LEFT basilar atelectasis unchanged. RIGHT lung clear. IMPRESSION: 1. No interval change from 04/03/2021. 2. Chronic elevation of the LEFT hemidiaphragm with associated atelectasis Electronically Signed   By: Suzy Bouchard M.D.   On: 04/10/2021 16:19    Procedures .Cardioversion  Date/Time: 04/10/2021 11:40 PM Performed by: Coralee Pesa, MD Authorized by: Lennice Sites, DO   Consent:  Consent obtained:  Written   Consent given by:  Patient   Risks discussed:  Cutaneous burn, death, induced arrhythmia and pain   Alternatives discussed:   No treatment, rate-control medication and alternative treatment Pre-procedure details:    Cardioversion basis:  Elective   Rhythm:  Atrial fibrillation   Electrode placement:  Anterior-lateral Patient sedated: Yes. Refer to sedation procedure documentation for details of sedation.  Attempt one:    Cardioversion mode:  Synchronous   Waveform:  Biphasic   Shock (Joules):  150   Shock outcome:  No change in rhythm Attempt two:    Cardioversion mode:  Synchronous   Waveform:  Biphasic   Shock (Joules):  150   Shock outcome:  Conversion to normal sinus rhythm Post-procedure details:    Patient status:  Awake   Patient tolerance of procedure:  Tolerated well, no immediate complications .Sedation  Date/Time: 04/10/2021 11:40 PM Performed by: Coralee Pesa, MD Authorized by: Lennice Sites, DO   Universal protocol:    Procedure explained and questions answered to patient or proxy's satisfaction: yes     Immediately prior to procedure, a time out was called: yes     Patient identity confirmed:  Arm band Indications:    Procedure performed:  Cardioversion   Procedure necessitating sedation performed by:  Physician performing sedation Pre-sedation assessment:    Time since last food or drink:  3 hours   ASA classification: class 3 - patient with severe systemic disease     Mouth opening:  3 or more finger widths   Thyromental distance:  4 finger widths   Mallampati score:  I - soft palate, uvula, fauces, pillars visible   Neck mobility: normal     Pre-sedation assessments completed and reviewed: airway patency, cardiovascular function, mental status, nausea/vomiting and respiratory function     Pre-sedation assessment completed:  04/10/2021 11:09 PM Immediate pre-procedure details:    Reassessment: Patient reassessed immediately prior to procedure     Reviewed: vital signs, relevant labs/tests and NPO status     Verified: bag valve mask available, emergency equipment available,  intubation equipment available, IV patency confirmed, oxygen available and suction available   Procedure details (see MAR for exact dosages):    Preoxygenation:  Nasal cannula   Sedation:  Etomidate   Intended level of sedation: deep   Analgesia:  None   Intra-procedure monitoring:  Blood pressure monitoring, cardiac monitor, continuous pulse oximetry, continuous capnometry, frequent LOC assessments and frequent vital sign checks   Intra-procedure events: none     Total Provider sedation time (minutes):  10 Post-procedure details:    Post-sedation assessment completed:  04/10/2021 11:15 PM   Attendance: Constant attendance by certified staff until patient recovered     Recovery: Patient returned to pre-procedure baseline     Post-sedation assessments completed and reviewed: airway patency, cardiovascular function, mental status, nausea/vomiting and pain level     Patient is stable for discharge or admission: yes     Procedure completion:  Tolerated well, no immediate complications   Medications Ordered in ED Medications  etomidate (AMIDATE) injection 5 mg (5 mg Intravenous Given 04/10/21 2313)  ondansetron (ZOFRAN) injection 4 mg (4 mg Intravenous Given 04/10/21 2313)  sodium chloride 0.9 % bolus 250 mL (0 mLs Intravenous Stopped 04/10/21 2330)  amiodarone (PACERONE) tablet 400 mg (400 mg Oral Given 04/11/21 0005)    ED Course  I have reviewed the triage vital signs and the nursing notes.  Pertinent labs & imaging results that  were available during my care of the patient were reviewed by me and considered in my medical decision making (see chart for details).    MDM Rules/Calculators/A&P                          On arrival patient is hemodynamically stable and well-appearing.  EKG in triage consistent with A. fib with RVR with rate in the 130s.  Heart rate now in the 90s, rhythm consistent with A. fib.  Patient asymptomatic on my exam while at rest. No clear precipitating factor for A.  fib with RVR including infection or electrolyte derangement.  CXR is without acute pathology,findings, no evidence of contributing cardiopulmonary abnormality.  No evidence of acute ischemia on EKG. initial troponin elevated at 200, expected after recent CABG and repeat trends down to 195. No evidence of acute ACS. CBC and BMP also unremarkable.   Cardiology consulted for further evaluation.  They evaluated patient at bedside and recommend cardioversion.  Patient and family are agreeable.  Patient has had atrial clipping procedure and has been on anticoagulation since most recent echo demonstrating no clots.  Consented for procedure as above.  Converted to sinus rhythm after 2 shocks at 150 J.    Awake and alert on reassessment.  Remains in sinus rhythm with rate in the 80s.  Does have a prolonged PR interval at 275 on most recent EKG, however this is consistent with patient's prior EKGs.  There is also evidence of some ectopy with frequent PVCs, spoke with [facility] cardiology again who is scheduled follow-up in electrophysiology clinic.  From my perspective patient is stable for discharge on amiodarone.   Patient discharged home in stable condition.  Given strict return precautions.  He expressed understanding. Follow up scheduled for 10/14.     Final Clinical Impression(s) / ED Diagnoses Final diagnoses:  Atrial fibrillation with RVR ([place])  Palpitations    Rx / DC Orders ED Discharge Orders          Ordered    amiodarone (PACERONE) 200 MG tablet  2 times daily        04/10/21 2354    amiodarone (PACERONE) 200 MG tablet  2 times daily        04/10/21 2354             Coralee Pesa, MD 04/11/21 0015    Lennice Sites, DO 04/11/21 0041

## 2021-04-10 NOTE — ED Provider Notes (Addendum)
I have personally seen and examined the patient. I have reviewed the documentation on PMH/FH/Soc Hx. I have discussed the plan of care with the resident and patient.  I have reviewed and agree with the resident's documentation. Please see associated encounter note.  Briefly, the patient is a 80 y.o. male here with palpitations, fast heart rate.  Recent CABG.  History of atrial fibrillation on Pradaxa and diltiazem.  Had A. fib with RVR shortly after his CABG was on amiodarone inpatient but currently back on his home dose of diltiazem and Pradaxa.  Pradaxa was held prior to the CABG.  He has been having some intermittent runs of atrial fibrillation with RVR.  When he first came in first EKG showed atrial fibrillation with RVR in the 130s however upon evaluation back in the room heart rate is more in the 80s and 90s.  Troponin stable at 210 and 195.  He states that he has been having these episodes for the past week.  Lab work is otherwise unremarkable.  Is not having any chest pain or shortness of breath currently but during the episodes he can feel it.  Chest x-ray is unremarkable.  CT surgery was consulted by my resident and they recommend cardiology consultation.  We will consult cardiology and get further recommendations from them.  At this time he is not in RVR but would likely benefit from more rate control.  Please see my resident note for further results, evaluation, disposition of the patient.  Cardiology was consulted and they recommended cardioversion.  Patient was given etomidate and cardioverted successfully to normal sinus rhythm.  Slightly prolonged PR interval but same as prior EKGs.  Patient was doing well after sedation.  Cardiology recommend 400 mg amiodarone twice daily and will help arrange for follow-up with EP.  Patient to be discharged.  I was there for supervision of both cardioversion and sedation that was performed by my resident.  This chart was dictated using voice recognition  software.  Despite best efforts to proofread,  errors can occur which can change the documentation meaning.    EKG Interpretation  Date/Time:  Friday April 10 2021 17:27:03 EDT Ventricular Rate:  103 PR Interval:    QRS Duration: 104 QT Interval:  380 QTC Calculation: 442 R Axis:   -32 Text Interpretation: Atrial flutter/fibrillation Ventricular premature complex Left axis deviation RSR' in V1 or V2, probably normal variant Nonspecific T abnormalities, lateral leads Confirmed by Lennice Sites [phone]) on 04/10/2021 6:44:40 PM       .Critical Care Performed by: Lennice Sites, DO Authorized by: Lennice Sites, DO   Critical care provider statement:    Critical care time (minutes):  35   Critical care time was exclusive of:  Separately billable procedures and treating other patients and teaching time   Critical care was necessary to treat or prevent imminent or life-threatening deterioration of the following conditions:  Cardiac failure (atrial fibrillation with rvr)   Critical care was time spent personally by me on the following activities:  Blood draw for specimens, development of treatment plan with patient or surrogate, discussions with primary provider, evaluation of patient's response to treatment, examination of patient, obtaining history from patient or surrogate, ordering and performing treatments and interventions, ordering and review of laboratory studies, ordering and review of radiographic studies, pulse oximetry, re-evaluation of patient's condition and review of old charts    Lennice Sites, DO 04/10/21 [place], [place], DO 04/10/21 2345

## 2021-04-10 NOTE — ED Provider Notes (Signed)
Emergency Medicine Provider Triage Evaluation Note  Connor Duffy , a 80 y.o. male  was evaluated in triage.  Pt complains of heart palpitation.  Review of Systems  Positive: Fast heart rate, lightheaded, dizzy, cp, weak Negative: Fever, cough  Physical Exam  BP 118/70 (BP Location: Left Arm)   Pulse 76   Temp 98.8 F (37.1 C) (Oral)   Resp 14   Ht 5\' 11"  (1.803 m)   Wt 88.2 kg   SpO2 99%   BMI 27.12 kg/m  Gen:   Awake, no distress   Resp:  Normal effort  MSK:   Moves extremities without difficulty  Other:  Irregularly irregular heart rate, tachycardic  Medical Decision Making  Medically screening exam initiated at 3:23 PM.  Appropriate orders placed.  Connor Duffy was informed that the remainder of the evaluation will be completed by another provider, this initial triage assessment does not replace that evaluation, and the importance of remaining in the ED until their evaluation is complete.  Hx of afib currently on pradaxa.  Had CABG last month, have been having intermittent heart palpitation with exertion and now having fast heart rate throughout the day.  CT surgeon Dr. Roxan Hockey recommend ER eval.  EKG shows afib with RVR   Domenic Moras, PA-C 04/10/21 1525    Regan Lemming, MD 04/10/21 1925

## 2021-04-10 NOTE — Plan of Care (Signed)
Mr Binstock is a 69 YOM hx paroxysmal afib s/p PVI ablation with previous failure of sotalol therapy, Cad s/p CABG and LAAO 03/2021. Went into post-operative afib and was briefly on amiodarone and restarted within 48 hours on pradaxa which he has taken since; discharged on pradaxa and diltiazem. Developing more RVR and went at urging of CV surgery to the ED. Intermittently in RVR to the 130s especially with exertion Recommend cardioversion in the ED followed by initiation of amiodarone 400 mg PO BID (give first dose tonight) for 10 days then 200 mg PO daily. Continue diltiazem 120 mg PO daily and pradaxa 150 daily. Patient should be instructed to monitor HR at home and if HR <50 decrease diltiazem to 60 mg PO daily He has follow-up on 10/13 with the [facility] for further management of pAfib including consideration of repeat ablation

## 2021-04-10 NOTE — ED Triage Notes (Signed)
The pt is having  af a rhy that he has been in the past  he had a cabg this past Monday  and he has had this rhy since the surgery

## 2021-04-10 NOTE — ED Notes (Signed)
Surgery was on sept 26th not this past monday

## 2021-04-14 DIAGNOSIS — Z48812 Encounter for surgical aftercare following surgery on the circulatory system: Secondary | ICD-10-CM | POA: Diagnosis not present

## 2021-04-27 ENCOUNTER — Other Ambulatory Visit: Payer: Self-pay | Admitting: Thoracic Surgery (Cardiothoracic Vascular Surgery)

## 2021-04-27 DIAGNOSIS — Z951 Presence of aortocoronary bypass graft: Secondary | ICD-10-CM

## 2021-04-28 ENCOUNTER — Encounter: Payer: Self-pay | Admitting: Thoracic Surgery (Cardiothoracic Vascular Surgery)

## 2021-04-28 ENCOUNTER — Ambulatory Visit
Admission: RE | Admit: 2021-04-28 | Discharge: 2021-04-28 | Disposition: A | Discharge status: Home / Self Care | Payer: No Typology Code available for payment source | Source: Ambulatory Visit | Attending: Thoracic Surgery (Cardiothoracic Vascular Surgery) | Admitting: Thoracic Surgery (Cardiothoracic Vascular Surgery)

## 2021-04-28 ENCOUNTER — Ambulatory Visit (INDEPENDENT_AMBULATORY_CARE_PROVIDER_SITE_OTHER): Payer: Self-pay | Admitting: Thoracic Surgery (Cardiothoracic Vascular Surgery)

## 2021-04-28 ENCOUNTER — Other Ambulatory Visit: Payer: Self-pay

## 2021-04-28 ENCOUNTER — Other Ambulatory Visit: Payer: Self-pay | Admitting: *Deleted

## 2021-04-28 VITALS — BP 121/68 | HR 68 | Resp 20 | Ht 71.0 in | Wt 187.0 lb

## 2021-04-28 DIAGNOSIS — Z951 Presence of aortocoronary bypass graft: Secondary | ICD-10-CM

## 2021-04-28 NOTE — Progress Notes (Signed)
      [address]       [place] [address]             [phone]     HPI:    Past Medical History:  Diagnosis Date  . Arthritis   . Atrial fibrillation ([place])   . Cancer ([place])    PROSTATE  . Coronary artery disease   . DVT (deep venous thrombosis) ([facility])    Right leg  . Dyspnea   . Dysrhythmia   . Family history of breast cancer   . Family history of breast cancer in male   . Family history of colon cancer   . Family history of prostate cancer   . Sleep apnea   . Spinal stenosis     Current Outpatient Medications  Medication Sig Dispense Refill  . amiodarone (PACERONE) 200 MG tablet Take 1 tablet (200 mg total) by mouth 2 (two) times daily. 60 tablet 0  . aspirin EC 81 MG EC tablet Take 1 tablet (81 mg total) by mouth daily. Swallow whole. 30 tablet 11  . atorvastatin (LIPITOR) 40 MG tablet Take 40 mg by mouth daily.    . CHOLECALCIFEROL PO Take 15,000 Units by mouth once a week.    . dabigatran (PRADAXA) 150 MG CAPS capsule Take 150 mg 2 (two) times daily by mouth.    . darolutamide (NUBEQA) 300 MG tablet Take 600 mg by mouth 2 (two) times daily.    Marland Kitchen gabapentin (NEURONTIN) 300 MG capsule Take 300 mg at bedtime by mouth.    . Zoledronic Acid 4 MG SOLR Inject 3.5 mg into the vein every 3 (three) months.    . zolpidem (AMBIEN) 5 MG tablet Take 5 mg by mouth at bedtime.     No current facility-administered medications for this visit.    Physical Exam BP 121/68   Pulse 68   Resp 20   Ht 5\' 11"  (1.803 m)   Wt 187 lb (84.8 kg)   SpO2 98% Comment: RA  BMI 26.08 kg/m    Diagnostic Tests:   Impression:  Plan: Return in 6 months with PA and lateral chest x-ray  Melrose Nakayama, MD [facility] [phone]

## 2021-04-28 NOTE — Progress Notes (Signed)
[address]       [place] [address]             [phone]      HPI: Mr. Chaloux returns for scheduled postoperative follow-up visit  Alexx Mcburney is a 80 year old man with a history of atrial fibrillation, DVT, hypertension, prostate cancer, arthritis, hyperlipidemia, and CAD.  He had undergone an ablation for atrial fibrillation in May 2020.  That was not successful.  He presented with accelerating angina.  He had a positive nuclear stress test.  At catheterization he had three-vessel disease.  I did coronary artery bypass grafting x4 and a left atrial appendage clip on 03/30/2021.  Postoperatively he went into atrial fibrillation.  He went home on amiodarone.  He returned to the ED with A. fib with RVR on 04/10/2021.  He he had a cardioversion.  Initially after the cardioversion he was having episodes of bradycardia.  That has now resolved.  He denies incisional pain.  No significant shortness of breath.  He has been exercising on a regular basis.  Current Outpatient Medications  Medication Sig Dispense Refill   amiodarone (PACERONE) 200 MG tablet Take 1 tablet (200 mg total) by mouth 2 (two) times daily. 60 tablet 0   aspirin EC 81 MG EC tablet Take 1 tablet (81 mg total) by mouth daily. Swallow whole. 30 tablet 11   atorvastatin (LIPITOR) 40 MG tablet Take 40 mg by mouth daily.     CHOLECALCIFEROL PO Take 15,000 Units by mouth once a week.     dabigatran (PRADAXA) 150 MG CAPS capsule Take 150 mg 2 (two) times daily by mouth.     darolutamide (NUBEQA) 300 MG tablet Take 600 mg by mouth 2 (two) times daily.     gabapentin (NEURONTIN) 300 MG capsule Take 300 mg at bedtime by mouth.     Zoledronic Acid 4 MG SOLR Inject 3.5 mg into the vein every 3 (three) months.     zolpidem (AMBIEN) 5 MG tablet Take 5 mg by mouth at bedtime.     No current facility-administered medications for this visit.    Physical Exam BP 121/68   Pulse 68   Resp 20   Ht 5\' 11"  (1.803 m)    Wt 187 lb (84.8 kg)   SpO2 98% Comment: RA  BMI 26.51 kg/m  80 year old man in no acute distress Alert and oriented x3 with no focal deficits Diminished breath sounds at left base but otherwise clear Cardiac irregular with normal rate Sternum stable, incision healing well Leg incisions healing well, no peripheral edema  Diagnostic Tests: I personally reviewed his chest x-ray.  Shows elevated left hemidiaphragm.  Impression: Donnald Tabar is a 80 year old man with a history of atrial fibrillation, DVT, hypertension, prostate cancer, arthritis, hyperlipidemia, and CAD.   CAD-severe three-vessel disease with angina.  Status post coronary bypass grafting x4.  No recurrent angina.  Status post coronary bypass grafting-now about a month out from procedure.  Should not lifting over 10 pounds for another 2 weeks.  Other than that activities are unrestricted.  He may begin driving.  Appropriate precautions were discussed.  Elevated left hemidiaphragm-phrenic nerve dysfunction.  Occurs in 1 to 2% of patients after bypass surgery.  About 90% of the time this will recover on its own.  He is minimally symptomatic.  We will repeat a chest x-ray in about 6 months to check on that.  If it remains elevated we can discuss plication depending  on his symptoms.  Atrial fibrillation-normal rate today.  Has a follow-up appointment with cardiology at [facility] in about 2 weeks.  Plan: Follow-up with cardiology at [facility] Return in 6 months with chest x-ray to follow-up elevated left hemidiaphragm  Melrose Nakayama, MD [facility] [phone]

## 2021-04-29 ENCOUNTER — Telehealth (HOSPITAL_COMMUNITY): Payer: Self-pay | Admitting: *Deleted

## 2021-04-29 NOTE — Telephone Encounter (Signed)
Pt completed his initial appt with the [facility] Cardiology clinic per this veteran.  Pt seen in follow up on 10/25 by Dr. Roxan Hockey, surgeon.  Pt making good progress and from surgical viewpoint, ready for cardiac rehab.  Found comprehensive VA authorization - [id number] which covers cardiac rehab.  Called and spoke with pt who is interested in participating.  He is waiting on the [place] to call him with the date/time of his appt with cardiology. Once completed will request from the [place] a copy of the clinic notes, EKG, dates of the authorization along with clean copy of the authorization as the one that is scanned in media is difficulty to read. Pt verbalized understanding. Cherre Huger, BSN Cardiac and Training and development officer

## 2021-06-04 ENCOUNTER — Telehealth (HOSPITAL_COMMUNITY): Payer: Self-pay

## 2021-06-04 NOTE — Telephone Encounter (Signed)
Called the [place] and spoke with the community care team and advised her that I needed an authorization sent over. Then I called Leveda Anna the nurse for the [place] care team and left a message stating that we needed the last couple of office notes when pt seen his cardiologist there and a recent EKG. Called pt and advised him that I have reached out to the [place] for additional information.

## 2021-06-05 NOTE — Telephone Encounter (Signed)
Received the new authorization from the [place] for pt to participate in the cardiac rehab. Placed pt ppw back in the need appt bin.

## 2021-06-11 NOTE — Telephone Encounter (Signed)
Pt called wanting to schedule for cardiac rehab, I advised pt that we are still waiting on his recent EKG notes. Pt stated that he was last seen by his cardiologist on 11/28. I advised pt that we are needing those notes as well. We have received pt VA authorization along with 11/10 cardiology notes and pt had a EKG done on 11/10 which we also need. I advised pt that I have called [facility] and left a message for Leveda Anna the nurse to send over notes with no response. Pt then gave me the nurse that's working with his case named Raymon Mutton and provided me with her phone number. [phone]) I gave her a call and left a detailed message of all the information we are needing for the pt to start cardiac rehab. The nurse navigator has the pt ppw.

## 2021-06-26 NOTE — Telephone Encounter (Signed)
Pt is no longer interested in the cardiac rehab program. Closed referral.

## 2021-09-22 ENCOUNTER — Encounter: Payer: Self-pay | Admitting: Thoracic Surgery (Cardiothoracic Vascular Surgery)

## 2021-10-26 ENCOUNTER — Other Ambulatory Visit: Payer: Self-pay | Admitting: Thoracic Surgery (Cardiothoracic Vascular Surgery)

## 2021-10-26 DIAGNOSIS — Z951 Presence of aortocoronary bypass graft: Secondary | ICD-10-CM

## 2021-10-27 ENCOUNTER — Encounter: Payer: Self-pay | Admitting: Thoracic Surgery (Cardiothoracic Vascular Surgery)

## 2021-10-27 ENCOUNTER — Ambulatory Visit
Admission: RE | Admit: 2021-10-27 | Discharge: 2021-10-27 | Disposition: A | Discharge status: Home / Self Care | Payer: Medicare HMO | Source: Ambulatory Visit | Attending: Thoracic Surgery (Cardiothoracic Vascular Surgery) | Admitting: Thoracic Surgery (Cardiothoracic Vascular Surgery)

## 2021-10-27 ENCOUNTER — Ambulatory Visit (INDEPENDENT_AMBULATORY_CARE_PROVIDER_SITE_OTHER): Payer: No Typology Code available for payment source | Admitting: Thoracic Surgery (Cardiothoracic Vascular Surgery)

## 2021-10-27 VITALS — BP 130/84 | HR 78 | Resp 20 | Ht 71.0 in | Wt 176.0 lb

## 2021-10-27 DIAGNOSIS — Z951 Presence of aortocoronary bypass graft: Secondary | ICD-10-CM

## 2021-10-27 NOTE — Progress Notes (Signed)
? ?   ?[address] ?      [place] [address] ?            [phone]   ? ?HPI: Mr. Vera returns for follow-up of an elevated left hemidiaphragm post CABG ? ?Brailon Don is a 81 year old man with a history of atrial fibrillation, DVT, hypertension, prostate cancer, arthritis, hyperlipidemia, coronary disease, CABG, and carcinoid tumor of the liver.  He presented back in September with accelerating angina.  He underwent coronary bypass grafting x4 and left atrial appendage clipping on 03/26/2021.  He did have some issues with atrial fibrillation postoperatively. ? ?I saw him back in October.  He was doing well at that time but did have elevation of his left hemidiaphragm.  He now returns for follow-up of that. ? ?In the interim since then he has been diagnosed with a carcinoid tumor of the liver.  He is being followed through the [facility] and [facility] for that.  He still has not returned to his baseline exercise tolerance and gets short of breath with less exertion than he did preoperatively.  Not having any anginal pain and no incisional pain. ? ?Past Medical History:  ?Diagnosis Date  ? Arthritis   ? Atrial fibrillation ([place])   ? Cancer [facility])   ? PROSTATE  ? Coronary artery disease   ? DVT (deep venous thrombosis) ([place])   ? Right leg  ? Dyspnea   ? Dysrhythmia   ? Family history of breast cancer   ? Family history of breast cancer in male   ? Family history of colon cancer   ? Family history of prostate cancer   ? Sleep apnea   ? Spinal stenosis   ? ? ?Current Outpatient Medications  ?Medication Sig Dispense Refill  ? aspirin EC 81 MG EC tablet Take 1 tablet (81 mg total) by mouth daily. Swallow whole. 30 tablet 11  ? CHOLECALCIFEROL PO Take 15,000 Units by mouth once a week.    ? dabigatran (PRADAXA) 150 MG CAPS capsule Take 150 mg 2 (two) times daily by mouth.    ? darolutamide (NUBEQA) 300 MG tablet Take 600 mg by mouth 2 (two) times daily.    ? gabapentin (NEURONTIN) 300 MG capsule Take 300  mg at bedtime by mouth.    ? Relugolix 120 MG TABS 120 mg.    ? Zoledronic Acid 4 MG SOLR Inject 3.5 mg into the vein every 3 (three) months.    ? zolpidem (AMBIEN) 5 MG tablet Take 5 mg by mouth at bedtime.    ? ?No current facility-administered medications for this visit.  ? ? ?Physical Exam ?BP 130/84 (BP Location: Left Arm, Patient Position: Sitting, Cuff Size: Normal)   Pulse 78   Resp 20   Ht '5\' 11"'$  (1.803 m)   Wt 176 lb (79.8 kg)   SpO2 98% Comment: RA  BMI 24.33 kg/m?  ?81 year old man in no acute distress ?Alert and oriented x3 with no focal deficits ?Lungs diminished breath sounds at left base, otherwise clear ?Cardiac regular rate and rhythm ?Sternum stable ? ?Diagnostic Tests: ?CHEST - 2 VIEW ?  ?COMPARISON:  04/28/2021. ?  ?FINDINGS: ?Status post median sternotomy CABG procedure. Left apical clipping. ?Unchanged elevation of the left hemidiaphragm with overlying ?scarring. Resolution of previous left pleural effusion. No signs of ?CHF. There is a vague airspace opacity identified within the right ?midlung which measures approximately 2.7 cm and appears new from ?previous exam. ?  ?IMPRESSION: ?1.  New vague airspace opacity within the right midlung. ?Indeterminate. Early focus of inflammation/infection is not ?excluded. Followup PA and lateral chest X-ray is recommended in 3-4 ?weeks to ensure resolution and exclude underlying malignancy. ?2. Resolution of previous left pleural effusion. ?3. No change in asymmetric elevation of left hemidiaphragm now with ?overlying scarring. ?  ?  ?Electronically Signed ?  By: Kerby Moors M.D. ?  On: 10/27/2021 09:29 ?I personally reviewed the chest x-ray images.  There is a vague density in the right lung.  Left hemidiaphragm remains elevated.  I think is actually better than it was on his previous film but some that could be degree of inspiration.  Left effusion has resolved. ? ?Impression: ?Connor Duffy is a 81 year old man with a history of atrial  fibrillation, DVT, hypertension, prostate cancer, arthritis, hyperlipidemia, coronary disease, CABG, and carcinoid tumor of the liver.  He underwent coronary bypass grafting x4 and left atrial appendage clipping in September 2022.  Postoperatively he had some atrial fibrillation and also was found to have an elevated left hemidiaphragm. ? ?His chest x-ray today that elevation of the left hemidiaphragm appears improved.  There is been resolution of the effusion but on top of that there is more space between the top of the diaphragm and the fixed things on the chest x-ray such as some clips and the left atrial appendage clip.  That being said it certainly has not returned to normal.  Hopefully will continue to improve with time. ? ?There is a vague opacity in the right midlung.  He is not having any cough or fever.  We will obtain another chest x-ray in a month.  If not resolved we will consider a CT. ? ?Plan: ?Return in 1 month with PA and lateral chest x-ray ? ?Melrose Nakayama, MD ?[facility] ?[phone] ? ? ? ?

## 2021-11-19 ENCOUNTER — Telehealth: Payer: Self-pay

## 2021-11-19 NOTE — Telephone Encounter (Signed)
In charting for next Friday's appointment it was discovered that patient has already been seen by [facility] on 11-10-21, Dr.Brent T Cengia. And he has an established relationship with this provider since 09-2020.  Lm for patient to call back and confirm we can cancel  appointment with Dr. Havery Moros.

## 2021-11-27 ENCOUNTER — Ambulatory Visit: Payer: Medicare HMO | Admitting: Gastroenterology

## 2021-12-01 ENCOUNTER — Ambulatory Visit: Payer: No Typology Code available for payment source | Admitting: Thoracic Surgery (Cardiothoracic Vascular Surgery)

## 2022-01-11 ENCOUNTER — Other Ambulatory Visit: Payer: Self-pay | Admitting: Thoracic Surgery (Cardiothoracic Vascular Surgery)

## 2022-01-11 DIAGNOSIS — Z951 Presence of aortocoronary bypass graft: Secondary | ICD-10-CM

## 2022-01-12 ENCOUNTER — Encounter: Payer: Self-pay | Admitting: Thoracic Surgery (Cardiothoracic Vascular Surgery)

## 2022-01-12 ENCOUNTER — Ambulatory Visit
Admission: RE | Admit: 2022-01-12 | Discharge: 2022-01-12 | Disposition: A | Discharge status: Home / Self Care | Payer: No Typology Code available for payment source | Source: Ambulatory Visit | Attending: Thoracic Surgery (Cardiothoracic Vascular Surgery) | Admitting: Thoracic Surgery (Cardiothoracic Vascular Surgery)

## 2022-01-12 ENCOUNTER — Ambulatory Visit (INDEPENDENT_AMBULATORY_CARE_PROVIDER_SITE_OTHER): Payer: No Typology Code available for payment source | Admitting: Thoracic Surgery (Cardiothoracic Vascular Surgery)

## 2022-01-12 ENCOUNTER — Other Ambulatory Visit: Payer: Self-pay | Admitting: *Deleted

## 2022-01-12 VITALS — BP 100/62 | HR 92 | Resp 20 | Ht 71.0 in | Wt 178.7 lb

## 2022-01-12 DIAGNOSIS — I25119 Atherosclerotic heart disease of native coronary artery with unspecified angina pectoris: Secondary | ICD-10-CM | POA: Diagnosis not present

## 2022-01-12 DIAGNOSIS — Z951 Presence of aortocoronary bypass graft: Secondary | ICD-10-CM | POA: Diagnosis not present

## 2022-01-12 DIAGNOSIS — R911 Solitary pulmonary nodule: Secondary | ICD-10-CM

## 2022-01-12 NOTE — Progress Notes (Signed)
[address]       [place] [address]             [phone]       HPI: Mr. Connor Duffy returns for follow-up of nodule seen on a chest x-ray at his most recent visit.  Connor Duffy is a 81 year old man with a history of atrial fibrillation, DVT, hypertension, prostate cancer, arthritis, hyperlipidemia, CAD, CABG, and carcinoid tumor of the liver.  He presented in September 2022 with accelerating angina.  He underwent coronary bypass grafting x4 and left atrial appendage clipping on 03/26/2021.  I saw him back in October 2022 he was doing well but had a elevated left hemidiaphragm.  I saw him back in the office in April with a chest x-ray.  There was some improvement of the elevated hemidiaphragm there also was a shadow seen in the right lung.  He now returns for follow-up of that.  In the interim he had a CT scan done at the [facility].  He feels well.  No recurrent chest pain.  No shortness of breath.  Past Medical History:  Diagnosis Date   Arthritis    Atrial fibrillation ([place])    Cancer ([place]) 2002   PROSTATE   Coronary artery disease    Diverticulosis 12/23/2017   colonscopy [facility] - Dr. 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 Swallow whole. 30 tablet 11   CHOLECALCIFEROL PO Take 15,000 Units by mouth once a week.     dabigatran (PRADAXA) 150 MG CAPS capsule Take 150 mg 2 (two) times daily by mouth.     darolutamide (NUBEQA) 300 MG tablet Take 600 mg by mouth 2 (two)  times daily.     gabapentin (NEURONTIN) 300 MG capsule Take 300 mg at bedtime by mouth.     Relugolix 120 MG TABS 120 mg.     Zoledronic Acid 4 MG SOLR Inject 3.5 mg into the vein every 3 (three) months.     zolpidem (AMBIEN) 5 MG tablet Take 5 mg by mouth at bedtime.     No current facility-administered medications for this visit.    Physical Exam BP 100/62 (BP Location: Left Arm, Patient Position: Sitting, Cuff Size: Normal)   Pulse 92   Resp 20   Ht '5\' 11"'$  (1.803 m)   Wt 178 lb 11.2 oz (81.1 kg)   SpO2 95% Comment: RA  BMI 24.65 kg/m  81 year old man in no acute distress Alert and oriented x3 with no focal deficits Well-developed and well-nourished  Diagnostic Tests: CHEST - 2 VIEW   COMPARISON:  October 27, 2021.   FINDINGS: Post median sternotomy for CABG. Post LEFT atrial appendage clipping.   Cardiomediastinal contours and hilar structures are stable.   LEFT hemidiaphragm remains elevated.   There is juxta diaphragmatic atelectasis in  the LEFT chest. No sign of pleural effusion. RIGHT chest is hyperinflated.   Nodular opacity along the minor fissure in the RIGHT chest 12 x 6 mm perhaps better organized than on previous imaging.   On limited assessment no acute skeletal findings.   IMPRESSION: 1. Persistent elevation of the LEFT hemidiaphragm with basilar atelectasis. 2. Persistent nodular opacity along the minor fissure in the RIGHT chest. Given persistence and increased conspicuity would suggest CT of the chest for further evaluation to exclude discrete nodule.   These results will be called to the ordering clinician or representative by the Radiologist Assistant, and communication documented in the PACS or [employer].     Electronically Signed   By: Zetta Bills M.D.   On: 01/12/2022 13:39 I personally reviewed the chest x-ray images.  It is difficult to tell anything definitive about the nodule because of markings left by the  radiologist.  He provided me with the CT report from a CT scan on 12/14/2021 at the [facility]: A part solid nodule is again seen arising in the right superior segment abutting the right major fissure.  There is no significant changes overall size, although the solid component demonstrates significant interval enlargement, and a worsening invasive adenocarcinoma cannot be excluded.  Options for further evaluation include tissue sampling, PET/CT, and follow-up. No mediastinal or hilar lymphadenopathy.  Stable anterior mediastinal soft tissue density.  I do not have those images available for review.  Impression: Connor Duffy is a 81 year old man with a history of atrial fibrillation, DVT, hypertension, prostate cancer, arthritis, hyperlipidemia, CAD, CABG, and carcinoid tumor of the liver.  He presented in September 2022 with accelerating angina.  He underwent coronary bypass grafting x4 and left atrial appendage clipping on 03/26/2021.  He developed an elevated left hemidiaphragm after CABG.  A 12-monthfollow-up chest x-ray showed that was slightly improved particularly anteriorly.  He was noted to have a vague nodule in the right lower lobe.  On his chest x-ray today that area appears more organized according to the radiologist.  Because of markings on the chest x-ray, it is difficult for me to determine.  However, in the interim he also [place] [facility] [place] [facility] [place] [facility] [place] nodule in the superior segment of the right lower lobe.  Differential diagnosis includes infectious or inflammatory nodules, primary bronchogenic carcinoma, and carcinoid tumor.  Given the persistence of the nodule and the increase in the solid component, further investigation is warranted.  Consideration includes PET/CT versus a PET dotatate scan.  With his history of carcinoid tumors in the abdomen a dotatate scan would be reasonable.  However this would be a very unusual appearance for  metastatic carcinoid.  I really think the differential here is between infectious or inflammatory nodules versus a low-grade adenocarcinoma.  Therefore I think her best option is to do a conventional PET/CT.  Plan: PET/CT to guide initial diagnostic work-up of right lower lobe lung nodule  SMelrose Nakayama MD [facility] [phone]

## 2022-01-18 ENCOUNTER — Encounter (HOSPITAL_COMMUNITY)
Admission: RE | Admit: 2022-01-18 | Discharge: 2022-01-18 | Disposition: A | Discharge status: Home / Self Care | Payer: No Typology Code available for payment source | Source: Ambulatory Visit | Attending: Thoracic Surgery (Cardiothoracic Vascular Surgery) | Admitting: Thoracic Surgery (Cardiothoracic Vascular Surgery)

## 2022-01-18 DIAGNOSIS — R911 Solitary pulmonary nodule: Secondary | ICD-10-CM

## 2022-01-18 LAB — GLUCOSE, CAPILLARY: Glucose-Capillary: 102 mg/dL — ABNORMAL HIGH (ref 70–99)

## 2022-01-18 MED ORDER — PIFLIFOLASTAT F 18 (PYLARIFY) INJECTION: 9.0000 | Freq: Once | INTRAVENOUS | Status: DC

## 2022-01-18 MED ORDER — FLUDEOXYGLUCOSE F - 18 (FDG) INJECTION
8.9500 | Freq: Once | INTRAVENOUS | Status: AC | PRN
  Administered 2022-01-18: 8.95 via INTRAVENOUS

## 2022-02-03 ENCOUNTER — Encounter: Payer: Self-pay | Admitting: Thoracic Surgery (Cardiothoracic Vascular Surgery)

## 2022-02-09 ENCOUNTER — Encounter: Payer: Self-pay | Admitting: Thoracic Surgery (Cardiothoracic Vascular Surgery)

## 2022-02-09 ENCOUNTER — Ambulatory Visit (INDEPENDENT_AMBULATORY_CARE_PROVIDER_SITE_OTHER): Payer: No Typology Code available for payment source | Admitting: Thoracic Surgery (Cardiothoracic Vascular Surgery)

## 2022-02-09 VITALS — BP 110/65 | HR 103 | Resp 18 | Ht 71.0 in | Wt 181.0 lb

## 2022-02-09 DIAGNOSIS — R911 Solitary pulmonary nodule: Secondary | ICD-10-CM | POA: Diagnosis not present

## 2022-02-09 NOTE — Progress Notes (Signed)
[address]       ,[place] [address]             [phone]    HPI: Mr. Winchell returns to discuss the results of his PET/CT regarding a right lower lobe lung nodule.  Christopherjame Carnell is a 81 year old man with a history of coronary disease status post coronary bypass grafting, atrial fibrillation, DVT, hypertension, prostate cancer, arthritis, hyperlipidemia, and carcinoid tumor of the liver.  He had CABG x 4 and left atrial appendage clipping on 03/26/2021.  On a chest x-ray he was found to have a vague nodule in the right lower lobe.  He had a CT done at the [place] which showed a mixed density lesion with a significant increase in the solid component in the superior segment of the right lower lobe.  I recommended a PET/CT for further evaluation.  Continues to feel well.  Past Medical History:  Diagnosis Date   Arthritis    Atrial fibrillation ([place])    Cancer ([place]) 2002   PROSTATE   Coronary artery disease    Diverticulosis 12/23/2017   colonscopy [facility] - Dr. 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 Swallow whole. 30 tablet 11   CHOLECALCIFEROL PO Take 15,000 Units by mouth once a week.     dabigatran (PRADAXA) 150 MG CAPS capsule Take 150 mg 2 (two) times daily by mouth.     darolutamide (NUBEQA) 300 MG tablet Take 600 mg by mouth 2 (two) times daily.     gabapentin (NEURONTIN) 300 MG capsule Take 300 mg at bedtime by mouth.     Relugolix 120 MG TABS 120 mg.     Zoledronic Acid  4 MG SOLR Inject 3.5 mg into the vein every 3 (three) months.     zolpidem (AMBIEN) 5 MG tablet Take 5 mg by mouth at bedtime.     No current facility-administered medications for this visit.    Physical Exam BP 110/65 (BP Location: Right Arm, Patient Position: Sitting)   Pulse (!) 103   Resp 18   Ht '5\' 11"'$  (1.803 m)   Wt 181 lb (82.1 kg)   SpO2 94% Comment: RA  BMI 25.41 kg/m  81 year old man in no acute distress Alert and oriented x 3 with no focal deficits Well-developed and well-nourished  Diagnostic Tests: NUCLEAR MEDICINE PET SKULL BASE TO THIGH   TECHNIQUE: 8.95 mCi F-18 FDG was injected intravenously. Full-ring PET imaging was performed from the skull base to thigh after the radiotracer. CT data was obtained and used for attenuation correction and anatomic localization.   Fasting blood glucose: 102 mg/dl   COMPARISON:  MRI September 07, 2021, chest CT March 05, 2020 and PET-CT May 08, 2018   FINDINGS: Mediastinal blood pool activity: SUV max 2.2   Liver activity:  SUV max NA   NECK: No hypermetabolic cervical adenopathy.   Incidental CT findings: Streak artifact from dental hardware.   CHEST: Very low-level FDG avidity in the ground-glass right lower lobe pulmonary nodule which measures 13 mm on image 28/7 with a max SUV of 2.0.   Mildly metabolic prominent mediastinal lymph nodes with areas of hypermetabolic hilar nodal activity without discrete CT correlate. For reference:   -precarinal lymph node lymph node measures 7 mm in short axis on image 71/4 with a max SUV of 2.9.   -Right paratracheal lymph node measures 7 mm in short axis on image 64/4 with a max SUV of 4.5   Mildly metabolic consolidation in the left lower lobe on image 87/4 with a max SUV of 2.6 is most consistent with infectious/inflammatory etiology.   Incidental CT findings: Left atrial appendage occlusion device. Aortic atherosclerosis.   ABDOMEN/PELVIS: No abnormal  hypermetabolic activity within the liver, pancreas, adrenal glands, or spleen.   Mildly metabolic prominent left external iliac lymph node measures 9 mm on image 108/4 with a max SUV of 3.7.   Focus of intense hypermetabolic activity along the glands penis is secondary to urine contamination.   Incidental CT findings: Cholelithiasis without findings of acute cholecystitis. Prostatectomy and bilateral pelvic lymph node dissection. Penile prosthesis with pump in the scrotum and reservoir in the pelvis. Right testicle is in the inguinal canal. Aortic atherosclerosis with a 3.1 cm infrarenal abdominal aortic aneurysm.   SKELETON: No focal hypermetabolic activity to suggest skeletal metastasis.   Prior median sternotomy and CABG with linear bands of hypermetabolic activity along the sternal reflecting physiologic healing   Hypermetabolic linear sclerotic bands in the posterior bilateral first ribs with a max SUV of 3.2 on the right and 4.2 on the left common likely reflect sequela of healing fractures.   Incidental CT findings: Lumbosacral fusion hardware. Right total hip arthroplasty.   IMPRESSION: 1. Very low-level FDG activity in the 13 mm ground-glass right lower lobe pulmonary nodule, likely reflecting a benign pulmonary nodule. However, primary pulmonary adenocarcinoma can have this appearance with low FDG avidity, would attention on interval follow-up chest CT to assess stability. 2. Mildly metabolic prominent mediastinal and hilar lymph nodes are nonspecific but favored reactive, suggest attention on follow-up imaging. 3. Mildly metabolic left lower lobe consolidation is most consistent with an infectious or inflammatory etiology. 4. Prior prostatectomy and pelvic lymph node dissection. Single prominent mildly metabolic left external iliac lymph node is nonspecific possibly reactive. 5. Hypermetabolic linear bands in the posterior bilateral first ribs are favored to  reflect sequela of trauma. 6. Prior median sternotomy and CABG with linear bands of hypermetabolic activity along throughout the sternum reflecting physiologic healing. 7. 3.1 cm infrarenal abdominal aortic aneurysm. Recommend follow-up every 3 years. Reference: J Am Coll Radiol 9485;46:270-350. 8.  Aortic Atherosclerosis (ICD10-I70.0).     Electronically Signed   By: Dahlia Bailiff M.D.   On: 01/19/2022 16:50   I personally reviewed the PET/CT images.  There is minimal activity in the groundglass right lower lobe lung nodule.  There is very minimal solid component.  Some activity in the right hilum as well as small right paratracheal node with significant activity as well.  Overall I think this favors an inflammatory process.  Impression: Johnwilliam Shepperson is a 81 year old man with a history of coronary disease status post coronary bypass grafting, atrial fibrillation, DVT, hypertension, prostate cancer, arthritis, hyperlipidemia, and carcinoid tumor of the liver.    Right lower lobe  lung nodule-primarily groundglass with a very tiny solid component.  Faint uptake on PET/CT.  Cannot rule out a low-grade adenocarcinoma but this is not consistent with an aggressive lung cancer.  Appearance of the nodule is not consistent with the relatively high-grade activity and normal sized mediastinal nodes.  Suspect that represents an inflammatory process.  I reviewed the images with Mr. and Mrs. Louro and we discussed our options.  I do not think there is sufficient suspicion to warrant surgical resection at this time.  We could attempt a biopsy but I think a better option would be to continue to follow this radiographically.  He was scheduled to have a CT in September.  I told him he does not need another CT that soon.  I would wait at least 3 months from the PET.  He will talk to the people at the [place] about that.  I am happy to follow his lung nodule but I really need to see the scans.  If he wants to have the  scans done at the [place] needs to bring a disc so I can look at them personally.  He mentioned also having MR in September.  Maybe that is of the liver, hopefully is not of the lung for that would not be of any help but he was not sure.  It is comforting that the PET/CT did not show any significant issues with the liver.  Plan: Return in 3 months with CT chest  I spent over 20 minutes in review of records, images, and in consultation with Mr. Beswick today Melrose Nakayama, MD [facility] [phone]

## 2022-02-10 ENCOUNTER — Telehealth: Payer: Self-pay | Admitting: *Deleted

## 2022-02-10 ENCOUNTER — Encounter: Payer: Self-pay | Admitting: Thoracic Surgery (Cardiothoracic Vascular Surgery)

## 2022-02-10 NOTE — Telephone Encounter (Signed)
Per MyChart message, [facility]. Per patient request, future scans will no be scheduled by our office.

## 2022-03-09 ENCOUNTER — Other Ambulatory Visit: Payer: Self-pay

## 2022-03-09 ENCOUNTER — Emergency Department (HOSPITAL_COMMUNITY): Payer: No Typology Code available for payment source

## 2022-03-09 ENCOUNTER — Emergency Department (HOSPITAL_COMMUNITY)
Admission: EM | Admit: 2022-03-09 | Discharge: 2022-03-09 | Disposition: A | Discharge status: Home / Self Care | Payer: No Typology Code available for payment source | Attending: Emergency Medicine | Admitting: Emergency Medicine

## 2022-03-09 ENCOUNTER — Encounter (HOSPITAL_COMMUNITY): Payer: Self-pay | Admitting: Emergency Medicine

## 2022-03-09 DIAGNOSIS — Z7982 Long term (current) use of aspirin: Secondary | ICD-10-CM | POA: Diagnosis not present

## 2022-03-09 DIAGNOSIS — I480 Paroxysmal atrial fibrillation: Secondary | ICD-10-CM | POA: Insufficient documentation

## 2022-03-09 DIAGNOSIS — Z951 Presence of aortocoronary bypass graft: Secondary | ICD-10-CM | POA: Diagnosis not present

## 2022-03-09 DIAGNOSIS — I2510 Atherosclerotic heart disease of native coronary artery without angina pectoris: Secondary | ICD-10-CM | POA: Diagnosis not present

## 2022-03-09 DIAGNOSIS — R0602 Shortness of breath: Secondary | ICD-10-CM | POA: Diagnosis present

## 2022-03-09 LAB — BASIC METABOLIC PANEL
Anion gap: 8 (ref 5–15)
BUN: 32 mg/dL — ABNORMAL HIGH (ref 8–23)
CO2: 25 mmol/L (ref 22–32)
Calcium: 8.8 mg/dL — ABNORMAL LOW (ref 8.9–10.3)
Chloride: 105 mmol/L (ref 98–111)
Creatinine, Ser: 1.56 mg/dL — ABNORMAL HIGH (ref 0.61–1.24)
GFR, Estimated: 45 mL/min — ABNORMAL LOW (ref >60)
Glucose, Bld: 175 mg/dL — ABNORMAL HIGH (ref 70–99)
Potassium: 4.2 mmol/L (ref 3.5–5.1)
Sodium: 138 mmol/L (ref 135–145)

## 2022-03-09 LAB — CBC WITH DIFFERENTIAL/PLATELET
Abs Immature Granulocytes: 0.01 10*3/uL (ref 0.00–0.07)
Basophils Absolute: 0 10*3/uL (ref 0.0–0.1)
Basophils Relative: 1 %
Eosinophils Absolute: 0.1 10*3/uL (ref 0.0–0.5)
Eosinophils Relative: 2 %
HCT: 37.4 % — ABNORMAL LOW (ref 39.0–52.0)
Hemoglobin: 12.1 g/dL — ABNORMAL LOW (ref 13.0–17.0)
Immature Granulocytes: 0 %
Lymphocytes Relative: 23 %
Lymphs Abs: 1.3 10*3/uL (ref 0.7–4.0)
MCH: 30.6 pg (ref 26.0–34.0)
MCHC: 32.4 g/dL (ref 30.0–36.0)
MCV: 94.7 fL (ref 80.0–100.0)
Monocytes Absolute: 0.3 10*3/uL (ref 0.1–1.0)
Monocytes Relative: 5 %
Neutro Abs: 3.8 10*3/uL (ref 1.7–7.7)
Neutrophils Relative %: 69 %
Platelets: 221 10*3/uL (ref 150–400)
RBC: 3.95 MIL/uL — ABNORMAL LOW (ref 4.22–5.81)
RDW: 13.8 % (ref 11.5–15.5)
WBC: 5.6 10*3/uL (ref 4.0–10.5)
nRBC: 0 % (ref 0.0–0.2)

## 2022-03-09 LAB — TROPONIN I (HIGH SENSITIVITY): Troponin I (High Sensitivity): 17 ng/L (ref <18)

## 2022-03-09 LAB — MAGNESIUM: Magnesium: 2.1 mg/dL (ref 1.7–2.4)

## 2022-03-09 LAB — BRAIN NATRIURETIC PEPTIDE: B Natriuretic Peptide: 352.5 pg/mL — ABNORMAL HIGH (ref 0.0–100.0)

## 2022-03-09 MED ORDER — ETOMIDATE 2 MG/ML IV SOLN: 10.0000 mg | Freq: Once | INTRAVENOUS | Status: DC

## 2022-03-09 MED ORDER — DILTIAZEM HCL 30 MG PO TABS: 30.0000 mg | ORAL_TABLET | Freq: Two times a day (BID) | ORAL | 0 refills | Status: AC | PRN

## 2022-03-09 MED ORDER — ETOMIDATE 2 MG/ML IV SOLN: 15.0000 mg | Freq: Once | INTRAVENOUS | Status: DC

## 2022-03-09 NOTE — ED Triage Notes (Signed)
Pt arrived POV, c/o of dizziness and racing heart rate, palpations, CP/SOB started 1 week ago. Pt taking Pradaxa.

## 2022-03-09 NOTE — ED Notes (Signed)
Per Dr Darl Householder it is safe to d/c the patient without a second trop.

## 2022-03-09 NOTE — ED Provider Notes (Signed)
[facility] EMERGENCY DEPARTMENT Provider Note   CSN: [id number] Arrival date & time: 03/09/22  1434     History  Chief Complaint  Patient presents with   Irregular Heart Beat    Connor Duffy is a 81 y.o. male history of CAD status post CABG, atrial fibrillation on Pradaxa here presenting with palpitations and shortness of breath.  States that for the last week or so he has intermittent dizziness and palpitations.  He has a smart watch and noticed that his heart rate has been persistently elevated around 120-130.  Patient states that he required cardioversion several months ago for rapid A-fib.  He also had ablation previously but that was unsuccessful.  Denies any chest pain.  Patient's last meal was around 1 PM today.   The history is provided by the patient.       Home Medications Prior to Admission medications   Medication Sig Start Date End Date Taking? Authorizing Provider  diltiazem (CARDIZEM) 30 MG tablet Take 1 tablet (30 mg total) by mouth 2 (two) times daily as needed (heart rate > 130). 03/09/22  Yes Drenda Freeze, MD  aspirin EC 81 MG EC tablet Take 1 tablet (81 mg total) by mouth daily. Swallow whole. 04/03/21   Nani Skillern, PA-C  CHOLECALCIFEROL PO Take 15,000 Units by mouth once a week.    [provider]  dabigatran (PRADAXA) 150 MG CAPS capsule Take 150 mg 2 (two) times daily by mouth.    [provider]  darolutamide (NUBEQA) 300 MG tablet Take 600 mg by mouth 2 (two) times daily.    [provider]  gabapentin (NEURONTIN) 300 MG capsule Take 300 mg at bedtime by mouth.    [provider]  Relugolix 120 MG TABS 120 mg. 07/01/21   [provider]  Zoledronic Acid 4 MG SOLR Inject 3.5 mg into the vein every 3 (three) months. 06/03/20   [provider]  zolpidem (AMBIEN) 5 MG tablet Take 5 mg by mouth at bedtime. 12/24/20   [provider]      Allergies    Protective  adhesive powder and Wound dressing adhesive    Review of Systems   Review of Systems  Cardiovascular:  Positive for palpitations.  All other systems reviewed and are negative.   Physical Exam Updated Vital Signs BP (!) 139/92   Pulse 67   Temp 98.5 F (36.9 C)   Resp 12   Ht '5\' 11"'$  (1.803 m)   Wt 81.6 kg   SpO2 100%   BMI 25.10 kg/m  Physical Exam Vitals and nursing note reviewed.  Constitutional:      Appearance: Normal appearance.  HENT:     Head: Normocephalic.     Nose: Nose normal.     Mouth/Throat:     Mouth: Mucous membranes are moist.  Eyes:     Extraocular Movements: Extraocular movements intact.     Pupils: Pupils are equal, round, and reactive to light.  Cardiovascular:     Rate and Rhythm: Normal rate and regular rhythm.  Pulmonary:     Effort: Pulmonary effort is normal.     Breath sounds: Normal breath sounds.  Abdominal:     General: Abdomen is flat.     Palpations: Abdomen is soft.  Musculoskeletal:        General: Normal range of motion.     Cervical back: Normal range of motion and neck supple.  Skin:    General: Skin  is warm.     Capillary Refill: Capillary refill takes less than 2 seconds.  Neurological:     General: No focal deficit present.     Mental Status: He is alert and oriented to person, place, and time.  Psychiatric:        Mood and Affect: Mood normal.        Behavior: Behavior normal.     ED Results / Procedures / Treatments   Labs (all labs ordered are listed, but only abnormal results are displayed) Labs Reviewed  CBC WITH DIFFERENTIAL/PLATELET - Abnormal; Notable for the following components:      Result Value   RBC 3.95 (*)    Hemoglobin 12.1 (*)    HCT 37.4 (*)    All other components within normal limits  BASIC METABOLIC PANEL - Abnormal; Notable for the following components:   Glucose, Bld 175 (*)    BUN 32 (*)    Creatinine, Ser 1.56 (*)    Calcium 8.8 (*)    GFR, Estimated 45 (*)    All other components  within normal limits  BRAIN NATRIURETIC PEPTIDE - Abnormal; Notable for the following components:   B Natriuretic Peptide 352.5 (*)    All other components within normal limits  MAGNESIUM  TROPONIN I (HIGH SENSITIVITY)  TROPONIN I (HIGH SENSITIVITY)    EKG EKG Interpretation  Date/Time:  Tuesday March 09 2022 18:43:58 EDT Ventricular Rate:  69 PR Interval:  343 QRS Duration: 103 QT Interval:  405 QTC Calculation: 434 R Axis:   -32 Text Interpretation: Sinus rhythm Prolonged PR interval Incomplete RBBB and LAFB RSR' in V1 or V2, right VCD or RVH previous tracing showed afib Confirmed by Wandra Arthurs [phone]) on 03/09/2022 6:51:38 PM  Radiology DG Chest 2 View  Result Date: 03/09/2022 CLINICAL DATA:  sob, afib EXAM: CHEST - 2 VIEW COMPARISON:  Chest x-ray January 12, 2022. FINDINGS: milar versus slightly increased size of a pulmonary nodule in the right midlung. No consolidation. No visible pleural effusions or pneumothorax. Chronically elevated left hemidiaphragm with overlying mild subsegmental atelectasis. Cardiomediastinal silhouette is unchanged. Loop recorder in left atrial appendage clip. CABG and median sternotomy. IMPRESSION: Similar versus slightly increased size of a pulmonary nodule in the right midlung. A follow-up chest CT is recommended for more accurate assessment of size/instability. Electronically Signed   By: Margaretha Sheffield M.D.   On: 03/09/2022 15:19    Procedures Procedures    Medications Ordered in ED Medications  etomidate (AMIDATE) injection 10 mg (10 mg Intravenous Not Given 03/09/22 1907)    ED Course/ Medical Decision Making/ A&P                           Medical Decision Making Connor Duffy is a 81 y.o. male here presenting with palpitations and A-fib.  Patient initially was in rapid A-fib.  His heart rate was in the 120s.  However after he got back to the room, his heart rate is down to the 70s.  I got a repeat EKG and noticed that he is back in sinus  rhythm.  I initially plan to perform cardioversion but since he converted back to sinus rhythm, I do not think that cardioversion will benefit him.  We had a long discussion about what to do with his paroxysmal A-fib.  I recommend continue Pradaxa.  He is not on any rate control agent.  I recommend Cardizem 30 mg twice daily as needed.  I told him to follow-up with his cardiologist.   Problems Addressed: Paroxysmal atrial fibrillation [facility]): acute illness or injury  Amount and/or Complexity of Data Reviewed Labs: ordered. Decision-making details documented in ED Course. Radiology: ordered and independent interpretation performed. Decision-making details documented in ED Course. ECG/medicine tests: ordered and independent interpretation performed. Decision-making details documented in ED Course.  Risk Prescription drug management.    Final Clinical Impression(s) / ED Diagnoses Final diagnoses:  Paroxysmal atrial fibrillation ([place])    Rx / DC Orders ED Discharge Orders          Ordered    diltiazem (CARDIZEM) 30 MG tablet  2 times daily PRN        03/09/22 1907              Drenda Freeze, MD 03/09/22 [phone]

## 2022-03-09 NOTE — ED Provider Triage Note (Signed)
Emergency Medicine Provider Triage Evaluation Note  Connor Duffy , a 81 y.o. male  was evaluated in triage.  Pt complains of shortness of breath and palpitations with lightheadedness and dizziness.  Patient reports that he has been back in A-fib for the past week.  Patient history of paroxysmal A-fib despite multiple rounds of cardioversions.  Patient reports his last cardioversion was 7 months ago.  Patient is on blood thinners.  Denies chest pain..  Review of Systems  Positive: Lightheadedness, dizziness, palpitations, shortness of breath Negative: Chest pain, fevers, chills, leg swelling  Physical Exam  BP (!) 144/106 (BP Location: Left Arm)   Pulse 70   Temp 98.2 F (36.8 C) (Oral)   Resp 20   Ht '5\' 11"'$  (1.803 m)   Wt 81.6 kg   SpO2 99%   BMI 25.10 kg/m  Gen:   Awake, no distress   Resp:  Normal effort  MSK:   Moves extremities without difficulty  Other:  Patient with irregularly irregular rhythm tachycardia lungs clear to auscultation bilaterally.  Radial pulses are 2+.  No leg swelling.  Medical Decision Making  Medically screening exam initiated at 2:46 PM.  Appropriate orders placed.  Connor Duffy was informed that the remainder of the evaluation will be completed by another provider, this initial triage assessment does not replace that evaluation, and the importance of remaining in the ED until their evaluation is complete.  81 year old presents for A-fib.  EKG shows patient is with A-fib with RVR.  Patient reports shortness of breath.  Denies chest pain.  Otherwise vital signs are reassuring.  Patient will need lab work and imaging.  History of A-fib in the past that is required multiple rounds of cardioversions.  Patient is on blood thinners.   Connor Devoid, PA-C 03/09/22 1449

## 2022-03-09 NOTE — Discharge Instructions (Addendum)
You have paroxysmal atrial fibrillation.  Please continue taking her current meds including her Pradaxa.  As we discussed, if your heart rate is greater than 130 for over an hour, you can take Cardizem 30 mg.  Please wait for several hours and if you are heart rate is still greater than 100, please return to the ER.  You need to follow-up with your cardiologist  Return to ER if you have worse palpitations, chest pain, shortness of breath and dizziness

## 2022-04-15 ENCOUNTER — Encounter: Payer: Self-pay | Admitting: Thoracic Surgery (Cardiothoracic Vascular Surgery)

## 2022-08-28 IMAGING — CT CT CHEST W/O CM
2 of 6 series · 14 of 36 positions shown, 17 images · non-contrast
Comparison: PET-CT 05/08/2018 (report only), esophagram 05/31/2018,
chest radiograph 04/04/2015

CLINICAL DATA: Fall, syncopal episode, left lower chest pain

EXAM:
CT CHEST WITHOUT CONTRAST
TECHNIQUE: Multidetector CT imaging of the chest was performed following the
standard protocol without IV contrast.

[Series 7: thorax · axial · 0.91mm/px · z∈[+690,+1070]mm · 12 of 210 slices shown, 15 images]
[im 10/210  mediastinal]
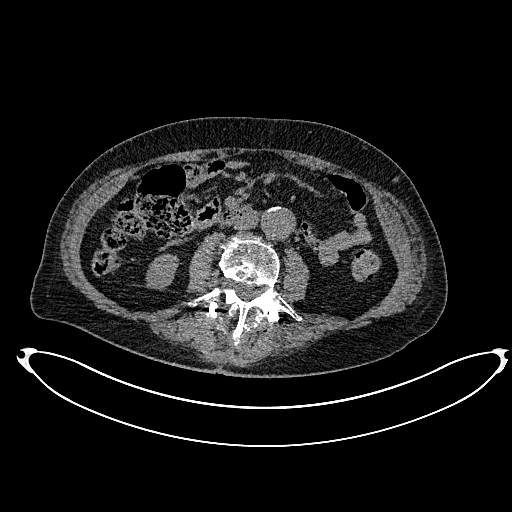
[im 10/210  lung]
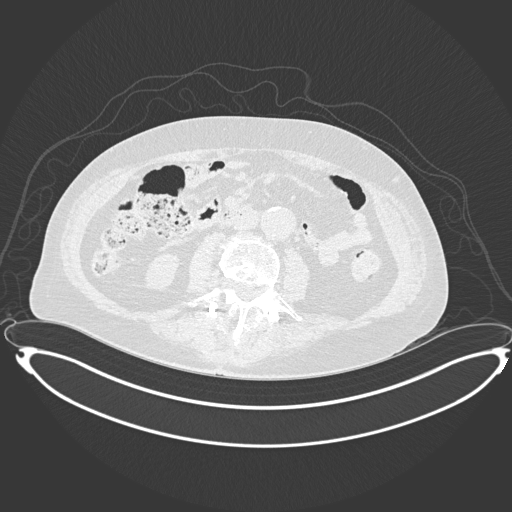
[im 28/210  lung]
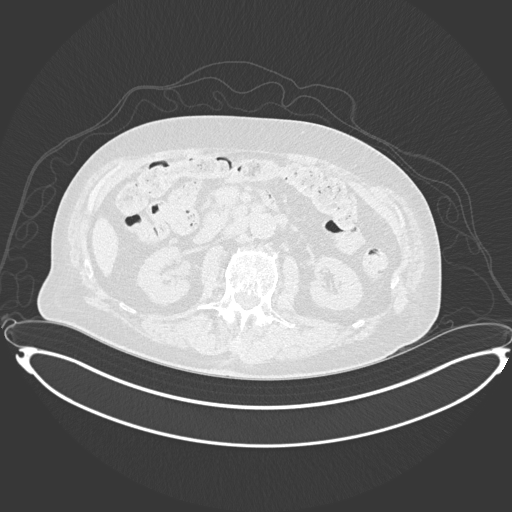
[im 46/210  lung]
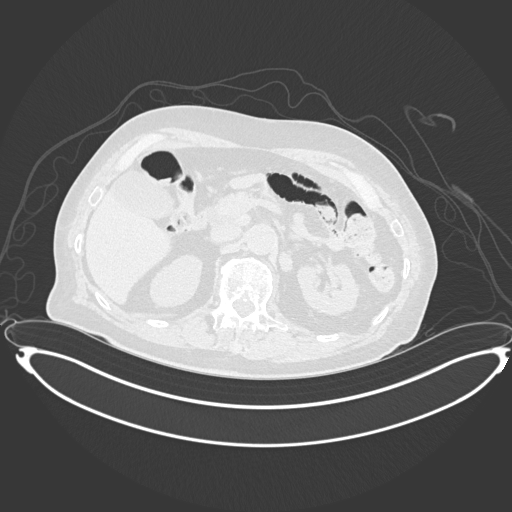
[im 64/210  lung]
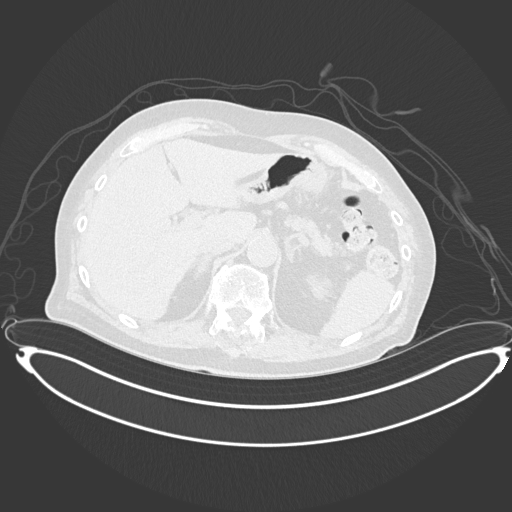
[im 82/210  mediastinal]
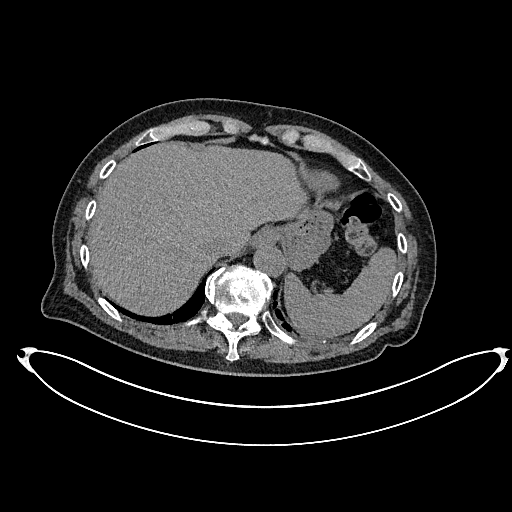
[im 82/210  lung]
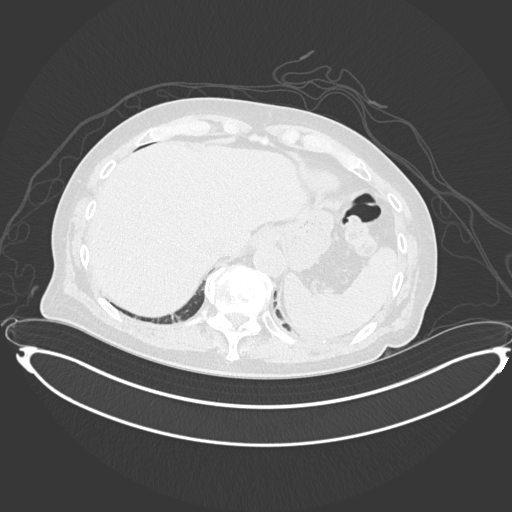
[im 100/210  lung]
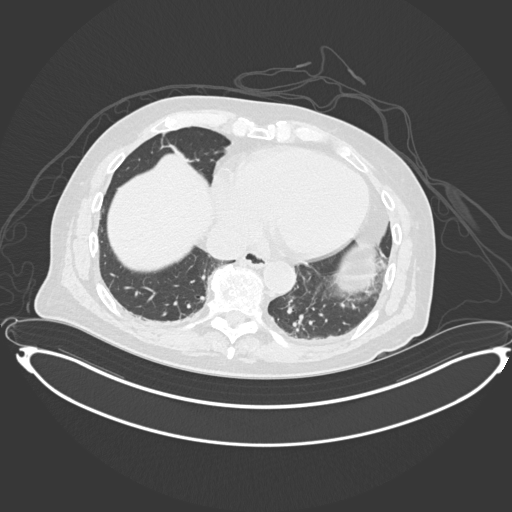
[im 110/210  lung]
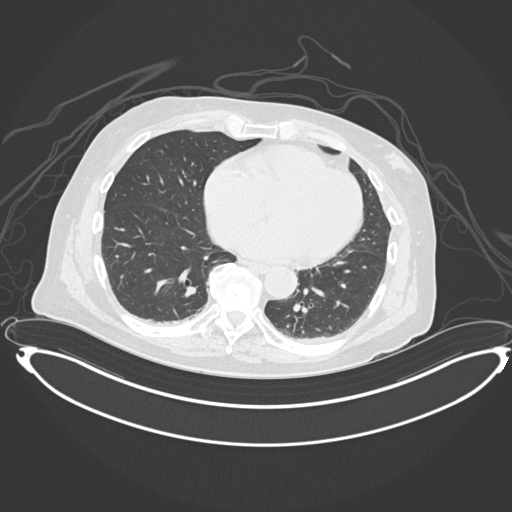
[im 128/210  lung]
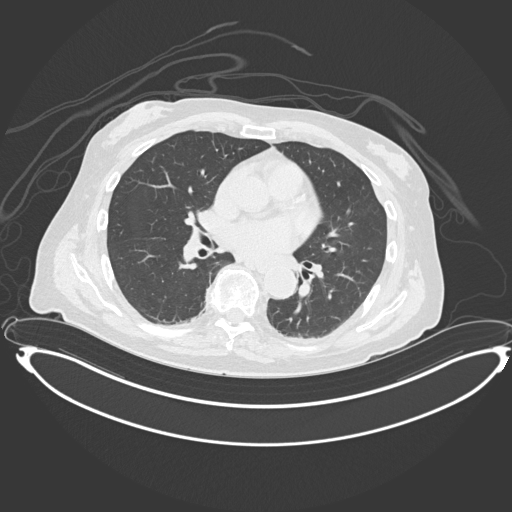
[im 146/210  mediastinal]
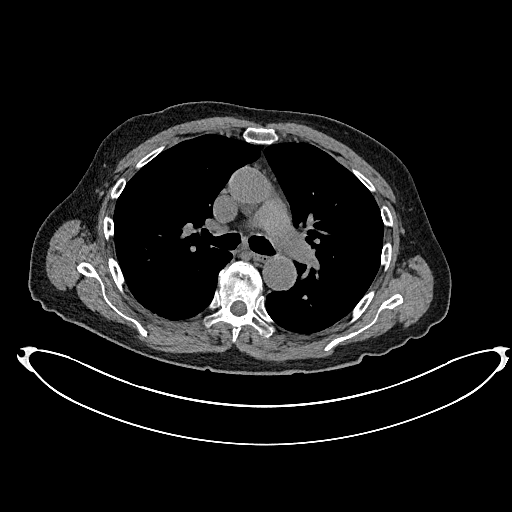
[im 146/210  lung]
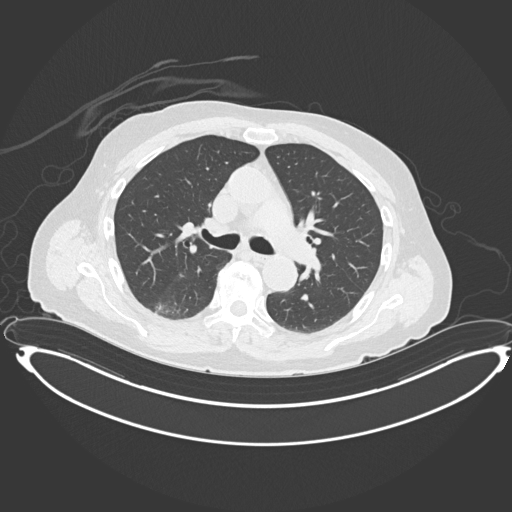
[im 164/210  lung]
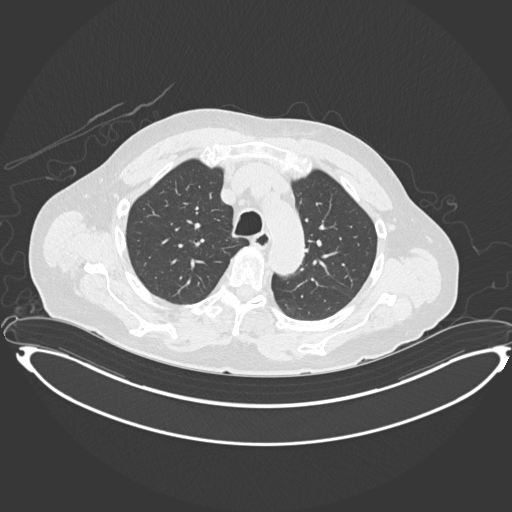
[im 182/210  lung]
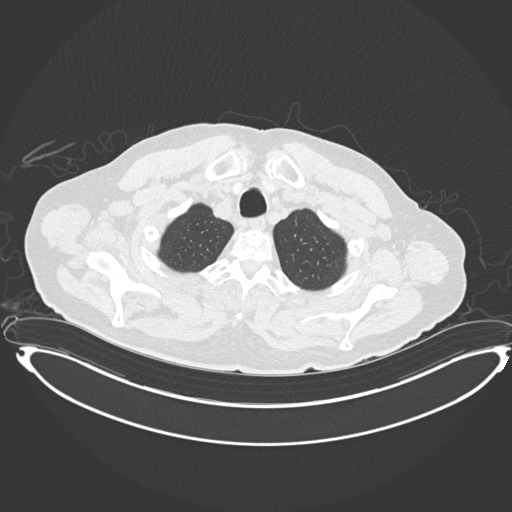
[im 200/210  lung]
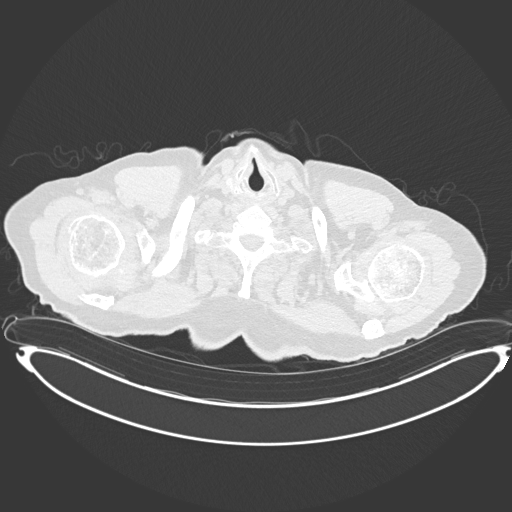

[Series 10: coronal · coronal · 0.82mm/px · 2 of 146 slices shown]
[im 49/146  lung]
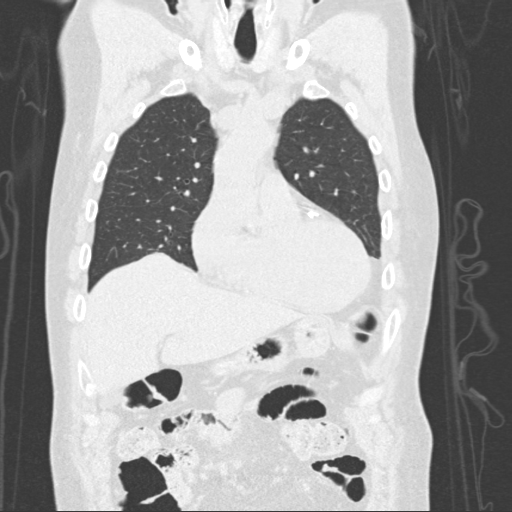
[im 97/146  lung]
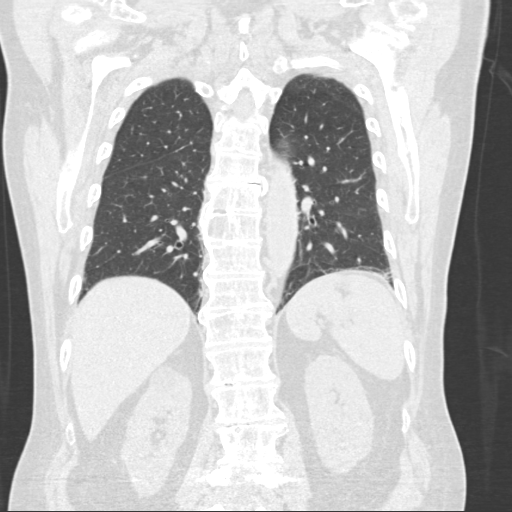

[14 of 36 positions shown; findings below may reference images not displayed]

FINDINGS: Cardiovascular: Limited evaluation the absence of contrast media.
Minimal plaque in the normal caliber thoracic aorta. No significant
periaortic stranding or hemorrhage. No abnormal hyperdense mural
thickening or plaque displacement. Normal 3 vessel branching of the
aortic arch. Proximal great vessels are grossly unremarkable.
Central pulmonary arteries are normal caliber. Normal heart size. No
pericardial effusion. Three-vessel coronary artery disease is noted.

Mediastinum/Nodes: No mediastinal fluid or gas. Normal thyroid gland
and thoracic inlet. No acute abnormality of the trachea or
esophagus. No worrisome mediastinal or axillary adenopathy. Hilar
nodal evaluation is limited in the absence of intravenous contrast
media.

Lungs/Pleura: Atelectatic changes are present in the left lung base
and lingula which may reflect some subsegmental atelectasis related
to the splinting/chest pain. No clear acute traumatic abnormality of
the lung parenchyma is seen. No visible effusion or pneumothorax.
Few calcified pleural plaques are present, nonspecific. No
suspicious pulmonary nodules or masses.

Upper Abdomen: Multiple enlarged retroperitoneal and mesenteric
nodes are noted including a 15 mm central mesenteric node (7/185)
and day 13 mm aortocaval node (7/188). No acute traumatic
abnormalities are evident in the upper abdomen within the
limitations of this noncontrast CT exam.

Musculoskeletal: Conspicuous lucency and cortical step-off noted
about the medial head left clavicle which could reflect a minimally
displaced fracture. Additional minimally displaced left fifth
through seventh ribs laterally as well as nondisplaced eighth and
ninth lateral rib fractures as well. Suspect a remote right
clavicular fracture as well. Background of degenerative changes in
the shoulders and thoracic spine. Lumbar fusion only partially
visualized on this exam and incompletely assessed. Moderate
bilateral gynecomastia is noted.
IMPRESSION: 1. Conspicuous lucency and cortical step-off about the medial head
left clavicle which could reflect a minimally displaced fracture.
Correlate for point tenderness.
2. Additional displaced left fifth through seventh ribs laterally
and nondisplaced left eighth and ninth lateral rib fractures.
3. No clear acute traumatic abnormality of the lung parenchyma. No
visible effusion or pneumothorax.
4. Atelectatic changes throughout both lungs, more pronounced in the
left lung base, possibly related to pain/splinting.
5. Multiple enlarged nodes seen in the upper abdomen including
enlarged retroperitoneal adenopathy. Correlate with patient
treatment history is prior PET-CT was performed for prostate cancer
and mesenteric adenopathy with nodes in this region reportedly
demonstrating some low-grade min folic activity. Pattern of disease
could suggest an atypical spread of patient's known prostate cancer
or a lymphoproliferative disorder.
6. Three-vessel coronary artery disease.
7. Few calcified pleural plaques, may reflect prior asbestos related
exposure.
8. Aortic Atherosclerosis ([id number]).

## 2023-10-19 DIAGNOSIS — D044 Carcinoma in situ of skin of scalp and neck: Secondary | ICD-10-CM | POA: Diagnosis not present

## 2023-10-19 DIAGNOSIS — L821 Other seborrheic keratosis: Secondary | ICD-10-CM | POA: Diagnosis not present

## 2023-10-19 DIAGNOSIS — Z85828 Personal history of other malignant neoplasm of skin: Secondary | ICD-10-CM | POA: Diagnosis not present

## 2023-10-19 DIAGNOSIS — L578 Other skin changes due to chronic exposure to nonionizing radiation: Secondary | ICD-10-CM | POA: Diagnosis not present

## 2023-10-19 DIAGNOSIS — D225 Melanocytic nevi of trunk: Secondary | ICD-10-CM | POA: Diagnosis not present

## 2023-10-19 DIAGNOSIS — D224 Melanocytic nevi of scalp and neck: Secondary | ICD-10-CM | POA: Diagnosis not present

## 2023-10-19 DIAGNOSIS — D0472 Carcinoma in situ of skin of left lower limb, including hip: Secondary | ICD-10-CM | POA: Diagnosis not present

## 2023-10-19 DIAGNOSIS — D2272 Melanocytic nevi of left lower limb, including hip: Secondary | ICD-10-CM | POA: Diagnosis not present

## 2023-10-19 DIAGNOSIS — D485 Neoplasm of uncertain behavior of skin: Secondary | ICD-10-CM | POA: Diagnosis not present

## 2023-10-19 DIAGNOSIS — L570 Actinic keratosis: Secondary | ICD-10-CM | POA: Diagnosis not present

## 2023-10-19 DIAGNOSIS — Z86018 Personal history of other benign neoplasm: Secondary | ICD-10-CM | POA: Diagnosis not present

## 2023-11-21 ENCOUNTER — Ambulatory Visit: Admitting: Podiatry

## 2024-01-25 DIAGNOSIS — D044 Carcinoma in situ of skin of scalp and neck: Secondary | ICD-10-CM | POA: Diagnosis not present

## 2024-04-12 ENCOUNTER — Encounter (HOSPITAL_BASED_OUTPATIENT_CLINIC_OR_DEPARTMENT_OTHER): Payer: Self-pay | Admitting: Internal Medicine

## 2024-04-12 DIAGNOSIS — G4700 Insomnia, unspecified: Secondary | ICD-10-CM

## 2024-04-12 DIAGNOSIS — G4733 Obstructive sleep apnea (adult) (pediatric): Secondary | ICD-10-CM

## 2024-06-05 ENCOUNTER — Ambulatory Visit (HOSPITAL_BASED_OUTPATIENT_CLINIC_OR_DEPARTMENT_OTHER): Admitting: Internal Medicine

## 2024-06-05 DIAGNOSIS — G4733 Obstructive sleep apnea (adult) (pediatric): Secondary | ICD-10-CM | POA: Insufficient documentation

## 2024-06-05 DIAGNOSIS — G4700 Insomnia, unspecified: Secondary | ICD-10-CM | POA: Insufficient documentation

## 2024-06-09 DIAGNOSIS — G4733 Obstructive sleep apnea (adult) (pediatric): Secondary | ICD-10-CM | POA: Diagnosis not present

## 2024-06-09 NOTE — Procedures (Signed)
 [facility] [address] [place], [place] [address] Tel: [phone]   Fax: [phone]  Polysomnography Interpretation  Patient Name:  Connor Duffy, Connor Duffy Study Date:  06/05/2024 Referring Physician:  JERELD BOOS [phone]) %%startinterp%% Indications for Polysomnography The patient is a 83 year-old Male who is 5' 11 and weighs 185.0 lbs. His BMI equals 25.9.  A full night polysomnogram was performed to evaluate for -.OSA  Medications were reported taken at 9:20 pm.  Tylenol   Zolpidem   Gabapentin    Polysomnogram Data A full night polysomnogram recorded the standard physiologic parameters including EEG, EOG, EMG, EKG, nasal and oral airflow.  Respiratory parameters of chest and abdominal movements were recorded with Respiratory Inductance Plethysmography belts.  Oxygen saturation was recorded by pulse oximetry.   Sleep Architecture The total recording time of the polysomnogram was 438.2 minutes.  The total sleep time was 162.5 minutes.  The patient spent 35.4% of total sleep time in Stage N1, 64.6% in Stage N2, 0.0% in Stages N3, and 0.0% in REM.  Sleep latency was 107.6 minutes.  REM latency was - minutes.  Sleep Efficiency was 37.1%.  Wake after Sleep Onset time was 168.0 minutes.  Respiratory Events The polysomnogram revealed a presence of 17 obstructive, 2 central, and - mixed apneas resulting in an Apnea index of 7.0 events per hour.  There were 125 hypopneas (>=3% desaturation and/or arousal) resulting in an Apnea\Hypopnea Index (AHI >=3% desaturation and/or arousal) of 53.2 events per hour.  There were 85 hypopneas (>=4% desaturation) resulting in an Apnea\Hypopnea Index (AHI >=4% desaturation) of 38.4 events per hour.  There were 22 Respiratory Effort Related Arousals resulting in a RERA index of 8.1 events per hour. The Respiratory Disturbance Index is 61.3 events per hour.  The snore index was - events per hour.  Mean oxygen saturation  was 95.6%.  The lowest oxygen saturation during sleep was 84.0%.  Time spent <=88% oxygen saturation was 1.7 minutes (0.4%).  Limb Activity There were 25 total limb movements recorded, of this total, 25 were classified as PLMs.  PLM index was 9.2 per hour and PLM associated with Arousals index was 3.3 per hour.  Cardiac Summary The average pulse rate was 74.5 bpm.  The minimum pulse rate was 50.0 bpm while the maximum pulse rate was 96.0 bpm.  Cardiac rhythm was abnormal- appears to be intermittently paced, occasional PVCs...  Comments: Severe obstructive sleep apnea, AHI (4%) 38.4/hr. Snoring with oxygen desaturation nadir 84%, mean 95.6%.  Diagnosis: Obstructive sleep apnea  Recommendations: Suggest autopap, CPAP titration sleep study or ENT surgical evaluation.   This study was personally reviewed and electronically signed by: Dr. Reggy Salt Accredited Board Certified in Sleep Medicine Date/Time: 06/09/24   2:14    %%endinterp%%   Diagnostic PSG Report  Patient Name: Duffy, Connor Study Date: 06/05/2024  Date of Birth: [birth year] Study Type: Diagnostic  Age: 19 year MRN #: [id number]  Sex: Male Interpreting Physician: SALT REGGY, [id number]  Height: 5' 11 Referring Physician: JERELD BOOS [phone])  Weight: 185.0 lbs Recording Tech: Orie Sires RRT RPSGT RST  BMI: 25.9 Scoring Tech: Orie Sires RRT RPSGT RST  ESS: 6 Neck Size: 15   Study Overview  Lights Off: 09:42:26 PM  Count Index  Lights On: 05:00:37 AM Awakenings: 46 17.0  Time in Bed: 438.2 min. Arousals: 150 55.4  Total Sleep Time: 162.5 min. AHI (>=3% Desat and/or Ar.): 144 53.2   Sleep Efficiency: 37.1% AHI (>=4%  Desat): 104 38.4   Sleep Latency: 107.6 min. Limb Movements: 25 9.2  Wake After Sleep Onset: 168.0 min. Snore: - -  REM Latency from Sleep Onset: - min. Desaturations: 141 52.1     Minimum SpO2 TST: 84.0%    Sleep Architecture  % of Time in Bed Stages Time (mins) % Sleep Time  Wake 276.5    Stage N1 57.5 35.4%  Stage N2 105.0 64.6%  Stage N3 0.0 0.0%  REM 0.0 0.0%   Arousal Summary   NREM REM Sleep Index  Respiratory Arousals 55 - 55 20.3  PLM Arousals 9 - 9 3.3  Isolated Limb Movement Arousals - - - -  Snore Arousals - - - -  Spontaneous Arousals 86 - 86 31.8  Total 150 - 150 55.4   Limb Movement Summary   Count Index  Isolated Limb Movements - -  Periodic Limb Movements (PLMs) 25 9.2  Total Limb Movements 25 9.2    Respiratory Summary   By Sleep Stage By Body Position Total   NREM REM Supine Non-Supine   Time (min) 162.5 0.0 3.0 159.5 162.5         Obstructive Apnea 17 - 2 15 17   Mixed Apnea - - - - -  Central Apnea 2 - - 2 2  Total Apneas 19 - 2 17 19   Total Apnea Index 7.0 - 40.0 6.4 7.0         Hypopneas (>=3% Desat and/or Ar.) 125 - 3 122 125  AHI (>=3% Desat and/or Ar.) 53.2 - 100.0 52.3 53.2         Hypopneas (>=4% Desat) 85 - - 85 85  AHI (>=4% Desat) 38.4 - 40.0 38.4 38.4          RERAs 22 - 1 21 22   RERA Index 8.1 - 20.0 7.9 8.1         RDI 61.3 - 120.0 60.2 61.3    Respiratory Event Type Index  Central Apneas 0.7  Obstructive Apneas 6.3  Mixed Apneas -  Central Hypopneas -  Obstructive Hypopneas 46.2  Central Apnea + Hypopnea (CAHI) 0.7  Obstructive Apnea + Hypopnea (OAHI) 52.4   Respiratory Event Durations   Apnea Hypopnea   NREM REM NREM REM  Average (seconds) 23.6 - 28.8 -  Maximum (seconds) 33.8 - 47.6 -    Oxygen Saturation Summary   Wake NREM REM TST TIB  Average SpO2 (%) 96.0% 94.9% - 94.9% 95.6%  Minimum SpO2 (%) 86.0% 84.0% - 84.0% 84.0%  Maximum SpO2 (%) 100.0% 99.0% - 99.0% 100.0%   Oxygen Saturation Distribution  Range (%) Time in range (min) Time in range (%)  90.0 - 100.0 418.1 98.6%  80.0 - 90.0 6.1 1.4%  70.0 - 80.0 - -  60.0 - 70.0 - -  50.0 - 60.0 - -  0.0 - 50.0 - -  Time Spent <=88% SpO2  Range (%) Time in range (min) Time in range (%)  0.0 - 88.0 1.7 0.4%      Count Index   Desaturations 141 52.1    Cardiac Summary   Wake NREM REM Sleep Total  Average Pulse Rate (BPM) 75.3 73.5 - 73.5 74.5  Minimum Pulse Rate (BPM) 50.0 51.0 - 51.0 50.0  Maximum Pulse Rate (BPM) 96.0 96.0 - 96.0 96.0   Pulse Rate Distribution:  Range (bpm) Time in range (min) Time in range (%)  0.0 - 40.0 - -  40.0 - 60.0 1.4 0.3%  60.0 -  80.0 342.0 80.9%  80.0 - 100.0 40.5 9.6%  100.0 - 120.0 - -  120.0 - 140.0 - -  140.0 - 200.0 - -      Hypnograms                      Technologist Comments  Patient was ordered as a NPSG. Patient is an 83 year old [place] male who was sent to the sleep center for OSA. No oxygen was applied. Patient reported taking his medications at 9:20 pm. Occasional Plm's/Plma's were noted. Questionable cardiac arrhythmias were noted throughout the night see epochs for examples: 57, 88, 122, 300, 435, 535, 600 etc. Also, patient has a pacemaker implanted. Study was performed in room # 3. Light to Moderate snoring was noted during the night. No restroom visit was noted.                          Reggy Salt Diplomate, Biomedical Engineer of Sleep Medicine  ELECTRONICALLY SIGNED ON:  06/09/2024, 2:07 PM [facility] PH: [phone]   FX: [phone] ACCREDITED BY THE AMERICAN ACADEMY OF SLEEP MEDICINE

## 2024-06-09 NOTE — Procedures (Signed)
  Indications for Polysomnography The patient is a 83 year-old Male who is 5' 11 and weighs 185.0 lbs. His BMI equals 25.9.  A full night polysomnogram was performed to evaluate for -.  Medications were reported taken at 9:20 pm.TylenolZolpidemGabapentin Polysomnogram Data A full night polysomnogram recorded the standard physiologic parameters including EEG, EOG, EMG, EKG, nasal and oral airflow.  Respiratory parameters of chest and abdominal movements were recorded with Respiratory Inductance Plethysmography belts.   Oxygen saturation was recorded by pulse oximetry.  Sleep Architecture The total recording time of the polysomnogram was 438.2 minutes.  The total sleep time was 162.5 minutes.  The patient spent 35.4% of total sleep time in Stage N1, 64.6% in Stage N2, 0.0% in Stages N3, and 0.0% in REM.  Sleep latency was 107.6 minutes.   REM latency was - minutes.  Sleep Efficiency was 37.1%.  Wake after Sleep Onset time was 168.0 minutes.  Respiratory Events The polysomnogram revealed a presence of 17 obstructive, 2 central, and - mixed apneas resulting in an Apnea index of 7.0 events per hour.  There were 125 hypopneas (GreaterEqual to3% desaturation and/or arousal) resulting in an Apnea\Hypopnea Index (AHI  GreaterEqual to3% desaturation and/or arousal) of 53.2 events per hour.  There were 85 hypopneas (GreaterEqual to4% desaturation) resulting in an Apnea\Hypopnea Index (AHI GreaterEqual to4% desaturation) of 38.4 events per hour.  There were 22  Respiratory Effort Related Arousals resulting in a RERA index of 8.1 events per hour. The Respiratory Disturbance Index is 61.3 events per hour.  The snore index was - events per hour.  Mean oxygen saturation was 95.6%.  The lowest oxygen saturation during sleep was 84.0%.  Time spent LessEqual to88% oxygen saturation was  minutes ().  Limb Activity There were 25 total limb movements recorded, of this total, 25 were classified as PLMs.  PLM index was  9.2 per hour and PLM associated with Arousals index was 3.3 per hour.  Cardiac Summary The average pulse rate was 74.5 bpm.  The minimum pulse rate was 50.0 bpm while the maximum pulse rate was 96.0 bpm.  Cardiac rhythm was normal/abnormal.  Comments:  Diagnosis:  Recommendations:   This study was personally reviewed and electronically signed by: Dr. Reggy Salt Accredited Board Certified in Sleep Medicine Date/Time:

## 2024-08-08 ENCOUNTER — Telehealth (HOSPITAL_COMMUNITY): Payer: Self-pay

## 2024-08-08 NOTE — Telephone Encounter (Signed)
 Outside/paper referral received by Dr. Zulema from [facility]. Will fax over request for EKG. Insurance benefits and eligibility to be determined.   Spoke with patient's wife (DPR on file), they are interested in the program. Patient has radiation treatments this month and should be done by the end of the month. Informed her of referral process and that patient needs cardiologist clearance to start program.  Patient wants to use [employer] authorization, informed her they will need to reach out to [facility] to request it.
